# Patient Record
Sex: Male | Born: 1940
Health system: Southern US, Community
[De-identification: ages and names within clinical notes are randomized; demographics above are authoritative.]

## PROBLEM LIST (undated history)

## (undated) DIAGNOSIS — G4730 Sleep apnea, unspecified: Secondary | ICD-10-CM

## (undated) DIAGNOSIS — K649 Unspecified hemorrhoids: Secondary | ICD-10-CM

## (undated) DIAGNOSIS — E7800 Pure hypercholesterolemia, unspecified: Secondary | ICD-10-CM

## (undated) DIAGNOSIS — Z9889 Other specified postprocedural states: Secondary | ICD-10-CM

## (undated) DIAGNOSIS — K449 Diaphragmatic hernia without obstruction or gangrene: Secondary | ICD-10-CM

## (undated) DIAGNOSIS — K439 Ventral hernia without obstruction or gangrene: Secondary | ICD-10-CM

## (undated) DIAGNOSIS — R109 Unspecified abdominal pain: Secondary | ICD-10-CM

## (undated) DIAGNOSIS — J329 Chronic sinusitis, unspecified: Secondary | ICD-10-CM

## (undated) DIAGNOSIS — I10 Essential (primary) hypertension: Secondary | ICD-10-CM

## (undated) DIAGNOSIS — M1990 Unspecified osteoarthritis, unspecified site: Secondary | ICD-10-CM

## (undated) DIAGNOSIS — K635 Polyp of colon: Secondary | ICD-10-CM

## (undated) DIAGNOSIS — I442 Atrioventricular block, complete: Secondary | ICD-10-CM

## (undated) DIAGNOSIS — R413 Other amnesia: Secondary | ICD-10-CM

## (undated) DIAGNOSIS — K219 Gastro-esophageal reflux disease without esophagitis: Secondary | ICD-10-CM

## (undated) DIAGNOSIS — H3530 Unspecified macular degeneration: Secondary | ICD-10-CM

## (undated) DIAGNOSIS — I2510 Atherosclerotic heart disease of native coronary artery without angina pectoris: Secondary | ICD-10-CM

## (undated) DIAGNOSIS — R7302 Impaired glucose tolerance (oral): Secondary | ICD-10-CM

## (undated) DIAGNOSIS — R9439 Abnormal result of other cardiovascular function study: Secondary | ICD-10-CM

## (undated) DIAGNOSIS — Z950 Presence of cardiac pacemaker: Secondary | ICD-10-CM

## (undated) DIAGNOSIS — K5790 Diverticulosis of intestine, part unspecified, without perforation or abscess without bleeding: Secondary | ICD-10-CM

## (undated) DIAGNOSIS — I493 Ventricular premature depolarization: Secondary | ICD-10-CM

## (undated) HISTORY — DX: Unspecified hemorrhoids: K64.9

## (undated) HISTORY — DX: Other specified postprocedural states: Z98.890

## (undated) HISTORY — PX: APPENDECTOMY: SHX54

## (undated) HISTORY — DX: Impaired glucose tolerance (oral): R73.02

## (undated) HISTORY — DX: Essential (primary) hypertension: I10

## (undated) HISTORY — PX: COLONOSCOPY: SHX174

## (undated) HISTORY — DX: Presence of cardiac pacemaker: Z95.0

## (undated) HISTORY — DX: Unspecified macular degeneration: H35.30

## (undated) HISTORY — DX: Polyp of colon: K63.5

## (undated) HISTORY — DX: Chronic sinusitis, unspecified: J32.9

## (undated) HISTORY — PX: CPAP: SHX449

## (undated) HISTORY — DX: Ventricular premature depolarization: I49.3

## (undated) HISTORY — DX: Atrioventricular block, complete: I44.2

## (undated) HISTORY — PX: EYE SURGERY: SHX253

## (undated) HISTORY — DX: Ventral hernia without obstruction or gangrene: K43.9

## (undated) HISTORY — DX: Diverticulosis of intestine, part unspecified, without perforation or abscess without bleeding: K57.90

## (undated) HISTORY — DX: Diaphragmatic hernia without obstruction or gangrene: K44.9

## (undated) HISTORY — DX: Unspecified abdominal pain: R10.9

## (undated) HISTORY — DX: Other amnesia: R41.3

## (undated) HISTORY — DX: Abnormal result of other cardiovascular function study: R94.39

## (undated) HISTORY — DX: Atherosclerotic heart disease of native coronary artery without angina pectoris: I25.10

## (undated) HISTORY — PX: TONSILLECTOMY: SUR1361

## (undated) HISTORY — DX: Pure hypercholesterolemia, unspecified: E78.00

## (undated) HISTORY — PX: REFRACTIVE SURGERY: SHX103

## (undated) HISTORY — DX: Sleep apnea, unspecified: G47.30

## (undated) HISTORY — DX: Gastro-esophageal reflux disease without esophagitis: K21.9

## (undated) HISTORY — PX: NOSE SURGERY: SHX723

---

## 1999-05-14 ENCOUNTER — Ambulatory Visit (HOSPITAL_COMMUNITY): Admission: RE | Admit: 1999-05-14 | Discharge: 1999-05-14 | Payer: Self-pay | Admitting: Neurosurgery

## 1999-05-14 ENCOUNTER — Encounter: Payer: Self-pay | Admitting: Neurosurgery

## 2000-03-16 ENCOUNTER — Encounter: Admission: RE | Admit: 2000-03-16 | Discharge: 2000-03-16 | Payer: Self-pay | Admitting: Internal Medicine

## 2000-03-16 ENCOUNTER — Encounter: Payer: Self-pay | Admitting: Internal Medicine

## 2000-09-12 ENCOUNTER — Encounter: Payer: Self-pay | Admitting: Internal Medicine

## 2000-09-12 ENCOUNTER — Encounter: Admission: RE | Admit: 2000-09-12 | Discharge: 2000-09-12 | Payer: Self-pay | Admitting: Internal Medicine

## 2002-11-02 ENCOUNTER — Encounter: Admission: RE | Admit: 2002-11-02 | Discharge: 2002-11-02 | Payer: Self-pay | Admitting: Internal Medicine

## 2002-11-02 ENCOUNTER — Encounter: Payer: Self-pay | Admitting: Internal Medicine

## 2002-11-08 ENCOUNTER — Encounter: Payer: Self-pay | Admitting: Nurse Practitioner

## 2003-02-21 ENCOUNTER — Ambulatory Visit (HOSPITAL_COMMUNITY): Admission: RE | Admit: 2003-02-21 | Discharge: 2003-02-21 | Payer: Self-pay | Admitting: Gastroenterology

## 2003-02-21 ENCOUNTER — Encounter (INDEPENDENT_AMBULATORY_CARE_PROVIDER_SITE_OTHER): Payer: Self-pay | Admitting: Specialist

## 2005-07-29 HISTORY — PX: CARDIAC CATHETERIZATION: SHX172

## 2005-07-30 ENCOUNTER — Inpatient Hospital Stay (HOSPITAL_BASED_OUTPATIENT_CLINIC_OR_DEPARTMENT_OTHER): Admission: RE | Admit: 2005-07-30 | Discharge: 2005-07-30 | Payer: Self-pay | Admitting: Cardiology

## 2005-10-14 ENCOUNTER — Encounter: Admission: RE | Admit: 2005-10-14 | Discharge: 2005-10-14 | Payer: Self-pay | Admitting: Neurosurgery

## 2005-11-23 ENCOUNTER — Encounter: Admission: RE | Admit: 2005-11-23 | Discharge: 2005-11-23 | Payer: Self-pay | Admitting: Internal Medicine

## 2005-11-25 ENCOUNTER — Encounter: Payer: Self-pay | Admitting: Nurse Practitioner

## 2005-11-25 ENCOUNTER — Encounter (INDEPENDENT_AMBULATORY_CARE_PROVIDER_SITE_OTHER): Payer: Self-pay | Admitting: *Deleted

## 2005-11-25 ENCOUNTER — Inpatient Hospital Stay (HOSPITAL_COMMUNITY): Admission: EM | Admit: 2005-11-25 | Discharge: 2005-11-26 | Payer: Self-pay | Admitting: Emergency Medicine

## 2006-01-02 HISTORY — PX: OTHER SURGICAL HISTORY: SHX169

## 2006-01-24 ENCOUNTER — Encounter (INDEPENDENT_AMBULATORY_CARE_PROVIDER_SITE_OTHER): Payer: Self-pay | Admitting: Specialist

## 2006-01-24 ENCOUNTER — Inpatient Hospital Stay (HOSPITAL_COMMUNITY): Admission: RE | Admit: 2006-01-24 | Discharge: 2006-01-28 | Payer: Self-pay | Admitting: Urology

## 2006-02-11 ENCOUNTER — Encounter: Admission: RE | Admit: 2006-02-11 | Discharge: 2006-02-11 | Payer: Self-pay | Admitting: Internal Medicine

## 2006-03-01 ENCOUNTER — Encounter: Admission: RE | Admit: 2006-03-01 | Discharge: 2006-03-01 | Payer: Self-pay | Admitting: Internal Medicine

## 2007-01-12 ENCOUNTER — Encounter: Admission: RE | Admit: 2007-01-12 | Discharge: 2007-02-08 | Payer: Self-pay | Admitting: Cardiology

## 2007-07-28 IMAGING — CR DG CHEST 2V
2 series · 2 of 2 positions shown · non-contrast
Comparison: None.

CLINICAL DATA: Right frontal headaches and sinus congestion.  Tachycardia and chest congestion.
 CHEST ? 2 VIEW:

[view not recorded (1 of 2)]
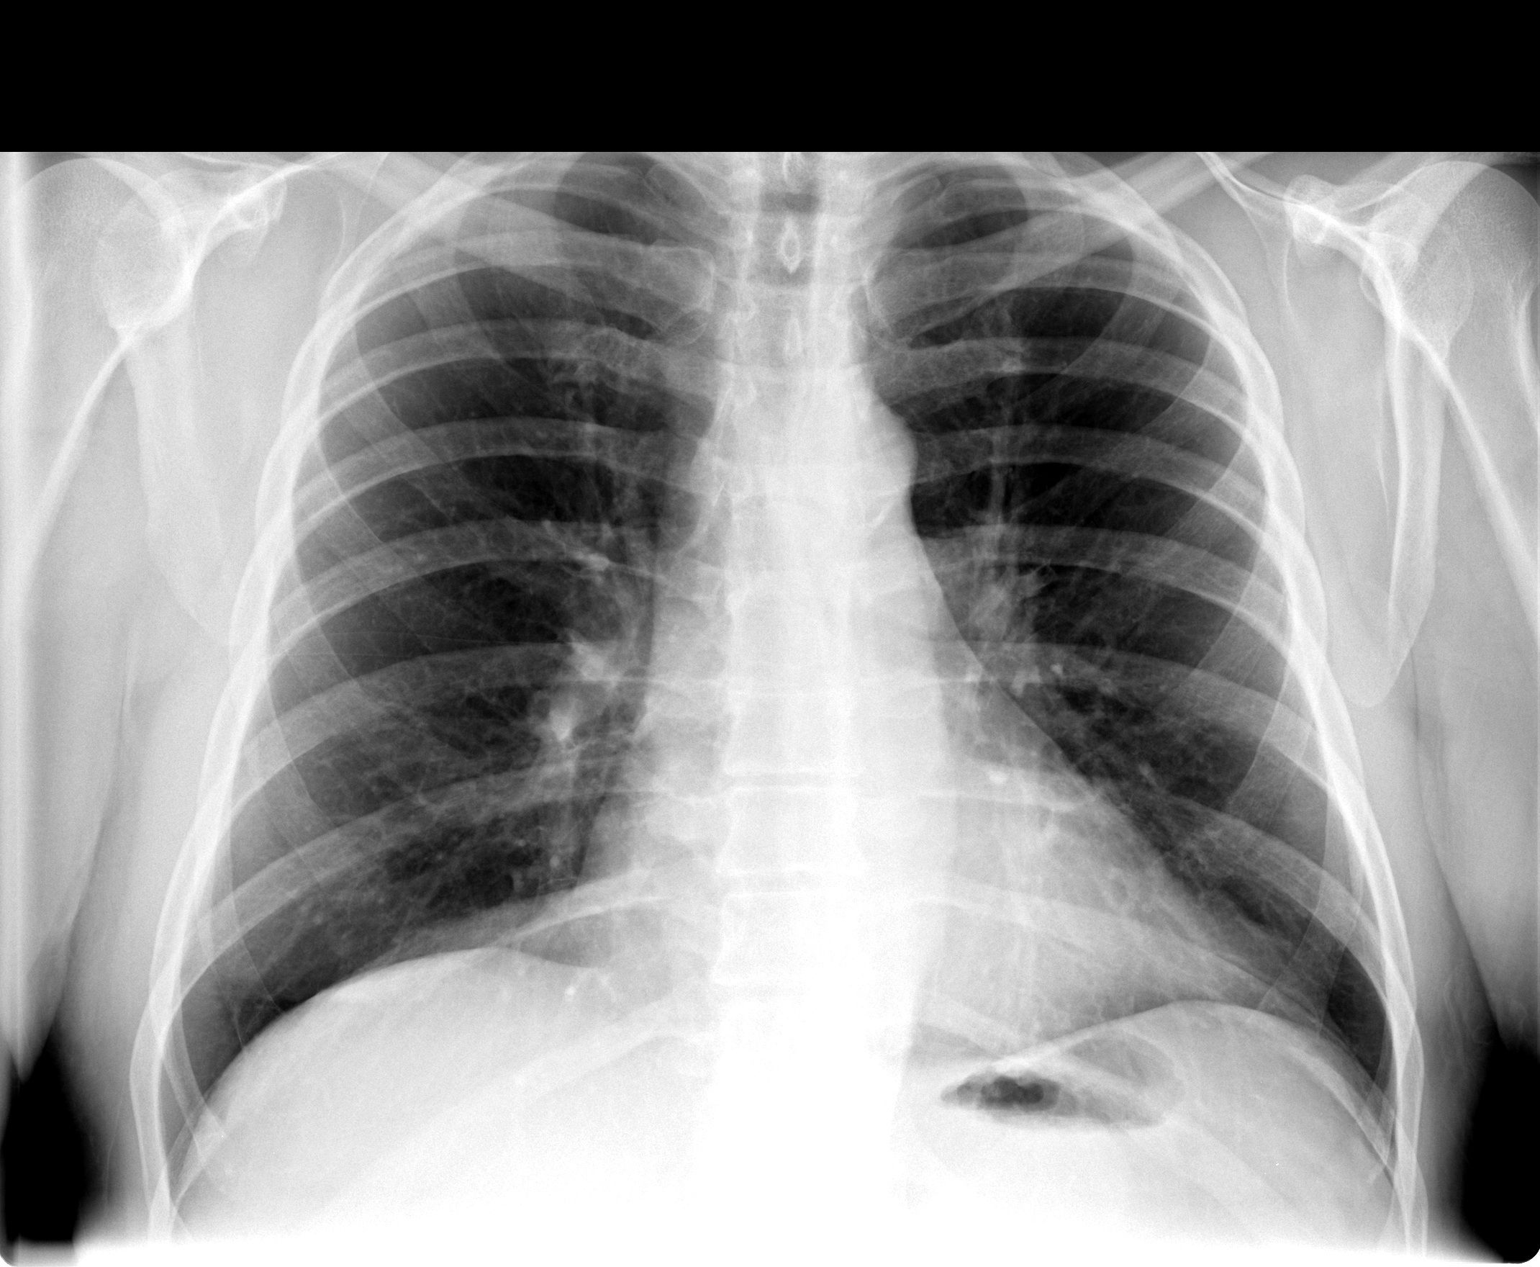

[view not recorded (2 of 2)]
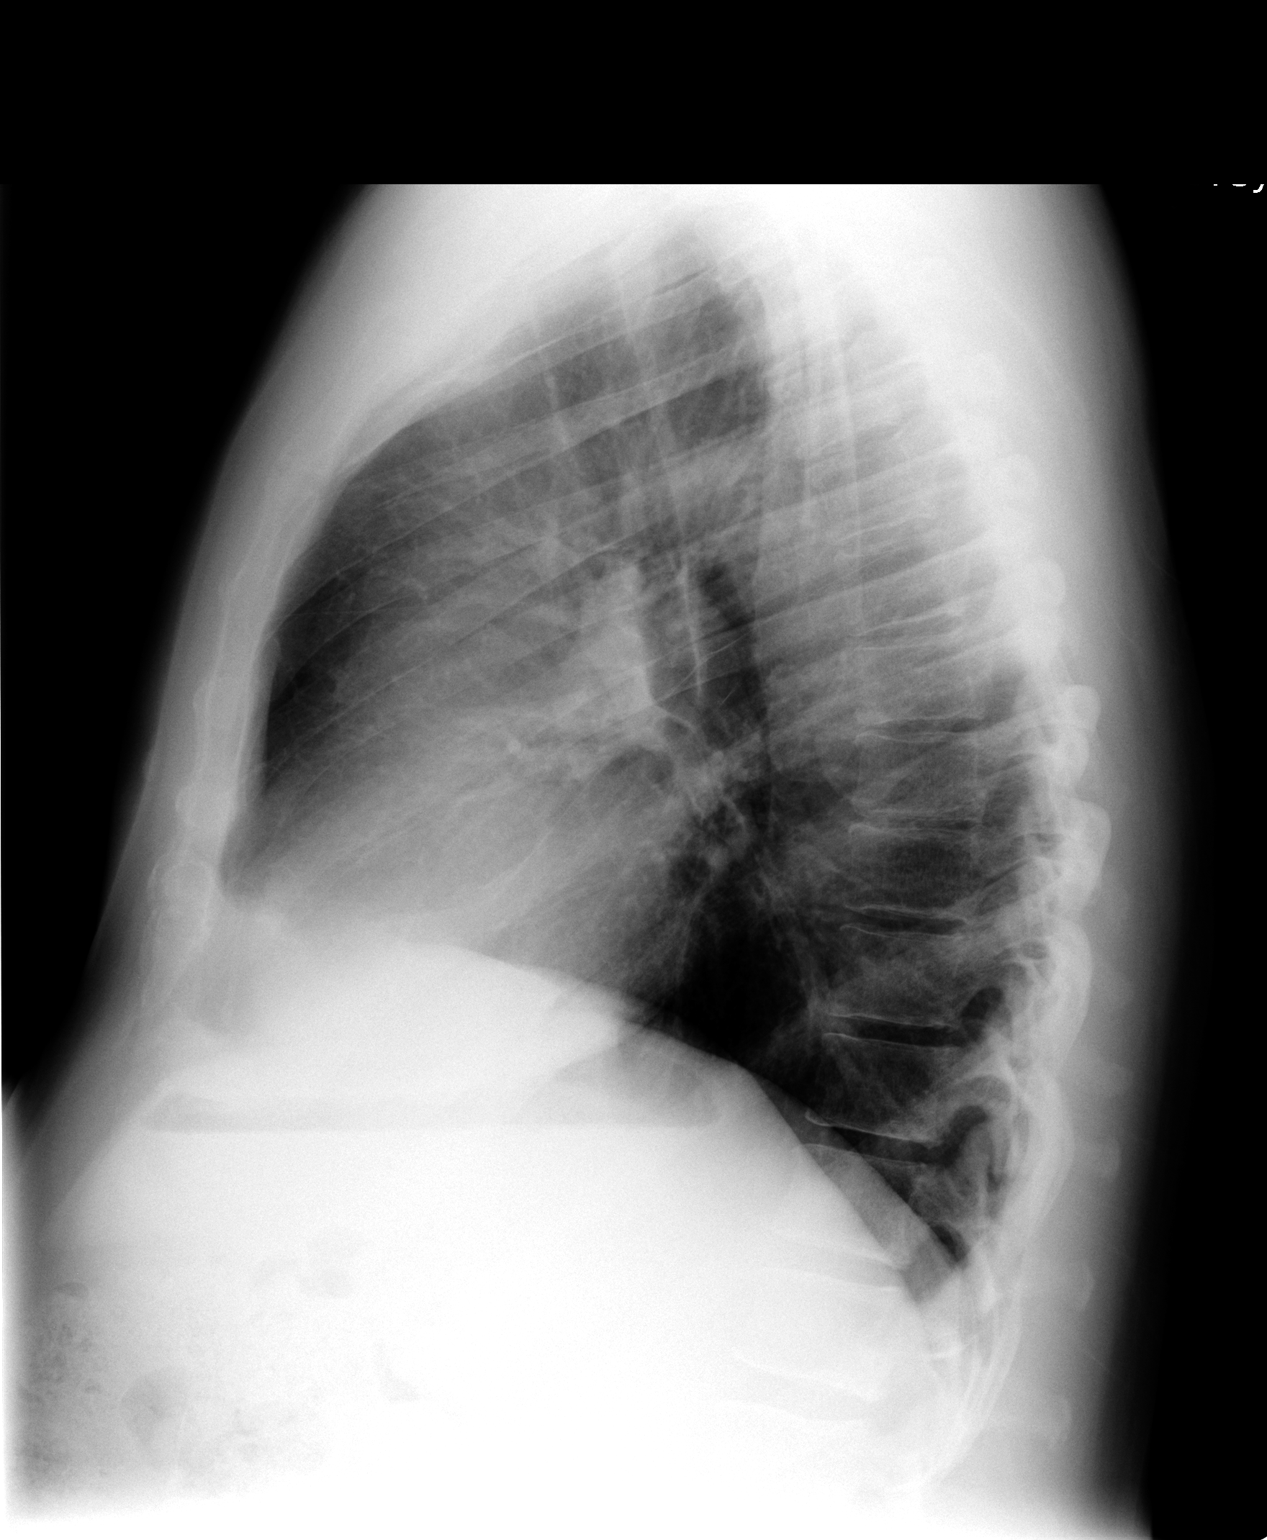

[2 of 2 positions shown; findings below may reference images not displayed]

FINDINGS: The cardiomediastinal contours are normal.  The lungs are clear.  There is no pleural effusion or pneumothorax.  No osseous abnormalities are apparent.
IMPRESSION: No active cardiopulmonary process. 
 DIAGNOSTIC PARANASAL SINUSES ? 3 VIEW:
 The paranasal sinuses are aerated.  There is no evidence of sinus opacification, air-fluid levels, or mucosal thickening.  No significant bone abnormalities are seen.
IMPRESSION: Negative.

## 2008-07-01 ENCOUNTER — Ambulatory Visit: Payer: Self-pay | Admitting: Internal Medicine

## 2009-04-30 ENCOUNTER — Encounter: Payer: Self-pay | Admitting: Nurse Practitioner

## 2009-11-02 DIAGNOSIS — I442 Atrioventricular block, complete: Secondary | ICD-10-CM

## 2009-11-02 HISTORY — DX: Atrioventricular block, complete: I44.2

## 2009-11-05 ENCOUNTER — Encounter: Admission: RE | Admit: 2009-11-05 | Discharge: 2009-11-05 | Payer: Self-pay | Admitting: Cardiology

## 2009-11-14 ENCOUNTER — Encounter: Admission: RE | Admit: 2009-11-14 | Discharge: 2009-11-14 | Payer: Self-pay | Admitting: Cardiology

## 2009-11-18 ENCOUNTER — Inpatient Hospital Stay (HOSPITAL_COMMUNITY): Admission: RE | Admit: 2009-11-18 | Discharge: 2009-11-19 | Payer: Self-pay | Admitting: Cardiology

## 2009-11-18 HISTORY — PX: PACEMAKER INSERTION: SHX728

## 2009-11-18 HISTORY — PX: INSERT / REPLACE / REMOVE PACEMAKER: SUR710

## 2010-01-15 ENCOUNTER — Ambulatory Visit: Payer: Self-pay | Admitting: Internal Medicine

## 2010-01-29 ENCOUNTER — Ambulatory Visit: Payer: Self-pay | Admitting: Internal Medicine

## 2010-02-25 ENCOUNTER — Ambulatory Visit: Payer: Self-pay | Admitting: Cardiology

## 2010-03-21 ENCOUNTER — Encounter: Payer: Self-pay | Admitting: Internal Medicine

## 2010-03-25 ENCOUNTER — Ambulatory Visit: Payer: Self-pay | Admitting: Cardiology

## 2010-04-23 ENCOUNTER — Ambulatory Visit: Payer: Self-pay | Admitting: Internal Medicine

## 2010-07-26 ENCOUNTER — Encounter: Payer: Self-pay | Admitting: Neurosurgery

## 2010-08-04 NOTE — Miscellaneous (Signed)
Summary: Device preload  Clinical Lists Changes  Observations: Added new observation of PPM INDICATN: Brady (03/21/2010 13:51) Added new observation of MAGNET RTE: BOL 85 ERI 65 (03/21/2010 13:51) Added new observation of PPMLEADSTAT2: active (03/21/2010 13:51) Added new observation of PPMLEADSER2: 601093  (03/21/2010 13:51) Added new observation of PPMLEADMOD2: 4470  (03/21/2010 13:51) Added new observation of PPMLEADDOI2: 11/18/2009  (03/21/2010 13:51) Added new observation of PPMLEADLOC2: RV  (03/21/2010 13:51) Added new observation of PPMLEADSTAT1: active  (03/21/2010 13:51) Added new observation of PPMLEADSER1: 235573  (03/21/2010 13:51) Added new observation of PPMLEADMOD1: 4469  (03/21/2010 13:51) Added new observation of PPMLEADDOI1: 11/18/2009  (03/21/2010 13:51) Added new observation of PPMLEADLOC1: RA  (03/21/2010 13:51) Added new observation of PPM IMP MD: Roger Shelter, MD  (03/21/2010 13:51) Added new observation of PPM DOI: 11/18/2009  (03/21/2010 13:51) Added new observation of PPM SERL#: [id number]  (03/21/2010 13:51) Added new observation of PPM MODL#: [id number]  (03/21/2010 62:37) Added new observation of PACEMAKERMFG: [employer]onic  (03/21/2010 13:51) Added new observation of PPM REFER MD: Roger Shelter, MD  (03/21/2010 13:51) Added new observation of PACEMAKER MD: Hillis Range, MD  (03/21/2010 13:51)      PPM Specifications Following MD:  Hillis Range, MD     Referring MD:  Roger Shelter, MD PPM Vendor:  [employer]onic     PPM Model Number:  [id number]     PPM Serial Number:  [id number] PPM DOI:  11/18/2009     PPM Implanting MD:  Roger Shelter, MD  Lead 1    Location: RA     DOI: 11/18/2009     Model #: [id number]     Serial #: [id number]     Status: active Lead 2    Location: RV     DOI: 11/18/2009     Model #: [id number]     Serial #: [id number]     Status: active  Magnet Response Rate:  BOL 85 ERI 65  Indications:  Huston Foley

## 2010-08-31 ENCOUNTER — Ambulatory Visit (INDEPENDENT_AMBULATORY_CARE_PROVIDER_SITE_OTHER): Payer: Medicare Other | Admitting: Cardiology

## 2010-08-31 DIAGNOSIS — I2510 Atherosclerotic heart disease of native coronary artery without angina pectoris: Secondary | ICD-10-CM

## 2010-08-31 DIAGNOSIS — I442 Atrioventricular block, complete: Secondary | ICD-10-CM

## 2010-09-11 ENCOUNTER — Telehealth (INDEPENDENT_AMBULATORY_CARE_PROVIDER_SITE_OTHER): Payer: Self-pay | Admitting: *Deleted

## 2010-09-14 ENCOUNTER — Ambulatory Visit (HOSPITAL_BASED_OUTPATIENT_CLINIC_OR_DEPARTMENT_OTHER): Payer: Medicare Other

## 2010-09-14 ENCOUNTER — Encounter: Payer: Self-pay | Admitting: Cardiology

## 2010-09-14 ENCOUNTER — Encounter: Payer: Self-pay | Admitting: Nurse Practitioner

## 2010-09-14 ENCOUNTER — Ambulatory Visit (HOSPITAL_COMMUNITY): Payer: Medicare Other | Attending: Cardiology

## 2010-09-14 DIAGNOSIS — I4949 Other premature depolarization: Secondary | ICD-10-CM

## 2010-09-14 DIAGNOSIS — I10 Essential (primary) hypertension: Secondary | ICD-10-CM | POA: Insufficient documentation

## 2010-09-14 DIAGNOSIS — I2510 Atherosclerotic heart disease of native coronary artery without angina pectoris: Secondary | ICD-10-CM

## 2010-09-14 DIAGNOSIS — R002 Palpitations: Secondary | ICD-10-CM | POA: Insufficient documentation

## 2010-09-14 DIAGNOSIS — I447 Left bundle-branch block, unspecified: Secondary | ICD-10-CM | POA: Insufficient documentation

## 2010-09-14 DIAGNOSIS — I491 Atrial premature depolarization: Secondary | ICD-10-CM

## 2010-09-15 DIAGNOSIS — R9439 Abnormal result of other cardiovascular function study: Secondary | ICD-10-CM

## 2010-09-15 HISTORY — DX: Abnormal result of other cardiovascular function study: R94.39

## 2010-09-15 NOTE — Progress Notes (Signed)
Summary: Nuclear Pre-Procedure  Phone Note Outgoing Call Call back at [facility] Phone [phone]   Call placed by: Stanton Kidney, EMT-P,  September 11, 2010 3:13 PM Call placed to: Patient Action Taken: Phone Call Completed Summary of Call: Reviewed information on Myoview Information Sheet (see scanned document for further details).  Spoke with the patient and his wife. Stanton Kidney, EMT-P  September 11, 2010 3:13 PM      Nuclear Med Background Indications for Stress Test: Evaluation for Ischemia   History: Echo, Heart Catheterization, Myocardial Perfusion Study, Pacemaker  History Comments: '07 Heart Cath: EF=50-55%, N/O CAD '07 MPS: EF=48%, ABN '10 Echo: EF=50-55% 5/11 Pacemaker  Symptoms: Palpitations    Nuclear Pre-Procedure Cardiac Risk Factors: History of Smoking, Hypertension, LBBB

## 2010-09-21 LAB — SURGICAL PCR SCREEN: MRSA, PCR: NEGATIVE

## 2010-09-22 NOTE — Assessment & Plan Note (Signed)
Summary: Cardiology Nuclear Testing  Nuclear Med Background Indications for Stress Test: Evaluation for Ischemia   History: Echo, Heart Catheterization, Myocardial Perfusion Study, Pacemaker  History Comments: '07 Cath:n/o CAD, EF=50-55%; '07 MPS: abnormal, EF=48%; '10 Echo:EF=50-55%; '11 PTVP; h/o PAF   Symptoms Comments: No cardiac complaints   Nuclear Pre-Procedure Cardiac Risk Factors: History of Smoking, Hypertension, LBBB, Lipids Caffeine/Decaff Intake: None NPO After: 8:30 PM Lungs: Clear.  O2 sat 98% on RA. IV 0.9% NS with Angio Cath: 20g     IV Site: R Antecubital IV Started by: Irean Hong, RN Chest Size (in) 42     Height (in): 70 Weight (lb): 186 BMI: 26.78 Tech Comments: Lopressor held this a.m.  Nuclear Med Study 1 or 2 day study:  1 day     Stress Test Type:  Adenosine Reading MD:  Olga Millers, MD     Referring MD:  Roger Shelter, MD Resting Radionuclide:  Technetium 24m Tetrofosmin     Resting Radionuclide Dose:  11 mCi  Stress Radionuclide:  Technetium 23m Tetrofosmin     Stress Radionuclide Dose:  33 mCi   Stress Protocol  Dose of Adenosine:  47.3 mg    Stress Test Technologist:  [employer]a [employer], CMA-N     Nuclear Technologist:  Domenic Polite, CNMT  Rest Procedure  Myocardial perfusion imaging was performed at rest 45 minutes following the intravenous administration of Technetium 70m Tetrofosmin.  Stress Procedure  The patient received IV adenosine at 140 mcg/kg/min for 4 minutes. There was a brief episode of 2nd degree AVB with infusion. Technetium 80m Tetrofosmin was injected at the 2 minute mark and quantitative spect images were obtained after a 45 minute delay.  QPS Raw Data Images:  Acquisition technically good; normal left ventricular size. Stress Images:  There is decreased uptake in the septum. Rest Images:  There is decreased uptake in the septumn less prominent compared to the stress images. Subtraction (SDS):  These findings are  consistent with previous infarct vs septal defect secondary to LBBB; mild septal ischemia. Transient Ischemic Dilatation:  .97  (Normal <1.22)  Lung/Heart Ratio:  .32  (Normal <0.45)  Quantitative Gated Spect Images QGS cine images:  non-gated study   Overall Impression  Exercise Capacity: Adenosine study with no exercise. BP Response: Normal blood pressure response. Clinical Symptoms: No chest pain ECG Impression: Uninterpretable due to LBBB and intermittent ventricular pacing. Overall Impression: Abnormal adenosine nuclear study with septal defect secondary to previous infarct vs LBBB; mild septal ischemia as well.

## 2010-11-03 ENCOUNTER — Other Ambulatory Visit: Payer: Self-pay | Admitting: *Deleted

## 2010-11-03 DIAGNOSIS — E7800 Pure hypercholesterolemia, unspecified: Secondary | ICD-10-CM

## 2010-11-04 ENCOUNTER — Encounter: Payer: Self-pay | Admitting: Internal Medicine

## 2010-11-06 ENCOUNTER — Encounter: Payer: Self-pay | Admitting: Internal Medicine

## 2010-11-06 ENCOUNTER — Ambulatory Visit (INDEPENDENT_AMBULATORY_CARE_PROVIDER_SITE_OTHER): Payer: Medicare Other | Admitting: Internal Medicine

## 2010-11-06 DIAGNOSIS — I441 Atrioventricular block, second degree: Secondary | ICD-10-CM

## 2010-11-06 DIAGNOSIS — I498 Other specified cardiac arrhythmias: Secondary | ICD-10-CM

## 2010-11-06 DIAGNOSIS — I4891 Unspecified atrial fibrillation: Secondary | ICD-10-CM

## 2010-11-06 DIAGNOSIS — I442 Atrioventricular block, complete: Secondary | ICD-10-CM | POA: Insufficient documentation

## 2010-11-06 DIAGNOSIS — I10 Essential (primary) hypertension: Secondary | ICD-10-CM

## 2010-11-06 NOTE — Assessment & Plan Note (Signed)
Normal pacemaker function See Pace Art report No changes today  

## 2010-11-06 NOTE — Patient Instructions (Signed)
Your physician wants you to follow-up in: 6 months with Dr. Allred. You will receive a reminder letter in the mail two months in advance. If you don't receive a letter, please call our office to schedule the follow-up appointment.  

## 2010-11-06 NOTE — Assessment & Plan Note (Signed)
Pacemaker interrogation today reveals no afib.  We will monitor and consider coumadin/ pradaxa if he is found to have increasing burden of afib.

## 2010-11-06 NOTE — Progress Notes (Signed)
Robert Ramsey is a pleasant 70 y.o. yo patient with a h/o mobitz II second degree AV block sp PPM (MDT) by Dr Deborah Chalk  who presents today to establish care in the Electrophysiology device clinic.   The patient reports doing very well since having a pacemaker implanted and remains very active despite his age.   Today, he  denies symptoms of palpitations, chest pain, shortness of breath, orthopnea, PND, lower extremity edema, dizziness, presyncope, syncope, or neurologic sequela.  The patientis tolerating medications without difficulties and is otherwise without complaint today.   Past Medical History  Diagnosis Date  . CAD (coronary artery disease)     cath in 2007 reveals mild diffuse CAD  . HTN (hypertension)   . Mobitz (type) II atrioventricular block     chronic  . Ventral hernia 11/18/09    s/p PPM (MDT) by Dr Deborah Chalk  . Paroxysmal atrial fibrillation   . History of colonoscopy   . Memory impairment     Past Surgical History  Procedure Date  . Right nephrectomy 01/2006    partial for mass  . Cataract surgery 06/2009    History   Social History  . Marital Status: Married    Spouse Name: N/A    Number of Children: N/A  . Years of Education: N/A   Occupational History  . Retired    Social History Main Topics  . Smoking status: Former Games developer  . Smokeless tobacco: Not on file   Comment: stopped smoking in 1985  . Alcohol Use: No  . Drug Use: No  . Sexually Active: Not on file   Other Topics Concern  . Not on file   Social History Narrative   Retired from Environmental health practitioner.    Family History  Problem Relation Age of Onset  . Heart failure Father   . Leukemia Mother     Allergies  Allergen Reactions  . Cefpodoxime Proxetil     REACTION: Colitis  . Nabumetone     REACTION: Heart issues  . Naproxen     REACTION: Reaction not known  . Nsaids     REACTION: Reaction not known    Current Outpatient Prescriptions  Medication Sig Dispense Refill  . BABY  ASPIRIN PO Take by mouth daily.        Marland Kitchen docusate sodium (COLACE) 100 MG capsule Take 100 mg by mouth 2 (two) times daily.        Marland Kitchen ezetimibe (ZETIA) 10 MG tablet Take 10 mg by mouth daily.        . metoprolol tartrate (LOPRESSOR) 25 MG tablet Take 25 mg by mouth 2 (two) times daily.        . Multiple Vitamin (MULTIVITAMIN) tablet Take 1 tablet by mouth daily.        Marland Kitchen omeprazole (PRILOSEC) 20 MG capsule Take 20 mg by mouth daily.        . rosuvastatin (CRESTOR) 5 MG tablet Take 2.5 mg by mouth daily.        . solifenacin (VESICARE) 10 MG tablet Take 5 mg by mouth daily.         ROS- all systems are reviewed and negative except as per HPI  Physical Exam: Filed Vitals:   11/06/10 1116  BP: 126/80  Pulse: 73  Height: 5\' 9"  (1.753 m)  Weight: 189 lb (85.73 kg)    GEN- The patient is well appearing, alert and oriented x 3 today.   Head- normocephalic, atraumatic Eyes-  Sclera clear, conjunctiva  pink Ears- hearing intact Oropharynx- clear Neck- supple, no JVP Lymph- no cervical lymphadenopathy Lungs- Clear to ausculation bilaterally, normal work of breathing Chest- pacemaker pocket is well healed Heart- Regular rate and rhythm, no murmurs, rubs or gallops, PMI not laterally displaced GI- soft, NT, ND, + BS Extremities- no clubbing, cyanosis, or edema MS- no significant deformity or atrophy Skin- no rash or lesion Psych- euthymic mood, full affect Neuro- strength and sensation are intact  Pacemaker interrogation- reviewed in detail today,  See PACEART report  Assessment and Plan:

## 2010-11-06 NOTE — Assessment & Plan Note (Signed)
Stable No change required today  

## 2010-11-10 ENCOUNTER — Encounter: Payer: Self-pay | Admitting: Internal Medicine

## 2010-11-17 ENCOUNTER — Other Ambulatory Visit: Payer: Self-pay | Admitting: Internal Medicine

## 2010-11-17 ENCOUNTER — Telehealth: Payer: Self-pay | Admitting: Internal Medicine

## 2010-11-17 NOTE — Telephone Encounter (Signed)
Received equipment thru mail to ck his device with.  Was told to call and do test once received. Please call patient to do this test.

## 2010-11-17 NOTE — Telephone Encounter (Signed)
Patient to send tranmission at will for set up.

## 2010-11-20 NOTE — H&P (Signed)
Robert Ramsey, Robert Ramsey               ACCOUNT NO.:  [id number]   MEDICAL RECORD NO.:  [id number]          PATIENT TYPE:  INP   LOCATION:  0009                         FACILITY:  [facility]   PHYSICIAN:  Mark C. Vernie Ammons, M.D.  DATE OF BIRTH:  [birth year]   DATE OF ADMISSION:  01/24/2006  DATE OF DISCHARGE:                                HISTORY & PHYSICAL   CHIEF COMPLAINT:  Right renal mass.   HISTORY OF PRESENT ILLNESS:  The patient is a 70 year old white male patient  of Dr. Lenord Fellers, whom I saw on office consultation for further evaluation of  an incidentally-noted the right renal mass found on CT scan of the chest.  He had had a slight abnormality detected on a chest x-ray.  A CT scan of the  chest revealed this to be a benign finding; however, upper cuts of the  kidney revealed a right renal mass.  This was worked up with a follow-up CT  scan performed with and without contrast, which showed a lesion in the right  midkidney that reveals enhancement pattern compatible with a neoplasm.  The  patient has been completely asymptomatic with no hematuria or flank pain or  other urologic complaint.  He has a known history of a prostate nodule that  has been followed for years with is benign with no change and a normal PSA  and also has had some urinary frequency, which has been managed with  anticholinergics.  He is admitted electively at this time for surgical  treatment of his right renal mass.   PAST MEDICAL HISTORY:  Positive for hypertension, gastroesophageal reflux.   SURGICAL HISTORY:  He has had an appendectomy and he has had two prior  cardiac catheterizations.   MEDICATIONS:  1.  Vesicare 10 mg.  2.  Lisinopril 5 mg.  3.  Zetia 10 mg.  4.  Metoprolol 12.5 mg a.m. and 12.5 mg p.m.  5.  Crestor 5 mg.  6.  Multivitamin.  7.  He stopped his aspirin.   ALLERGIES:  VANTIN, VIOXX, RELAFEN, NAPROSYN and NEOSPORIN.   SOCIAL HISTORY:  He used to smoke a pipe but quit in 1985.  Denies  alcohol  use.   FAMILY HISTORY:  Father died of cardiopulmonary disease at 23, mother of  leukemia at 104.   REVIEW OF SYSTEMS:  Per admission health history section II, which was  reviewed and signed.   PHYSICAL EXAMINATION:  VITAL SIGNS:  Temperature is 97.5, pulse 73, blood  pressure is 124/80.  GENERAL:  The patient is a well-developed, well-nourished white male in no  apparent distress.  HEENT:  Atraumatic, normocephalic.  Oropharynx clear.  NECK:  Supple with midline trachea.  CHEST:  Normal respiratory effort.  CARDIOVASCULAR:  Regular rate and rhythm.  ABDOMEN:  Soft and nontender without mass or HSM.  He has no CVAT.  He has  no inguinal hernias or adenopathy.  GENITOURINARY:  He has a normal phallus with a normal glans.  There is some  hyperpigmentation of the glans, but this is unchanged per son for many  years.  The meatus  is normal.  Scrotum, testicles, epididymis are normal as  well.  RECTAL:  He has normal rectal tone.  His prostate is slightly enlarged but  smooth and symmetric.  It is a little firmer over on the right-hand side  than the left but no definite nodularity or worrisome findings.  No seminal  vesicle abnormality.  SKIN:  Warm and dry.  EXTREMITIES:  Without clubbing, cyanosis or edema.  NEUROLOGIC:  He is alert and oriented, has no gross focal neurologic  deficit.   LABORATORY RESULTS:  White count 5.6, hemoglobin 14.1, hematocrit 42.3,  platelet 222.  His electrolytes were normal with a creatinine of 1.1.  Liver  function tests were normal as well.  PT 13.1, INR 1.0, PTT 30.  Urinalysis  clear.   His chest CT as noted above is negative.   EKG is normal sinus rhythm with some left axis deviation, left bundle branch  block, with no significant change from his previous tracing in on Nov 25, 2005.   CT scan of his abdomen revealed a 12 mm lesion in the upper posterior aspect  of the right kidney with normal function bilaterally and  normal-appearing  kidneys bilaterally.  No evidence of adenopathy or renal vein involvement.   IMPRESSION:  1.  Benign prostatic hypertrophy by exam.  2.  Mild irritative voiding symptoms.  3.  History of prostate nodule with a normal PSA.  4.  Incidentally-noted right renal neoplasm.  We have discussed the      probability that this is a malignant lesion, although it is possible      that this is a benign lesion.  It is peripherally located and amenable      to a partial nephrectomy.  It is in a location that would not be      accessible laparoscopically.  I have gone over the procedure, the risks      and complications with the patient.  He understands and has elected to      proceed with a right partial nephrectomy.   PLAN:  1.  Right partial nephrectomy.  2.  DVT prophylaxis including preoperative subcu heparin 500 units, PAS      hose, and TED stockings on the patient in the operating room.  3.  Perioperative antibiotics.      Mark C. Vernie Ammons, M.D.  Electronically Signed     MCO/MEDQ  D:  01/24/2006  T:  01/24/2006  Job:  [id number]

## 2010-11-20 NOTE — H&P (Signed)
NAMEASTOR, GENTLE NO.:  [id number]   MEDICAL RECORD NO.:  [id number]           PATIENT TYPE:   LOCATION:                                 FACILITY:   PHYSICIAN:  Colleen Can. Deborah Chalk, M.D.    DATE OF BIRTH:   DATE OF ADMISSION:  07/30/2005  DATE OF DISCHARGE:                                HISTORY & PHYSICAL   CHIEF COMPLAINT:  Chest pain.   HISTORY OF PRESENT ILLNESS:  Mr. Robert Ramsey is a 70 year old white male.  He  has chronic history of left bundle branch block.  He has had previous  catheterization that dates back to 15 years ago with minimal coronary  disease documented at that time.  He has had some recurrent episodes of  atypical chest pain that has occurred while getting out of a car.  It has  been very fleeting in nature with some radiation into the arms.  He does  have no cervical spine disease.  He subsequently underwent adenosine  Cardiolite study which showed reduced ejection fraction at 48%.  There was  inferior septal ischemia.  Cardiac catheterization is now recommended.   PAST MEDICAL HISTORY:  1.  Left bundle branch block.  2.  Hypertension.  3.  Hyperlipidemia.  4.  History of left ventricular dyssynergy secondary to left bundle branch      block.  His last 2-D echocardiogram was in May of 2004.  At that time      his ejection fraction was 65-70% with septal dyssynchrony noted.  5.  Remote history of catheterization in 1992 showing normal LV function,      anomalous origin of the left circumflex, and mild somewhat diffuse      disease in the mild proximal LAD.  There was a 50-60% narrowing at the      origin of the posterior descending in the right coronary.   PAST SURGICAL HISTORY:  Nasal septoplasty in approximately 1986.   ALLERGIES:  NAPROSYN, VANTIN.   FAMILY HISTORY:  Father died at 12 of heart disease and had heart failure.  Mother died at 38 from leukemia.   SOCIAL HISTORY:  He is retired from [employer].  He quit smoking in 1985.   He has  no alcohol use.  He lives at home with his wife.   REVIEW OF SYSTEMS:  Basically as noted above.  He has had no recent fever,  flu, or cough.  He does report problems with his memory.  His wife notes  that he has become very forgetful as well.  His chest pain is as described  above.  He has not been short of breath.  He does have some discomfort in  his arms when he is working above his head.  He has had no GI complaints.  He has had a remote history of pseudomembranous colitis.  He has had no  complaints of edema.   PHYSICAL EXAMINATION:  GENERAL:  He is a pleasant, middle-aged white male in  no acute distress.  VITAL SIGNS:  Weight 190 pounds, blood pressure 140/90 sitting, 150/90  standing, heart  rate is 100, respirations 18.  He is afebrile.  SKIN:  Warm and dry.  Color is unremarkable.  LUNGS:  Clear.  HEART:  Regular rhythm.  ABDOMEN:  Soft, positive bowel sounds, nontender.  EXTREMITIES:  Without edema.  NEUROLOGIC:  Intact.  There are no gross focal deficits.   PERTINENT LABORATORIES:  EKG shows left bundle branch block.  Chemistries  are normal.  CBC is normal.  PT and PTT are unremarkable.   OVERALL IMPRESSION:  1.  Chest pain.  2.  Abnormal adenosine Cardiolite study.  3.  Remote history of catheterization with 50-60% narrowing in the right      coronary.  4.  Hypertension.  5.  Hyperlipidemia.  6.  New complaint of short-term memory loss.   PLAN:  Will proceed on with catheterization.  Procedure has been reviewed in  full detail including the risks and benefits and he is willing to proceed on  Friday, July 30, 2005.      Sharlee Blew, N.P.      Colleen Can. Deborah Chalk, M.D.  Electronically Signed    LC/MEDQ  D:  07/28/2005  T:  07/28/2005  Job:  [id number]   cc:   Luanna Cole. Lenord Fellers, M.D.  Fax: [phone]

## 2010-11-20 NOTE — Discharge Summary (Signed)
Robert Ramsey, Robert Ramsey               ACCOUNT NO.:  [id number]   MEDICAL RECORD NO.:  [id number]          PATIENT TYPE:  INP   LOCATION:  1430                         FACILITY:  [facility]   PHYSICIAN:  Mark C. Vernie Ammons, M.D.  DATE OF BIRTH:  11/26/1940   DATE OF ADMISSION:  01/24/2006  DATE OF DISCHARGE:  01/28/2006                                 DISCHARGE SUMMARY   PRINCIPAL DIAGNOSIS:  Right renal oncocytoma.   OTHER DIAGNOSES:  1.  Acute acute blood loss anemia secondary to surgery.  2.  Hypertension.   MAJOR OPERATION:  Right partial nephrectomy.   DISPOSITION:  The patient is discharged home in stable, satisfactory, and  improved condition.  He is tolerating a regular diet.  He had had flatus but  has not had a bowel movement yet.  His abdomen is soft and nondistended.   ACTIVITY UPON DISCHARGE:  Will be limited to no heavy lifting, straining, or  vigorous activity.  No car driving.   DISCHARGE MEDICATIONS:  1.  Will be unchanged from admission.  2.  Addition of Tylox 1-2 q.4 h. p.r.n. pain.  3.  Colace 100 mg b.i.d.  4.  He will use milk of magnesia, magnesium citrate, or Fleet enema p.r.n.  5.  Was recommended to begin a multivitamin with iron.   FOLLOWUP:  Will be in my office in 3-4 days for skin staple removal.   BRIEF HISTORY:  The patient is a 70 year old white male who was seen for an  incidentally noted right renal mass found on CT scan of his chest.  Follow-  up with and without contrasted CT revealed the lesion to be enhancing,  consistent with neoplasm.  He has been asymptomatic.  No other evidence of  metastatic disease was identified.  He therefore was brought to the hospital  for elective partial nephrectomy.   The remainder of his history and physical examination is noted in the  hospital chart and will not be repeated here.   On January 24, 2006, the patient was taken to the operating room, where he  underwent a right partial nephrectomy.  There was a fairly  significant  intraoperative blood loss, and postoperatively his hemoglobin had fallen  from a preop his level of 14 down to 7.5.  He therefore was transfused 2  units of packed red cells.  Chest x-ray in the recovery room showed no  evidence of pneumothorax.  His hemoglobin then returned to 10.4 after the  transfusion, and the following day the patient was noted to be doing well  but was but was running a low blood pressure.  His urine output remained  adequate, and his drain had only minimal output.  Continued monitoring of  his hemoglobin revealed that it had dropped again to 8.6.  He therefore  received 2 more units of red blood cells due to in this slight drop  associated with tachycardia.  His drain output remained approximately 25 cc  per shift.  On January 26, 2006,  his creatinine was noted to be 1.3.  His H&H  was 9.4 and 27.5, respectively.  His pathology returned showing oncocytoma.  I discussed the results with the  patient.  He was begun on a regular diet and continued with ambulation and  pulmonary toilet.  He is PCA was stopped on January 26, 2006.  He was  complaining of some abdominal bloating and was given Dulcolax suppositories  as well as tabs.  He continues to have flatus, but did not have a bowel  movement.  His IV was Hep-Locked, and he was advanced on to a regular diet.  Eventually, his drain output decreased to about 5 cc per 8 hours and was  removed on January 28, 2006.  He continued to do well, ambulating well, with  flatus but no bowel movement.  He was afebrile, had few crackles to the  right base, and was encouraged to continue pulmonary toilette.  Rectal exam  revealed stool in the vault, but it was not large and hard but small.   His skin incision is healing well.  His drain was removed, and the site  appeared without any sign of infection, and he had no significant flank  ecchymoses.  He was therefore felt ready for discharge after enema this  morning.Robert Ramsey C. Vernie Ammons, M.D.  Electronically Signed     MCO/MEDQ  D:  01/28/2006  T:  01/28/2006  Job:  [id number]

## 2010-11-20 NOTE — Op Note (Signed)
Robert Ramsey, Robert Ramsey               ACCOUNT NO.:  [id number]   MEDICAL RECORD NO.:  [id number]          PATIENT TYPE:  INP   LOCATION:  0009                         FACILITY:  [facility]   PHYSICIAN:  Mark C. Vernie Ammons, M.D.  DATE OF BIRTH:  [birth year]   DATE OF PROCEDURE:  01/24/2006  DATE OF DISCHARGE:                                 OPERATIVE REPORT   PREOPERATIVE DIAGNOSIS:  Right renal mass.   POSTOPERATIVE DIAGNOSIS:  Right renal mass.   PROCEDURE:  Right partial nephrectomy.   SURGEON:  Mark C. Vernie Ammons, M.D.   Threasa HeadsLudger Nutting, M.D.   ANESTHESIA:  General.   BLOOD LOSS:  2000 mL.   TRANSFUSIONS:  None.   SPECIMENS:  Right renal mass to pathology (intraoperative frozen section  revealed negative margins).   DRAINS:  A 16-French Foley catheter in the bladder and a #19 Blake drain in  the right flank.   COMPLICATIONS:  None.   INDICATIONS:  The patient is a 70 year old white male with an enhancing 12  mm lesion in the posterior aspect of his right kidney.  Its location as not  amenable to laparoscopy.  He is brought to the OR today for excision of the  lesion.  His workup has otherwise revealed no evidence of any metastatic  disease.  The risks, complications and alternatives been discussed and he  understands and elected to proceed with surgery.   DESCRIPTION OF OPERATION:  After informed consent, the patient brought to  the major OR and placed on the table and administered general anesthesia in  the supine position.  He was administered intravenous antibiotics.  He was  then administered general anesthesia and then placed in the right flank  position and secured to the table.  The right 11th and 12th rib were easily  palpable.  After his flank was sterilely prepped and draped, I made an  incision over the 11th rib and carried this down through external and  internal oblique muscles and incised along the periosteum of the 11th rib.  I then used the periosteal  elevator to elevate the periosteum from the  anterior as well as superior and inferior aspects of the rib.  Leighton Roach was  used then to free the posterior rib from underlying tissue and then the rib  was then grasped at its tip and cut in its most posterior aspect.  I then  divided the transversalis muscle fibers and entered the retroperitoneal  space.  I dissected the diaphragm off the undersurface of the location of  the rib and incised the rib bed with a knife.  I was able to then dissect  the diaphragm and pleural space superiorly and no evidence of pleural space  entrance was identified.  I then further developed the space between the  kidney and the posterior wall as well as between the kidney and peritoneum.  In doing this, a small peritoneotomy was made superiorly.  I was then able  to identify a single renal vein which appeared to have an early branch.  This was dissected free of surrounding tissue and looped  with vessel loops.  I was then able to palpate the artery just inferior to this, cleared off the  tissue surrounding the artery and in doing so, there appeared to be small  arterial rent.  This caused fairly brisk bleeding but was controlled by  placing a right-angle clamp beneath the artery and administering Mannitol  12.5 g IV.  I allowed the Mannitol to circulate for approximately 5 minutes  and then placed a vessel loop around the artery proximally.  This allowed  identification of the small rent in  what appeared to be the posterior  branch of the artery.   With the artery clamped and no bleeding occurring I had directed attention  to the posterior aspect of the kidney where the lesion was palpated.  I  cleared off the surrounding perinephric fat and identified the lesion.  I  scored the capsule circumferentially around this lesion and in doing so, the  capsule appeared to slough off of the lesion.  I therefore sent the capsule  with overlying fat to pathology separately.  I  then scored the renal  parenchyma with the Bovie circumferentially approximately 1 cm surrounding  the lesion and then used the back of knife blade to bluntly dissect the end  of the parenchyma and beneath the lesion.  In doing so, I encountered  several small vessels which were clipped with small hemoclips.  I then freed  the lesion and sent this to pathology.  The frozen section pathological  report revealed that it appeared to possibly be oncocytoma, however, all  surgical margins were negative.   I then used the argon beam coagulator to fulgurate the bed of the partial  nephrectomy site.  FloSeal  was placed on this location as well as a bolster  of Gelfoam.  A 2-0 Vicryl suture was then placed through the capsule and  through the edge of the defect on each side and tied over the bolster.   Attention was then directed to the renal artery.  The defect was easily  identified.  I therefore used 5-0 Prolene suture to close this in a running  fashion.  After doing this, the vessel loop which had been occluding the  artery was released.  No bleeding was identified at that time.  Total  arterial occlusion time was 28 minutes.  After releasing the arterial  tourniquet, the kidney appeared pink and viable.  There was good filling of  the renal vein and reinspection of the site of the lesion excision revealed  no active bleeding or worrisome oozing of any kind.  I closed the  perinephric fat and Gerota's fascia over this with running 2-0 Vicryl  suture.  A small peritoneotomy was identified and closed with running 3-0  chromic.  I then reinspected the area of the hilum and no active bleeding  was identified in this location.  I therefore removed all the retractors,  allowed the kidney to fall back in its normal anatomic position and then  irrigated the wound.  I then placed the drain through a separate stab incision in the anterior aspect of the incision and secured it to the skin  with 3-0  nylon.  It was looped both in the region of the hilum and back  posterior to where the lesion was removed.  I then closed the internal  oblique fascia with a running #1 PDS suture.  The external oblique fascia  was then closed a similar fashion and the subcu tissue was  copiously  irrigated with saline and closed with skin staples.  Sterile occlusive  dressing was applied and the patient was awakened and taken to the recovery  in stable satisfactory condition.  He tolerated the procedure well.  There  were no intraoperative complications.  He will be observed in the hospital  with serial hemoglobins and creatinines.      Mark C. Vernie Ammons, M.D.  Electronically Signed     MCO/MEDQ  D:  01/24/2006  T:  01/24/2006  Job:  [id number]

## 2010-11-20 NOTE — H&P (Signed)
NAME:  Robert Ramsey, Robert Ramsey               ACCOUNT NO.:  [id number]   MEDICAL RECORD NO.:  [id number]          PATIENT TYPE:  INP   LOCATION:  1824                         FACILITY:  [facility]   PHYSICIAN:  Jonna L. Robb Matar, M.D.DATE OF BIRTH:  06/30/1941   DATE OF ADMISSION:  11/25/2005  DATE OF DISCHARGE:                                HISTORY & PHYSICAL   CHIEF COMPLAINT:  Palpitations.   HISTORY OF PRESENT ILLNESS:  This 70 year old Caucasian male has had a two  day history of rapid heart rate.  He first noticed it two days ago when he  was lying on his left side.  His wife describes it as being fast but no  irregular.  That was on Monday.  On Tuesday, he saw Dr. Lenord Fellers, received  Toprol XL 25 twice.  Monday, he took Advil Cold and Sinus, two pills in the  middle of the day and one at bedtime, but has not taken any since then.  He  also, at the beginning of the month, was put on some prednisone and Relafen.  The patient denies any chest pain, cough, wheezing, dizziness, diaphoresis.  He does have some sinus congestion, but plain sinus films were unremarkable  done on Nov 23, 2005.   PAST MEDICAL HISTORY:  1.  Hypertension.  2.  Hyperlipidemia.  3.  Coronary artery disease.  Dr. Deborah Chalk has cardiac catheterization on him      July 30, 2005 which showed two vessel disease at 30% and one vessel      at 50%.  4.  History of pericarditis when he was 36.  5.  Cervical degenerative disk disease.  He saw Dr. Jeral Fruit.  He was put on      12 days of prednisone and Relafen as noted above.  6.  GERD and hiatal hernia.   OPERATION:  Catheterization and appendectomy.   FAMILY HISTORY:  Mother died of leukemia.  Father died of heart  failure__________ MI.  He has three brothers.  One died of motor vehicle  accident, one of MI, another is healthy.  He is married with two children,  two step-children.   SOCIAL HISTORY:  Nonsmoker, nondrinker.  He used to smoke a pipe but quit in  1985.  He is  retired from [employer].   ALLERGIES/ADVERSE REACTIONS:  When he takes NAPROSYN OR VIOXX, he gets  fever, blood pressure elevation and dizziness.  When he took [place], he  developed pseudomembranous colitis.   MEDICATIONS:  1.  Prilosec 20 daily.  2.  Vesicare 10 daily.  3.  Aspirin 81 daily.  4.  Crestor 5 mg daily.  5.  Lisinopril 5 mg daily.  6.  Zetia 10 mg daily.  7.  Relafen 750 mg.   REVIEW OF SYSTEMS:  All systems were reviewed and he is still having some  low grade sinus headache.  He has had some problems with xerosis.  He has  urinary frequency which has improved on the Vesicare.  He had some mild  arthritis in his hands.  The rest of review is unremarkable.   PHYSICAL EXAMINATION:  VITAL SIGNS:  He has run temperature of 100.6 and  101.2 over the last couple of days.  Pulse 117, 100, 109; respirations 20;  blood pressure 123/85.  GENERAL APPEARANCE:  Well-developed Caucasian male.  HEENT:  Conjunctivae and lids are normal.  Pupils are reactive.  He has  normal appearing mucosa.  NECK:  Shows no mass, thyromegaly or carotid bruits.  LUNGS:  Respiratory efforts normal.  Lungs are clear to A&P without  wheezing, rales, rhonchi or dullness.  HEART:  Regular rate and rhythm; tachycardic; normal S1, S2 without murmurs,  rubs or gallops.  There are no bruits.  No cyanosis, clubbing or edema.  ABDOMEN:  Nontender, normal bowel sounds, no hepatosplenomegaly.  There is  no cervical adenopathy.  EXTREMITIES:  Muscle strength is 5/5.  Full range of motion in all four  extremities.  SKIN:  No rashes, lesions or nodules.  NEUROLOGICAL:  Cranial nerves intact.  DTR's are symmetric.  Sensation is  normal.  Alert and oriented x3.  Normal memory, judgment and affect.   LABORATORY DATA:  D. dimer 0.3, BUN 20, creatinine 1.1, albumin 3.0.  White  count 7.8.  Rest of CBC is normal.  CK MB is normal at 3.3.  First troponin  was 0.22, second was 0.26, third one is pending.  Chest x-ray shows  a small  infiltrate or atelectasis at the cardiac apex.   IMPRESSION:  1.  Elevated troponin.  I suspect the patient is having at least unstable      angina.  I do not have any other explanation for the elevated troponin      as the patient is not in heart failure, does not have renal problems.      If the third set is continuing to elevate, I will recall Cardiology.  In      the meantime, I will put the patient on nitroglycerin, beta blocker,      aspirin, Statin and ACE.  2.  Sinus tachycardia.  Some of this is certainly coming from his fever.  3.  Upper respiratory infection, possibly early pneumonia.  I will put him      on azithromycin and add some Floraquin to try to hold off on developing      C. difficile.  4.  Hypertension.  5.  Hyperlipidemia.  6.  Gastroesophageal reflux disease.      Jonna L. Robb Matar, M.D.  Electronically Signed     JLB/MEDQ  D:  11/25/2005  T:  11/25/2005  Job:  [id number]

## 2010-11-20 NOTE — Cardiovascular Report (Signed)
NAMENICKOLIS, DIEL NO.:  [id number]   MEDICAL RECORD NO.:  [id number]          PATIENT TYPE:  OIB   LOCATION:  1962                         FACILITY:  [facility]   PHYSICIAN:  Colleen Can. Deborah Chalk, M.D.DATE OF BIRTH:  01-23-1941   DATE OF PROCEDURE:  07/29/2005  DATE OF DISCHARGE:                              CARDIAC CATHETERIZATION   HISTORY:  Dated is a 70 year old male with history of left bundle branch  block with atypical chest pains. Last catheterization was 15 years ago which  showed mild coronary atherosclerosis.   PROCEDURE:  Left heart catheterization with selective coronary angiography,  left ventricular angiography.   TYPE AND SITE OF ENTRY:  Percutaneous right femoral artery.   CATHETERS:  4-French 4-curved Judkins right and left coronary catheter, 4-  French pigtail ventriculographic catheter.   CONTRAST:  Pure Omnipaque.   MEDICATIONS GIVEN PRIOR TO PROCEDURE:  Valium 10 mg p.o.   MEDICATIONS GIVEN DURING THE PROCEDURE:  Versed 2 mg IV.   COMMENT:  The patient tolerated procedure well.   HEMODYNAMIC DATA:  The aortic pressure was 116/61, LV was 123/8-23. There is  no aortic valve gradient noted on pullback.   ANGIOGRAPHIC DATA:  Left ventricular angiogram showed a normal ejection  fraction of 50-55%. Regional wall motion appear to be normal. There was no  abnormal regional wall motion and any changes may be related to the left  bundle branch block. Overall, there was no mitral regurgitation,  intracardiac calcification or intracavitary filling defect.   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   1.  Left anterior descending:  The left anterior descending is a moderate      size vessel. The proximal 3 cm appear to be somewhat small but they      would be a 2.5 to 3 mm diameter range. There are four branches of the      left anterior descending as  they cross the anterior surface of the      heart. There are scattered irregularities and the vessels are relatively      small and in the 2.5 mm diameter range.  There are scattered      irregularities of no greater than 30% and they are really had diffuse      manner without focal obstructive narrowing.  2.  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 It is small but normal.  3.  Right coronary artery:  The right coronary artery is a large dominant      vessel. It has irregularities. There may be a 30-50% narrowing at the      proximal posterior descending but this does not appear to be obstructive      in nature.   OVERALL IMPRESSION:  1.  Well preserved global left ventricular function with a low normal      ejection fraction felt to be related to left bundle branch block.  2.  There is mild diffuse  nonobstructive three-vessel coronary      atherosclerosis.   PLAN:  We will arrange for continued medical management.      Colleen Can. Deborah Chalk, M.D.  Electronically Signed     SNT/MEDQ  D:  07/30/2005  T:  07/30/2005  Job:  [id number]   cc:   Luanna Cole. Lenord Fellers, M.D.  Fax: [phone]

## 2010-11-20 NOTE — Op Note (Signed)
NAME:  Robert Ramsey, Robert Ramsey                         ACCOUNT NO.:  [id number]   MEDICAL RECORD NO.:  [id number]                   PATIENT TYPE:  AMB   LOCATION:  ENDO                                 [facility]ACILITY:  [facility]   PHYSICIAN:  Petra Kuba, M.D.                 DATE OF BIRTH:  02/20/1941   DATE OF PROCEDURE:  02/21/2003  DATE OF DISCHARGE:                                 OPERATIVE REPORT   PROCEDURE:  Colonoscopy with hot biopsy.   INDICATIONS FOR PROCEDURE:  Screening.   Consent was signed after risks, benefits, methods, and options were  thoroughly discussed in the office in the past.   MEDICINES USED:  Demerol 100, Versed 8.   DESCRIPTION OF PROCEDURE:  Rectal inspection was pertinent for external  hemorrhoids, small. Digital exam was negative. The video colonoscope was  inserted and fairly easily advanced around the colon to the cecum. This did  require some abdominal pressure but no position changes. On insertion, some  left greater than rare right diverticula were seen. The cecum was identified  by the appendiceal orifice and the ileocecal valve. The scope was slowly  withdrawn. The prep was adequate. There was some liquid stool that required  washing and suctioning. Other than a right diverticula, no abnormalities  were seen. Diverticula increased in the left side. At the proximal level of  the splenic flexure, a tiny questionable polyp was seen and was hot biopsied  x1. On slow withdrawal through the left side of the colon other than  diverticula, no other abnormalities were seen. Anal rectal pull-through and  retroflexion confirmed the hemorrhoids.  The scope was reinserted a short  ways up the left side of the colon, air was suctioned, scope removed. The  patient tolerated the procedure well. There was no obvious or immediate  complications.   ENDOSCOPIC DIAGNOSIS:  1. Internal and external hemorrhoids.  2. Left sided diverticula with a rare right one.  3.  Questionable tiny splenic flexure polyp hot biopsied.  4. Otherwise within normal limits to the cecum.    PLAN:  Await pathology to determine future colonic screening. Probably  recheck in five years if doing well medically. Happy to see back p.r.n.,  otherwise, return care to Dr. Deborah Chalk and [place] for the customary health  care maintenance to include yearly rectals and guaiacs.                                               Petra Kuba, M.D.    MEM/MEDQ  D:  02/21/2003  T:  02/21/2003  Job:  [id number]   cc:   Colleen Can. Deborah Chalk, M.D.  Fax: [phone]   Luanna Cole. Lenord Fellers, M.D.  [address] [place]  [place]  [place] [address]  Fax:  [phone]  

## 2010-12-01 ENCOUNTER — Other Ambulatory Visit (INDEPENDENT_AMBULATORY_CARE_PROVIDER_SITE_OTHER): Payer: Medicare Other | Admitting: *Deleted

## 2010-12-01 DIAGNOSIS — E7800 Pure hypercholesterolemia, unspecified: Secondary | ICD-10-CM

## 2010-12-01 LAB — HEPATIC FUNCTION PANEL
AST: 20 U/L (ref 0–37)
Albumin: 3.7 g/dL (ref 3.5–5.2)
Total Protein: 6.3 g/dL (ref 6.0–8.3)

## 2010-12-01 LAB — BASIC METABOLIC PANEL
BUN: 19 mg/dL (ref 6–23)
CO2: 25 mEq/L (ref 19–32)
Chloride: 111 mEq/L (ref 96–112)
GFR: 65.21 mL/min (ref >60.00)
Potassium: 4.4 mEq/L (ref 3.5–5.1)

## 2010-12-01 LAB — LIPID PANEL
Cholesterol: 122 mg/dL (ref 0–200)
Triglycerides: 119 mg/dL (ref 0.0–149.0)
VLDL: 23.8 mg/dL (ref 0.0–40.0)

## 2010-12-07 ENCOUNTER — Telehealth: Payer: Self-pay | Admitting: *Deleted

## 2010-12-07 ENCOUNTER — Telehealth: Payer: Self-pay | Admitting: Cardiology

## 2010-12-07 DIAGNOSIS — T148XXA Other injury of unspecified body region, initial encounter: Secondary | ICD-10-CM

## 2010-12-07 MED ORDER — CYCLOBENZAPRINE HCL 10 MG PO TABS: 10.0000 mg | ORAL_TABLET | Freq: Three times a day (TID) | ORAL | Status: DC | PRN

## 2010-12-07 MED ORDER — HYDROCODONE-ACETAMINOPHEN 5-500 MG PO TABS: 1.0000 | ORAL_TABLET | ORAL | Status: DC | PRN

## 2010-12-07 NOTE — Telephone Encounter (Signed)
PT CHECKING ON HIS LAST LABWORK.

## 2010-12-07 NOTE — Telephone Encounter (Signed)
Message copied by Adolphus Birchwood on Mon Dec 07, 2010  6:01 PM ------      Message from: Roger Shelter      Created: Thu Dec 03, 2010  1:37 PM       Continue exercise, recheck in six months.

## 2010-12-07 NOTE — Telephone Encounter (Signed)
Dr Lenord Fellers spoke with patient regarding a pulled muscle over the weekend.  Medications ordered per MD.

## 2010-12-07 NOTE — Telephone Encounter (Addendum)
Pt notified of lab results.  Pt is c/o having a lot of neck pain.  Dr. Lenord Fellers placed pt on Hydrocodone and Flexeril until pt could be seen by Dr. Jeral Fruit.  Pt did not start Flexeril after reading the adverse side effects on the label.  Pt started on 12/05/10 to have an elevated HR in the 90's and very irregular.  BP ok.  Cough and low grade fever.  No other symptoms.  HR is still elevated today and irregular.  Dr. Deborah Chalk notified and instructed RN to have pt come in 12/08/10 for office visit at 10 am.  Pt notified and will come in tomorrow.

## 2010-12-08 ENCOUNTER — Encounter: Payer: Self-pay | Admitting: Cardiology

## 2010-12-08 ENCOUNTER — Ambulatory Visit (INDEPENDENT_AMBULATORY_CARE_PROVIDER_SITE_OTHER): Payer: Medicare Other | Admitting: Cardiology

## 2010-12-08 ENCOUNTER — Telehealth: Payer: Self-pay | Admitting: Cardiology

## 2010-12-08 VITALS — BP 144/80 | Wt 186.0 lb

## 2010-12-08 DIAGNOSIS — I4891 Unspecified atrial fibrillation: Secondary | ICD-10-CM

## 2010-12-08 LAB — PACEMAKER DEVICE OBSERVATION

## 2010-12-08 NOTE — Telephone Encounter (Signed)
Called because she had a question about her husband's medication she forgot to ask when her husband had came in earlier. Please call back.

## 2010-12-08 NOTE — Telephone Encounter (Signed)
Pt's wife Scarlette Calico would like to know if pt needs to start beta blocker.  Dr. Deborah Chalk notified and instructed RN pt is already on Metoprolol Tartrate which is a beta blocker and for pt to start the Valium prescription that was given today at office visit.  Scarlette Calico notified of Dr. Ronnald Nian instructions and verbalized to RN understanding of instructions.

## 2010-12-09 ENCOUNTER — Encounter: Payer: Self-pay | Admitting: Cardiology

## 2010-12-09 NOTE — Assessment & Plan Note (Signed)
Is not having atrial fibrillation on interrogation of his monitor. Because of his tendency toward arrhythmia as well as his underlying pacemaker, I'll start him on Valium 5 mg 3 times a day for him to take in addition to hydrocodone. I cautioned him and his wife about oversedation. They're using local heat.  He does have a history of cervical spine disease and wants to be referred to a different neurosurgeon. I will defer that to Dr. Lenord Fellers.  We will have followup with Dr. Johney Frame for general cardiology issues

## 2010-12-09 NOTE — Progress Notes (Signed)
Subjective:   Robert Ramsey is seen today for followup of palpitations over the last few days. Approximately 3-4 days ago, he developed severe neck discomfort and spasm was prescribed hydrocodone and Flexeril. He apparently had some palpitations at that time and the pharmacy information regarding Flexeril suggested it could worsen palpitations. He was reluctant to start on a medication with this morning. His wife has been checking his blood pressure readings and during the use blood pressure checks, she will note an occasional irregular pulse. Interrogation of his pacer today confirms that they're all PVCs. He's not had atrial fibrillation. Blood pressure readings have generally been satisfactory. His wife may also noted a low-grade temperature.  Current Outpatient Prescriptions  Medication Sig Dispense Refill  . BABY ASPIRIN PO Take by mouth daily.        Marland Kitchen docusate sodium (COLACE) 100 MG capsule Take 100 mg by mouth 2 (two) times daily.        Marland Kitchen ezetimibe (ZETIA) 10 MG tablet Take 10 mg by mouth daily.        Marland Kitchen HYDROcodone-acetaminophen (VICODIN) 5-500 MG per tablet Take 1 tablet by mouth every 4 (four) hours as needed for pain.  40 tablet  0  . metoprolol tartrate (LOPRESSOR) 25 MG tablet Take 50 mg by mouth 2 (two) times daily.       . Multiple Vitamin (MULTIVITAMIN) tablet Take 1 tablet by mouth daily.        Marland Kitchen omeprazole (PRILOSEC) 20 MG capsule Take 20 mg by mouth daily.        . rosuvastatin (CRESTOR) 5 MG tablet Take 5 mg by mouth. 1/2 tablet po on Monday, Wednesday, and Fridays      . solifenacin (VESICARE) 10 MG tablet Take 5 mg by mouth daily.       . cyclobenzaprine (FLEXERIL) 10 MG tablet Take 1 tablet (10 mg total) by mouth every 8 (eight) hours as needed for muscle spasms.  30 tablet  0    Allergies  Allergen Reactions  . Cefpodoxime Proxetil     REACTION: Colitis  . Nabumetone     REACTION: Heart issues  . Naproxen     REACTION: Reaction not known  . Nsaids     REACTION:  Reaction not known    Patient Active Problem List  Diagnoses  . Second degree Mobitz II AV block  . Atrial fibrillation  . Hypertension    History  Smoking status  . Former Smoker  . Quit date: 07/06/1983  Smokeless tobacco  . Not on file  Comment: stopped smoking in 1985    History  Alcohol Use No    Family History  Problem Relation Age of Onset  . Heart failure Father   . Leukemia Mother     Review of Systems:   The patient denies any heat or cold intolerance.  No weight gain or weight loss.  The patient denies headaches or blurry vision.  There is no cough or sputum production.  The patient denies dizziness.  There is no hematuria or hematochezia.  The patient denies any muscle aches or arthritis.  The patient denies any rash.  The patient denies frequent falling or instability.  There is no history of depression or anxiety.  All other systems were reviewed and are negative.   Physical Exam:   Weights 186 to blood pressure is 144/80 sitting, 130/80 standing, the neck does have limited mobility with cervical muscular spasm. Lungs are clear. Heart shows a regular rate and rhythm.  Abdomen is soft nontender extremities are without edema. Assessment / Plan:

## 2011-01-21 ENCOUNTER — Telehealth: Payer: Self-pay | Admitting: Internal Medicine

## 2011-01-21 MED ORDER — OMEPRAZOLE 20 MG PO CPDR: 20.0000 mg | DELAYED_RELEASE_CAPSULE | Freq: Every day | ORAL | Status: DC

## 2011-01-21 NOTE — Telephone Encounter (Signed)
Rx refill

## 2011-02-04 ENCOUNTER — Other Ambulatory Visit: Payer: Self-pay

## 2011-02-04 ENCOUNTER — Ambulatory Visit (INDEPENDENT_AMBULATORY_CARE_PROVIDER_SITE_OTHER): Payer: Medicare Other | Admitting: *Deleted

## 2011-02-04 DIAGNOSIS — I4891 Unspecified atrial fibrillation: Secondary | ICD-10-CM

## 2011-02-04 DIAGNOSIS — I441 Atrioventricular block, second degree: Secondary | ICD-10-CM

## 2011-02-04 LAB — REMOTE PACEMAKER DEVICE
AL AMPLITUDE: 2.8 mv
AL IMPEDENCE PM: 590 Ohm
AL THRESHOLD: 0.375 v
ATRIAL PACING PM: 20
BAMS-0001: 175 {beats}/min
BATTERY VOLTAGE: 2.79 v
RV LEAD AMPLITUDE: 16 mv
RV LEAD IMPEDENCE PM: 655 Ohm
RV LEAD THRESHOLD: 0.5 v
VENTRICULAR PACING PM: 21

## 2011-02-10 NOTE — Progress Notes (Signed)
Pacer remote check  

## 2011-02-16 ENCOUNTER — Encounter: Payer: Self-pay | Admitting: *Deleted

## 2011-02-17 ENCOUNTER — Encounter: Payer: Self-pay | Admitting: Nurse Practitioner

## 2011-02-17 ENCOUNTER — Ambulatory Visit (INDEPENDENT_AMBULATORY_CARE_PROVIDER_SITE_OTHER): Payer: Medicare Other | Admitting: Nurse Practitioner

## 2011-02-17 VITALS — BP 122/88 | HR 72 | Ht 70.0 in | Wt 188.6 lb

## 2011-02-17 DIAGNOSIS — I2510 Atherosclerotic heart disease of native coronary artery without angina pectoris: Secondary | ICD-10-CM

## 2011-02-17 DIAGNOSIS — E785 Hyperlipidemia, unspecified: Secondary | ICD-10-CM | POA: Insufficient documentation

## 2011-02-17 DIAGNOSIS — Z950 Presence of cardiac pacemaker: Secondary | ICD-10-CM

## 2011-02-17 DIAGNOSIS — I10 Essential (primary) hypertension: Secondary | ICD-10-CM

## 2011-02-17 NOTE — Assessment & Plan Note (Signed)
Has had recent phone check.

## 2011-02-17 NOTE — Patient Instructions (Addendum)
Stay on your current medicines You will need follow up labs in December I will have you see Dr. Johney Frame in 6 months for your general cardiology follow up.  Regular exercise is encouraged Call for any problems

## 2011-02-17 NOTE — Progress Notes (Signed)
Robert Ramsey Date of Birth: October 12, 1940   History of Present Illness: Robert Ramsey is seen today for his follow up visit. He is seen for Robert Ramsey. He is a former patient of Robert Ramsey. He was just here in June. He was having some palpitations then. He is now doing well. No chest pain or shortness of breath. No more palpitations. He is not exercising because his wife is having trouble with her knee and she can't walk. His memory seems to be ok with lower dose of statin therapy. He has had recent pacemaker check and is a little upset that he has not heard any news in that regards. He is tolerating his medicines. He is doing well overall.  Current Outpatient Prescriptions on File Prior to Visit  Medication Sig Dispense Refill  . BABY ASPIRIN PO Take 81 mg by mouth daily.       Marland Kitchen docusate sodium (COLACE) 100 MG capsule Take 100 mg by mouth 2 (two) times daily.        Marland Kitchen ezetimibe (ZETIA) 10 MG tablet Take 10 mg by mouth daily.        . metoprolol tartrate (LOPRESSOR) 25 MG tablet Take 50 mg by mouth 2 (two) times daily.       . Multiple Vitamin (MULTIVITAMIN) tablet Take 1 tablet by mouth daily.        Marland Kitchen omeprazole (PRILOSEC) 20 MG capsule Take 1 capsule (20 mg total) by mouth daily.  90 capsule  3  . rosuvastatin (CRESTOR) 5 MG tablet Take 5 mg by mouth. 1/2 tablet po on Monday, Wednesday, and Fridays      . solifenacin (VESICARE) 10 MG tablet Take 10 mg by mouth daily.         Allergies  Allergen Reactions  . Cefpodoxime Proxetil     REACTION: Colitis  . Flexeril (Cyclobenzaprine Hcl)   . Imodium (Loperamide Hcl)   . Levaquin   . Nabumetone     REACTION: Heart issues  . Naproxen     REACTION: Reaction not known  . Nsaids     REACTION: Reaction not known  . Vantin   . Vioxx (Rofecoxib)     Past Medical History  Diagnosis Date  . CAD (coronary artery disease)     cath in 2007 reveals mild diffuse CAD  . HTN (hypertension)   . Mobitz (type) II atrioventricular block May 2011      has PTVP in place  . Ventral hernia   . History of colonoscopy   . Memory impairment     better with lower dose statin  . PVC's (premature ventricular contractions)     Past Surgical History  Procedure Date  . Right nephrectomy 01/2006    partial for mass  . Cataract surgery 06/2009  . Appendectomy   . Insert / replace / remove pacemaker 11/18/09    implant  . Cardiac catheterization 07/29/2005    EF 50-55%; Has mild diffuse CAD    History  Smoking status  . Former Smoker  . Quit date: 07/06/1983  Smokeless tobacco  . Not on file  Comment: stopped smoking in 1985    History  Alcohol Use No    Family History  Problem Relation Age of Onset  . Heart failure Father   . Leukemia Mother     Review of Systems: The review of systems is as above.  All other systems were reviewed and are negative.  Physical Exam: BP 122/88  Pulse 72  Ht 5\' 10"  (1.778 m)  Wt 188 lb 9.6 oz (85.548 kg)  BMI 27.06 kg/m2 Patient is pleasant and in no acute distress. Skin is warm and dry. Color is normal.  HEENT is unremarkable. Normocephalic/atraumatic. PERRL. Sclera are nonicteric. Neck is supple. No masses. No JVD. Lungs are clear. Cardiac exam shows a regular rate and rhythm. Abdomen is soft. Extremities are without edema. Gait and ROM are intact. No gross neurologic deficits noted.  LABORATORY DATA:   Assessment / Plan:

## 2011-02-17 NOTE — Assessment & Plan Note (Signed)
He has mild diffuse CAD. Last stress test was in March of this year. There was a septal defect. Dr. Deborah Chalk felt this was more related to his LBBB and that overall the study was ok. He remains asymptomatic. Exercise is encouraged. He has appointment to see Dr. Johney Frame for general cardiology care in October.

## 2011-02-17 NOTE — Assessment & Plan Note (Signed)
Blood pressure is good at home. No change with his medicines. They do adjust the metoprolol if it is low.

## 2011-02-17 NOTE — Assessment & Plan Note (Signed)
He had satisfactory lipids in May. He will need repeat labs in December.

## 2011-04-09 ENCOUNTER — Ambulatory Visit (INDEPENDENT_AMBULATORY_CARE_PROVIDER_SITE_OTHER): Payer: Medicare Other | Admitting: Internal Medicine

## 2011-04-09 DIAGNOSIS — Z23 Encounter for immunization: Secondary | ICD-10-CM

## 2011-05-05 ENCOUNTER — Encounter: Payer: Self-pay | Admitting: Internal Medicine

## 2011-05-05 ENCOUNTER — Ambulatory Visit (INDEPENDENT_AMBULATORY_CARE_PROVIDER_SITE_OTHER): Payer: Medicare Other | Admitting: Internal Medicine

## 2011-05-05 DIAGNOSIS — Z950 Presence of cardiac pacemaker: Secondary | ICD-10-CM

## 2011-05-05 DIAGNOSIS — I2510 Atherosclerotic heart disease of native coronary artery without angina pectoris: Secondary | ICD-10-CM

## 2011-05-05 DIAGNOSIS — I10 Essential (primary) hypertension: Secondary | ICD-10-CM

## 2011-05-05 DIAGNOSIS — I441 Atrioventricular block, second degree: Secondary | ICD-10-CM

## 2011-05-05 DIAGNOSIS — E785 Hyperlipidemia, unspecified: Secondary | ICD-10-CM

## 2011-05-05 LAB — PACEMAKER DEVICE OBSERVATION
AL AMPLITUDE: 5.6 mv
BAMS-0001: 175 {beats}/min
BATTERY VOLTAGE: 2.79 V
RV LEAD AMPLITUDE: 15.68 mv
RV LEAD IMPEDENCE PM: 643 Ohm

## 2011-05-05 NOTE — Patient Instructions (Signed)
Your physician wants you to follow-up in: 6 months with Sunday Spillers and 12 months with Dr Jacquiline Doe will receive a reminder letter in the mail two months in advance. If you don't receive a letter, please call our office to schedule the follow-up appointment.   Remote monitoring is used to monitor your Pacemaker of ICD from home. This monitoring reduces the number of office visits required to check your device to one time per year. It allows [place] to keep an eye on the functioning of your device to ensure it is working properly. You are scheduled for a device check from home on 08/05/2011. You may send your transmission at any time that day. If you have a wireless device, the transmission will be sent automatically. After your physician reviews your transmission, you will receive a postcard with your next transmission date.

## 2011-05-06 ENCOUNTER — Encounter: Payer: Self-pay | Admitting: Internal Medicine

## 2011-05-06 NOTE — Assessment & Plan Note (Signed)
Stable No change required today  

## 2011-05-06 NOTE — Assessment & Plan Note (Signed)
Mild CAD Stable with medical therapy  Follow-up with Lawson Fiscal in 6 months I will see again in a year.

## 2011-05-06 NOTE — Assessment & Plan Note (Signed)
Follow-up labs as per Norma Fredrickson in December

## 2011-05-06 NOTE — Assessment & Plan Note (Signed)
Stable and doing well  Normal pacemaker function See Pace Art report No changes today

## 2011-05-06 NOTE — Progress Notes (Signed)
Robert Ramsey is a pleasant 70 y.o. yo patient with a h/o mobitz II second degree AV block sp PPM (MDT) by Dr Deborah Chalk  who presents today for EP follow-up.  The patient reports doing very well since his last visit to our office.  He remains very active despite his age.  His palpitations have improved.  Today, he  denies symptoms of chest pain, shortness of breath, orthopnea, PND, lower extremity edema, dizziness, presyncope, syncope, or neurologic sequela.  The patientis tolerating medications without difficulties and is otherwise without complaint today.   Past Medical History  Diagnosis Date  . CAD (coronary artery disease)     cath in 2007 reveals mild diffuse CAD  . HTN (hypertension)   . Mobitz (type) II atrioventricular block May 2011    has PTVP in place  . Ventral hernia   . History of colonoscopy   . Memory impairment     better with lower dose statin  . PVC's (premature ventricular contractions)     Past Surgical History  Procedure Date  . Right nephrectomy 01/2006    partial for mass  . Cataract surgery 06/2009  . Appendectomy   . Pacemaker insertion 11/18/09    MDT implanted by Dr Deborah Chalk  . Cardiac catheterization 07/29/2005    EF 50-55%; Has mild diffuse CAD    History   Social History  . Marital Status: Married    Spouse Name: N/A    Number of Children: N/A  . Years of Education: N/A   Occupational History  . Retired    Social History Main Topics  . Smoking status: Former Smoker    Quit date: 07/06/1983  . Smokeless tobacco: Not on file   Comment: stopped smoking in 1985  . Alcohol Use: No  . Drug Use: No  . Sexually Active: Not on file   Other Topics Concern  . Not on file   Social History Narrative   Retired from Environmental health practitioner.    Family History  Problem Relation Age of Onset  . Heart failure Father   . Leukemia Mother     Allergies  Allergen Reactions  . Cefpodoxime Proxetil     REACTION: Colitis  . Flexeril (Cyclobenzaprine  Hcl)   . Imodium (Loperamide Hcl)   . Levaquin   . Nabumetone     REACTION: Heart issues  . Naproxen     REACTION: Reaction not known  . Nsaids     REACTION: Reaction not known  . Vantin   . Vioxx (Rofecoxib)     Current Outpatient Prescriptions  Medication Sig Dispense Refill  . BABY ASPIRIN PO Take 81 mg by mouth daily.       Marland Kitchen docusate sodium (COLACE) 100 MG capsule Take 100 mg by mouth 2 (two) times daily.        Marland Kitchen ezetimibe (ZETIA) 10 MG tablet Take 10 mg by mouth daily.        . metoprolol (TOPROL-XL) 50 MG 24 hr tablet Take 50 mg by mouth 2 (two) times daily.        . Multiple Vitamin (MULTIVITAMIN) tablet Take 1 tablet by mouth daily.        Marland Kitchen omeprazole (PRILOSEC) 20 MG capsule Take 1 capsule (20 mg total) by mouth daily.  90 capsule  3  . rosuvastatin (CRESTOR) 5 MG tablet Take 5 mg by mouth. 1/2 tablet po on Monday, Wednesday, and Fridays      . solifenacin (VESICARE) 10 MG tablet Take 10  mg by mouth daily.         Physical Exam: Filed Vitals:   05/05/11 1147  BP: 150/88  Pulse: 80  Height: 5\' 9"  (1.753 m)  Weight: 189 lb (85.73 kg)    GEN- The patient is well appearing, alert and oriented x 3 today.   Head- normocephalic, atraumatic Eyes-  Sclera clear, conjunctiva pink Ears- hearing intact Oropharynx- clear Neck- supple, no JVP Lymph- no cervical lymphadenopathy Lungs- Clear to ausculation bilaterally, normal work of breathing Chest- pacemaker pocket is well healed Heart- Regular rate and rhythm, no murmurs, rubs or gallops, PMI not laterally displaced GI- soft, NT, ND, + BS Extremities- no clubbing, cyanosis, or edema MS- no significant deformity or atrophy Skin- no rash or lesion Psych- euthymic mood, full affect Neuro- strength and sensation are intact  ekg today reveals sinus rhythm 70 bpm, PR 278, V paced  Pacemaker interrogation- reviewed in detail today,  See PACEART report  Assessment and Plan:

## 2011-06-18 ENCOUNTER — Other Ambulatory Visit (INDEPENDENT_AMBULATORY_CARE_PROVIDER_SITE_OTHER): Payer: Medicare Other | Admitting: *Deleted

## 2011-06-18 ENCOUNTER — Encounter: Payer: Self-pay | Admitting: *Deleted

## 2011-06-18 DIAGNOSIS — E785 Hyperlipidemia, unspecified: Secondary | ICD-10-CM

## 2011-06-18 LAB — BASIC METABOLIC PANEL
BUN: 22 mg/dL (ref 6–23)
CO2: 24 mEq/L (ref 19–32)
Calcium: 9.3 mg/dL (ref 8.4–10.5)
Chloride: 112 mEq/L (ref 96–112)
Creatinine, Ser: 1.1 mg/dL (ref 0.4–1.5)
GFR: 69.19 mL/min (ref >60.00)
Glucose, Bld: 110 mg/dL — ABNORMAL HIGH (ref 70–99)
Potassium: 4.2 mEq/L (ref 3.5–5.1)
Sodium: 141 mEq/L (ref 135–145)

## 2011-06-18 LAB — LIPID PANEL
Cholesterol: 132 mg/dL (ref 0–200)
HDL: 44.1 mg/dL (ref >39.00)
LDL Cholesterol: 70 mg/dL (ref 0–99)
Total CHOL/HDL Ratio: 3
Triglycerides: 88 mg/dL (ref 0.0–149.0)
VLDL: 17.6 mg/dL (ref 0.0–40.0)

## 2011-06-18 LAB — HEPATIC FUNCTION PANEL
ALT: 17 U/L (ref 0–53)
AST: 20 U/L (ref 0–37)
Albumin: 3.8 g/dL (ref 3.5–5.2)
Alkaline Phosphatase: 53 U/L (ref 39–117)
Bilirubin, Direct: 0.1 mg/dL (ref 0.0–0.3)
Total Bilirubin: 0.5 mg/dL (ref 0.3–1.2)
Total Protein: 6.6 g/dL (ref 6.0–8.3)

## 2011-06-25 ENCOUNTER — Encounter: Payer: Self-pay | Admitting: Internal Medicine

## 2011-06-25 ENCOUNTER — Ambulatory Visit (INDEPENDENT_AMBULATORY_CARE_PROVIDER_SITE_OTHER): Payer: Medicare Other | Admitting: Internal Medicine

## 2011-06-25 VITALS — BP 142/90 | HR 84 | Temp 99.4°F | Ht 66.5 in | Wt 191.5 lb

## 2011-06-25 DIAGNOSIS — H6120 Impacted cerumen, unspecified ear: Secondary | ICD-10-CM

## 2011-06-25 DIAGNOSIS — H6690 Otitis media, unspecified, unspecified ear: Secondary | ICD-10-CM

## 2011-06-25 DIAGNOSIS — H6691 Otitis media, unspecified, right ear: Secondary | ICD-10-CM

## 2011-06-25 NOTE — Patient Instructions (Signed)
Take Zithromax Z-PAK 2 tablets day one followed by 1 tablet days 2 through 5. Call if not better in one week.

## 2011-06-25 NOTE — Progress Notes (Signed)
  Subjective:    Patient ID: Robert Ramsey, male    DOB: March 29, 1941, 70 y.o.   MRN: [id number]  HPI 70 year old white male in today complaining of right ear discomfort. No complaint-appearing loss. No sore throat. Says he keeps nasal congestion year-round due to allergies. Has not had any sore throat, no cough, no fever.    Review of Systems     Objective:   Physical Exam both TMs are obscurred by cerumen. Cerumen removed from right external ear canal. Right TM is dull but not red. Pharynx is slightly injected. Neck is supple. Chest clear. Left TM is okay.        Assessment & Plan:  Cerumen both ear canals  Right otitis media  Plan: Zithromax Z-Pak take 2 tabs by mouth day one followed by 1 tab by mouth days 2 through 5.

## 2011-07-19 IMAGING — CR DG CHEST 2V
2 series · 2 of 2 positions shown · non-contrast
Comparison: 03/01/2006 study.

CLINICAL DATA: History of hypertension and prior tobacco smoking.
Preop operative cardiopulmonary evaluation for pacemaker insertion.

CHEST - 2 VIEW

[view not recorded (1 of 2)]
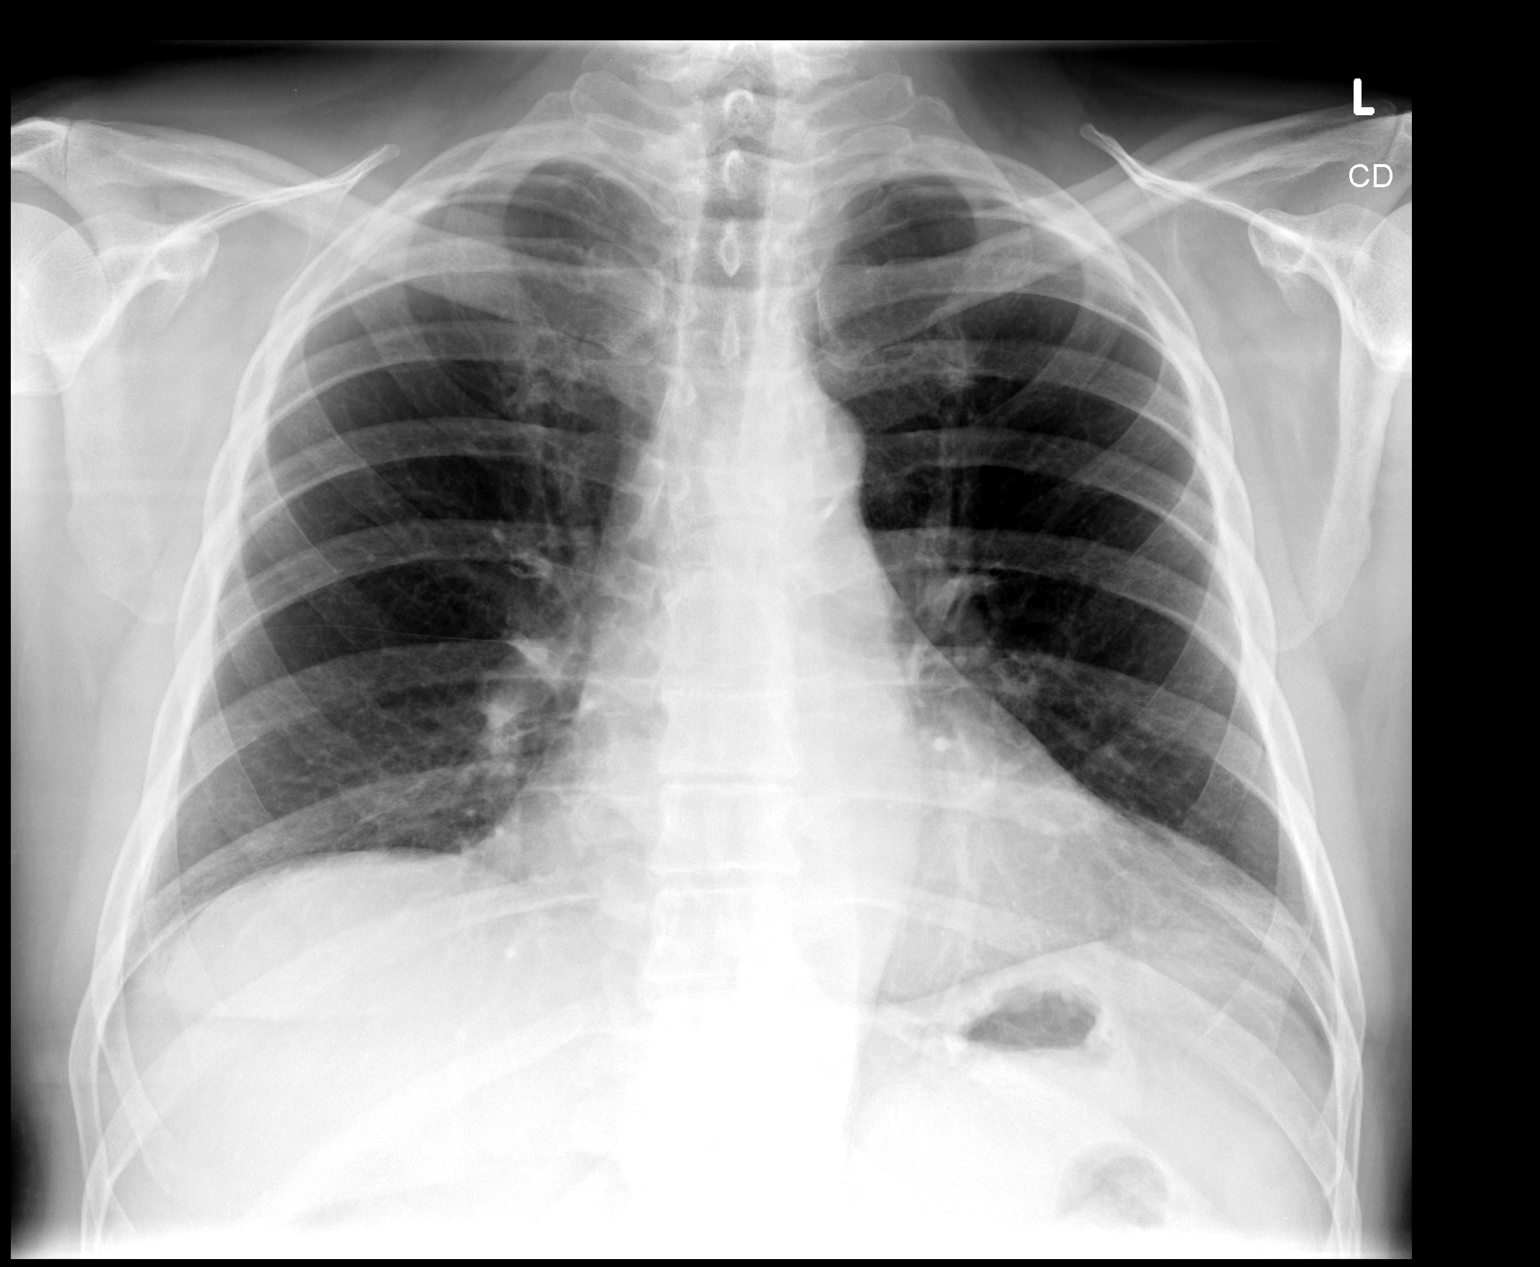

[view not recorded (2 of 2)]
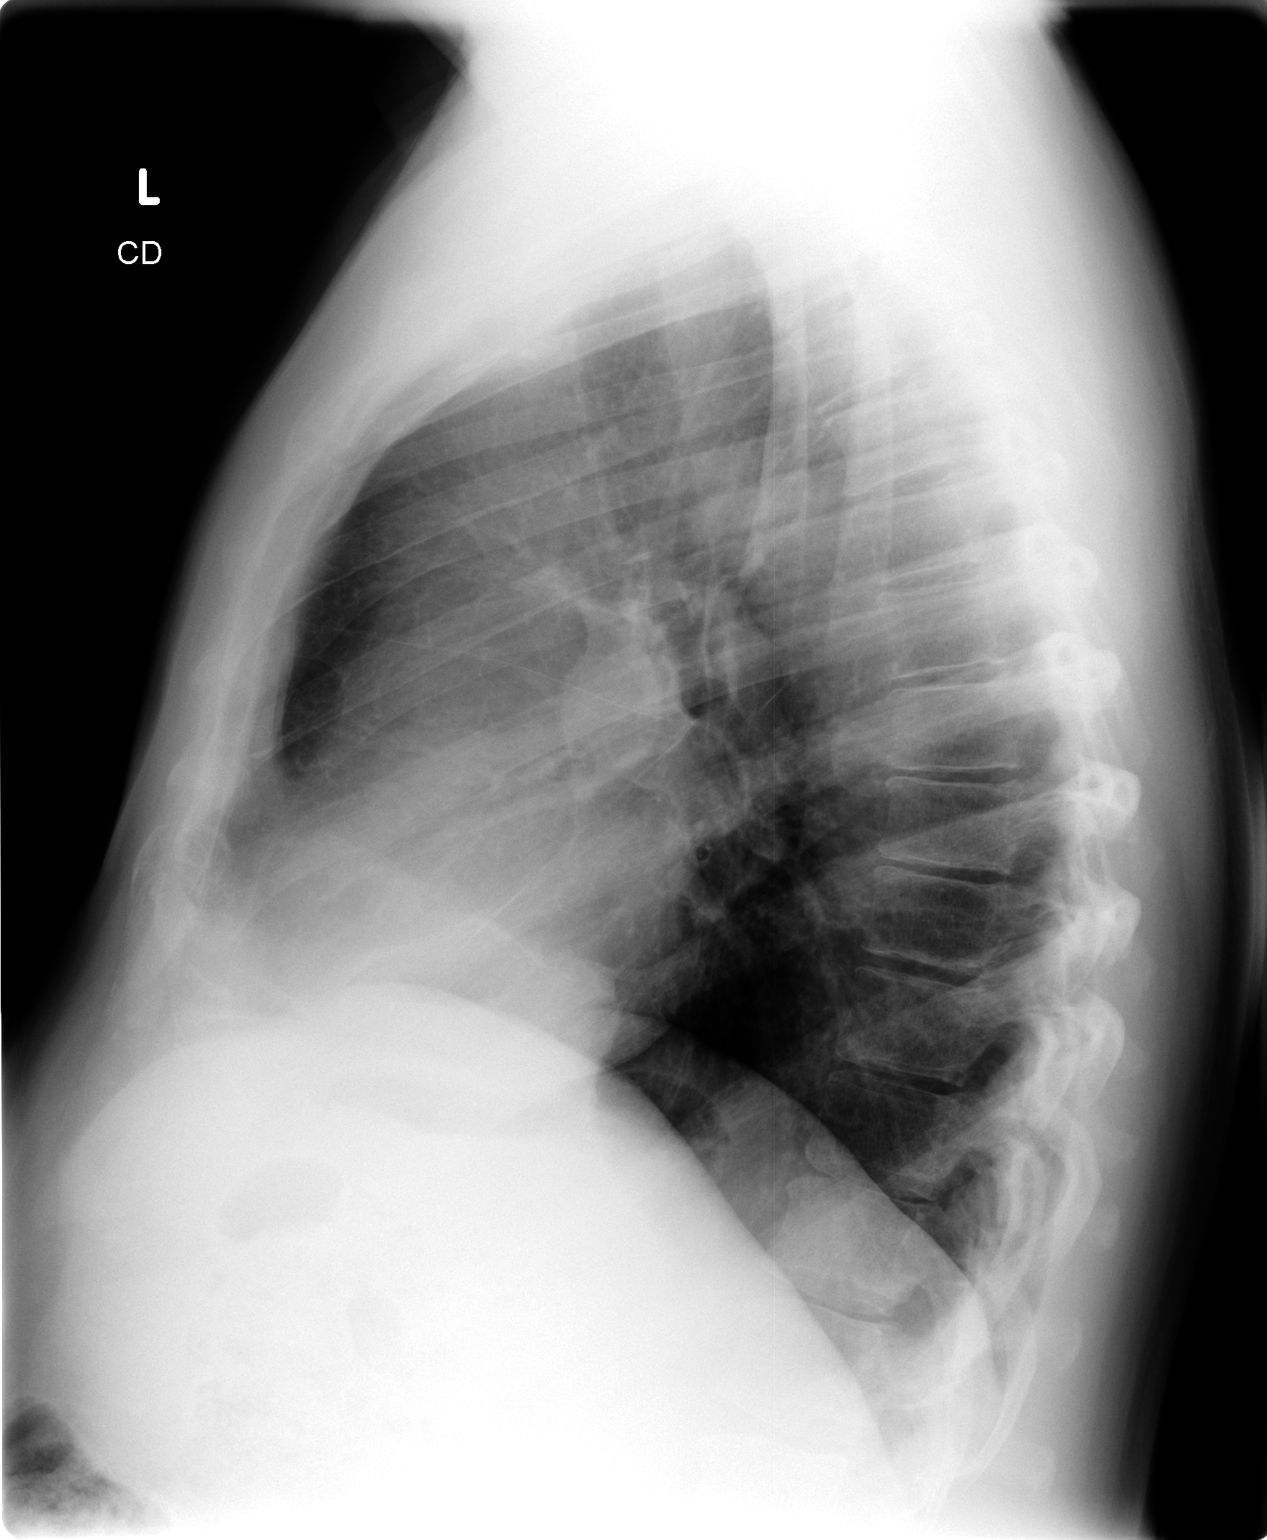

[2 of 2 positions shown; findings below may reference images not displayed]

FINDINGS: The cardiac silhouette is normal size and shape Ectasia
and nonaneurysmal calcification of the thoracic aorta is seen. The
lungs are well aerated and free of infiltrates. No pleural
abnormality is evident. Bones appear average for age.
IMPRESSION: No acute or active process is identified.

## 2011-08-05 ENCOUNTER — Encounter: Payer: Self-pay | Admitting: Internal Medicine

## 2011-08-05 ENCOUNTER — Ambulatory Visit (INDEPENDENT_AMBULATORY_CARE_PROVIDER_SITE_OTHER): Payer: Medicare Other | Admitting: *Deleted

## 2011-08-05 DIAGNOSIS — Z950 Presence of cardiac pacemaker: Secondary | ICD-10-CM

## 2011-08-05 DIAGNOSIS — I441 Atrioventricular block, second degree: Secondary | ICD-10-CM | POA: Diagnosis not present

## 2011-08-07 LAB — REMOTE PACEMAKER DEVICE
AL THRESHOLD: 0.375 V
ATRIAL PACING PM: 20
BAMS-0001: 175 {beats}/min
RV LEAD IMPEDENCE PM: 640 Ohm
RV LEAD THRESHOLD: 0.5 V
VENTRICULAR PACING PM: 37

## 2011-08-11 ENCOUNTER — Encounter: Payer: Self-pay | Admitting: *Deleted

## 2011-08-11 NOTE — Progress Notes (Signed)
Remote pacer check  

## 2011-09-13 DIAGNOSIS — H04129 Dry eye syndrome of unspecified lacrimal gland: Secondary | ICD-10-CM | POA: Diagnosis not present

## 2011-09-13 DIAGNOSIS — H259 Unspecified age-related cataract: Secondary | ICD-10-CM | POA: Diagnosis not present

## 2011-09-13 DIAGNOSIS — H2640 Unspecified secondary cataract: Secondary | ICD-10-CM | POA: Diagnosis not present

## 2011-09-13 DIAGNOSIS — Z961 Presence of intraocular lens: Secondary | ICD-10-CM | POA: Diagnosis not present

## 2011-09-15 DIAGNOSIS — L219 Seborrheic dermatitis, unspecified: Secondary | ICD-10-CM | POA: Diagnosis not present

## 2011-09-15 DIAGNOSIS — L738 Other specified follicular disorders: Secondary | ICD-10-CM | POA: Diagnosis not present

## 2011-09-20 ENCOUNTER — Encounter: Payer: Self-pay | Admitting: *Deleted

## 2011-09-20 DIAGNOSIS — I2510 Atherosclerotic heart disease of native coronary artery without angina pectoris: Secondary | ICD-10-CM

## 2011-09-20 DIAGNOSIS — Z950 Presence of cardiac pacemaker: Secondary | ICD-10-CM

## 2011-09-20 DIAGNOSIS — Z905 Acquired absence of kidney: Secondary | ICD-10-CM | POA: Insufficient documentation

## 2011-09-21 ENCOUNTER — Encounter: Payer: Self-pay | Admitting: Internal Medicine

## 2011-11-04 ENCOUNTER — Ambulatory Visit (INDEPENDENT_AMBULATORY_CARE_PROVIDER_SITE_OTHER): Payer: Medicare Other | Admitting: *Deleted

## 2011-11-04 ENCOUNTER — Encounter: Payer: Self-pay | Admitting: Internal Medicine

## 2011-11-04 DIAGNOSIS — I441 Atrioventricular block, second degree: Secondary | ICD-10-CM

## 2011-11-09 LAB — REMOTE PACEMAKER DEVICE
AL IMPEDENCE PM: 556 Ohm
AL THRESHOLD: 0.375 V
ATRIAL PACING PM: 21
BATTERY VOLTAGE: 2.79 V
RV LEAD AMPLITUDE: 16 mv
RV LEAD IMPEDENCE PM: 627 Ohm

## 2011-11-12 NOTE — Progress Notes (Signed)
PPM remote 

## 2011-11-19 ENCOUNTER — Encounter: Payer: Self-pay | Admitting: *Deleted

## 2011-12-07 ENCOUNTER — Encounter: Payer: Self-pay | Admitting: Cardiology

## 2011-12-13 ENCOUNTER — Ambulatory Visit (INDEPENDENT_AMBULATORY_CARE_PROVIDER_SITE_OTHER): Payer: Medicare Other | Admitting: Nurse Practitioner

## 2011-12-13 ENCOUNTER — Encounter: Payer: Self-pay | Admitting: Nurse Practitioner

## 2011-12-13 VITALS — BP 131/84 | HR 69 | Ht 69.0 in | Wt 186.6 lb

## 2011-12-13 DIAGNOSIS — I10 Essential (primary) hypertension: Secondary | ICD-10-CM | POA: Diagnosis not present

## 2011-12-13 DIAGNOSIS — E785 Hyperlipidemia, unspecified: Secondary | ICD-10-CM | POA: Diagnosis not present

## 2011-12-13 DIAGNOSIS — I2510 Atherosclerotic heart disease of native coronary artery without angina pectoris: Secondary | ICD-10-CM | POA: Diagnosis not present

## 2011-12-13 DIAGNOSIS — I441 Atrioventricular block, second degree: Secondary | ICD-10-CM

## 2011-12-13 MED ORDER — EZETIMIBE 10 MG PO TABS: 10.0000 mg | ORAL_TABLET | Freq: Every day | ORAL | Status: DC

## 2011-12-13 NOTE — Patient Instructions (Signed)
Let's get your fasting labs sometime this week.  Stay active  I have refilled your Zetia  Dr. Johney Frame will see you in 6 months  Call the [facility] office at [phone] if you have any questions, problems or concerns.

## 2011-12-13 NOTE — Assessment & Plan Note (Signed)
Blood pressure looks good. Readings from home are satisfactory. No change in his current regimen.

## 2011-12-13 NOTE — Assessment & Plan Note (Signed)
Doing well with no chest pain. Will plan on seeing him back in 6 months. Exercise is encouraged. Will check fasting labs later this week. Patient is agreeable to this plan and will call if any problems develop in the interim.

## 2011-12-13 NOTE — Assessment & Plan Note (Signed)
Pacemaker is in place. Has had recent remote check that was satisfactory.

## 2011-12-13 NOTE — Assessment & Plan Note (Signed)
Zetia is refilled today. He is able to tolerate very low dose statin therapy. Will check fasting labs later this week.

## 2011-12-13 NOTE — Progress Notes (Signed)
Robert Ramsey Date of Birth: 1940/07/11 Medical Record #[id number]  History of Present Illness: Robert Ramsey is seen back today for a 6 month check. He is seen for Dr. Johney Frame. He has mild CAD, HTN, HLD and pacemaker in place. He tolerates low dose statin therapy due to some issues with his memory in the past.  He comes in today. He is here with his wife. She has just had knee replacement. Robert Ramsey is doing well. No chest pain. Not short of breath. Not dizzy or lightheaded. Feels good on his current medicines. He has no complaint.   Current Outpatient Prescriptions on File Prior to Visit  Medication Sig Dispense Refill  . BABY ASPIRIN PO Take 81 mg by mouth daily.       Marland Kitchen docusate sodium (COLACE) 100 MG capsule Take 100 mg by mouth 2 (two) times daily.        . metoprolol (TOPROL-XL) 50 MG 24 hr tablet Take 50 mg by mouth 2 (two) times daily.        . Multiple Vitamin (MULTIVITAMIN) tablet Take 1 tablet by mouth daily.        Marland Kitchen omeprazole (PRILOSEC) 20 MG capsule Take 1 capsule (20 mg total) by mouth daily.  90 capsule  3  . rosuvastatin (CRESTOR) 5 MG tablet 1/2 tablet po on Monday, Wednesday, and Fridays      . solifenacin (VESICARE) 10 MG tablet Take 10 mg by mouth daily.       Marland Kitchen DISCONTD: ezetimibe (ZETIA) 10 MG tablet Take 10 mg by mouth daily.          Allergies  Allergen Reactions  . Cefpodoxime Proxetil     REACTION: Colitis  . Flexeril (Cyclobenzaprine Hcl)   . Imodium (Loperamide Hcl)   . Levofloxacin   . Nabumetone     REACTION: Heart issues  . Naproxen     REACTION: Reaction not known  . Nsaids     REACTION: Reaction not known  . Vantin   . Vioxx (Rofecoxib)     Past Medical History  Diagnosis Date  . HTN (hypertension)   . Mobitz (type) II atrioventricular block May 2011    has PTVP in place  . Ventral hernia   . History of colonoscopy   . Memory impairment     better with lower dose statin  . PVC's (premature ventricular contractions)   . Abnormal stress test  09/15/10    SEPTAL DEFECT SECONDARY TO PREVIOUS INFARCT VS LBBB; MILD SEPTAL ISCHEMIA  . CAD (coronary artery disease)     cath in 2007 reveals mild diffuse CAD  . Glucose intolerance (impaired glucose tolerance)     Past Surgical History  Procedure Date  . Right nephrectomy 01/2006    partial for mass  . Cataract surgery 06/2009  . Appendectomy   . Pacemaker insertion 11/18/09    MDT implanted by Dr Deborah Chalk  . Cardiac catheterization 07/29/2005    EF 50-55%; Has mild diffuse CAD  . Lbbb     History  Smoking status  . Former Smoker  . Quit date: 07/06/1983  Smokeless tobacco  . Not on file  Comment: stopped smoking in 1985    History  Alcohol Use No    Family History  Problem Relation Age of Onset  . Heart failure Father   . Leukemia Mother     Review of Systems: The review of systems is per the HPI. No real exercise regimen. Blood sugar was slightly elevated in December.  He is due for fasting labs.  Blood pressure diary from home looks good. All other systems were reviewed and are negative.  Physical Exam: BP 131/84  Pulse 69  Ht 5\' 9"  (1.753 m)  Wt 186 lb 9.6 oz (84.641 kg)  BMI 27.56 kg/m2 Patient is very pleasant and in no acute distress. Skin is warm and dry. Color is normal.  HEENT is unremarkable. Normocephalic/atraumatic. PERRL. Sclera are nonicteric. Neck is supple. No masses. No JVD. Lungs are clear. Cardiac exam shows a regular rate and rhythm. Abdomen is soft. Extremities are without edema. Gait and ROM are intact. No gross neurologic deficits noted.  LABORATORY DATA:  Lab Results  Component Value Date   GLUCOSE 110* 06/18/2011   CHOL 132 06/18/2011   TRIG 88.0 06/18/2011   HDL 44.10 06/18/2011   LDLCALC 70 06/18/2011   ALT 17 06/18/2011   AST 20 06/18/2011   NA 141 06/18/2011   K 4.2 06/18/2011   CL 112 06/18/2011   CREATININE 1.1 06/18/2011   BUN 22 06/18/2011   CO2 24 06/18/2011     Assessment / Plan:

## 2011-12-14 ENCOUNTER — Other Ambulatory Visit (INDEPENDENT_AMBULATORY_CARE_PROVIDER_SITE_OTHER): Payer: Medicare Other

## 2011-12-14 ENCOUNTER — Other Ambulatory Visit: Payer: Self-pay | Admitting: Nurse Practitioner

## 2011-12-14 DIAGNOSIS — E785 Hyperlipidemia, unspecified: Secondary | ICD-10-CM | POA: Diagnosis not present

## 2011-12-14 DIAGNOSIS — I2510 Atherosclerotic heart disease of native coronary artery without angina pectoris: Secondary | ICD-10-CM

## 2011-12-14 LAB — LIPID PANEL
Cholesterol: 112 mg/dL (ref 0–200)
HDL: 39.6 mg/dL (ref >39.00)
LDL Cholesterol: 52 mg/dL (ref 0–99)
Total CHOL/HDL Ratio: 3
Triglycerides: 102 mg/dL (ref 0.0–149.0)
VLDL: 20.4 mg/dL (ref 0.0–40.0)

## 2011-12-14 LAB — HEPATIC FUNCTION PANEL
ALT: 16 U/L (ref 0–53)
AST: 19 U/L (ref 0–37)
Albumin: 3.8 g/dL (ref 3.5–5.2)
Alkaline Phosphatase: 47 U/L (ref 39–117)
Bilirubin, Direct: 0 mg/dL (ref 0.0–0.3)
Total Bilirubin: 0.2 mg/dL — ABNORMAL LOW (ref 0.3–1.2)
Total Protein: 6.5 g/dL (ref 6.0–8.3)

## 2011-12-14 LAB — BASIC METABOLIC PANEL
BUN: 22 mg/dL (ref 6–23)
CO2: 27 mEq/L (ref 19–32)
Calcium: 9.5 mg/dL (ref 8.4–10.5)
Chloride: 111 mEq/L (ref 96–112)
Creatinine, Ser: 1 mg/dL (ref 0.4–1.5)
GFR: 75.32 mL/min (ref >60.00)
Glucose, Bld: 92 mg/dL (ref 70–99)
Potassium: 4.3 mEq/L (ref 3.5–5.1)
Sodium: 143 mEq/L (ref 135–145)

## 2012-01-17 DIAGNOSIS — N401 Enlarged prostate with lower urinary tract symptoms: Secondary | ICD-10-CM | POA: Diagnosis not present

## 2012-01-17 DIAGNOSIS — N402 Nodular prostate without lower urinary tract symptoms: Secondary | ICD-10-CM | POA: Diagnosis not present

## 2012-01-17 DIAGNOSIS — N138 Other obstructive and reflux uropathy: Secondary | ICD-10-CM | POA: Diagnosis not present

## 2012-01-24 DIAGNOSIS — N401 Enlarged prostate with lower urinary tract symptoms: Secondary | ICD-10-CM | POA: Diagnosis not present

## 2012-01-24 DIAGNOSIS — N402 Nodular prostate without lower urinary tract symptoms: Secondary | ICD-10-CM | POA: Diagnosis not present

## 2012-01-24 DIAGNOSIS — R350 Frequency of micturition: Secondary | ICD-10-CM | POA: Diagnosis not present

## 2012-02-10 ENCOUNTER — Other Ambulatory Visit: Payer: Self-pay | Admitting: Internal Medicine

## 2012-02-10 ENCOUNTER — Encounter: Payer: Self-pay | Admitting: Internal Medicine

## 2012-02-10 ENCOUNTER — Ambulatory Visit (INDEPENDENT_AMBULATORY_CARE_PROVIDER_SITE_OTHER): Payer: Medicare Other | Admitting: *Deleted

## 2012-02-10 ENCOUNTER — Telehealth: Payer: Self-pay | Admitting: Internal Medicine

## 2012-02-10 DIAGNOSIS — I441 Atrioventricular block, second degree: Secondary | ICD-10-CM

## 2012-02-10 DIAGNOSIS — Z950 Presence of cardiac pacemaker: Secondary | ICD-10-CM | POA: Diagnosis not present

## 2012-02-10 NOTE — Telephone Encounter (Signed)
Patient informed. Will come in for a tdap next week

## 2012-02-10 NOTE — Telephone Encounter (Signed)
Please check his chart for Tdap. He is not seen here very often as he alternates visits with cardiologist with visits here.

## 2012-02-11 ENCOUNTER — Encounter: Payer: Self-pay | Admitting: *Deleted

## 2012-02-11 LAB — REMOTE PACEMAKER DEVICE
AL AMPLITUDE: 2.8 mv
AL THRESHOLD: 0.5 V
BAMS-0001: 175 {beats}/min
BATTERY VOLTAGE: 2.79 V
RV LEAD AMPLITUDE: 11.2 mv

## 2012-02-18 ENCOUNTER — Ambulatory Visit (INDEPENDENT_AMBULATORY_CARE_PROVIDER_SITE_OTHER): Payer: Medicare Other | Admitting: Internal Medicine

## 2012-02-18 DIAGNOSIS — Z23 Encounter for immunization: Secondary | ICD-10-CM

## 2012-02-18 MED ORDER — TETANUS-DIPHTH-ACELL PERTUSSIS 5-2.5-18.5 LF-MCG/0.5 IM SUSP: 0.5000 mL | Freq: Once | INTRAMUSCULAR | Status: DC

## 2012-02-23 ENCOUNTER — Encounter: Payer: Self-pay | Admitting: *Deleted

## 2012-03-14 ENCOUNTER — Other Ambulatory Visit: Payer: Self-pay | Admitting: Internal Medicine

## 2012-03-14 MED ORDER — METOPROLOL SUCCINATE ER 50 MG PO TB24: 50.0000 mg | ORAL_TABLET | Freq: Two times a day (BID) | ORAL | Status: DC

## 2012-03-15 ENCOUNTER — Telehealth: Payer: Self-pay | Admitting: *Deleted

## 2012-03-15 ENCOUNTER — Other Ambulatory Visit: Payer: Self-pay

## 2012-03-15 MED ORDER — METOPROLOL SUCCINATE ER 50 MG PO TB24: 50.0000 mg | ORAL_TABLET | Freq: Two times a day (BID) | ORAL | Status: DC

## 2012-03-15 MED ORDER — METOPROLOL TARTRATE 50 MG PO TABS: 50.0000 mg | ORAL_TABLET | Freq: Two times a day (BID) | ORAL | Status: DC

## 2012-03-15 NOTE — Telephone Encounter (Signed)
Pt called and was worried about his metoprolol, his last bottle said tartrate, and new Rx called for succinate.  Caralee Ates, CMA

## 2012-03-15 NOTE — Telephone Encounter (Signed)
Spoke with Norma Fredrickson, NP she also could not find how the pt's Rx was changed from MetoprololTartrate to Metoprolol Succinate, she asked that I call the pharmacy and confirm last refill to confirm what was the correct Rx.  Called and spoke with Trinna Post the pharmacist at [facility] [phone], he confirmed pt takes metoprolol tartrate 50 MG tablets 1-bid.  Gave Alex verbal refill of the correct medication metoprolol tartrate 50 MG tablets 1 - bid with 90 supply with 3 refills. Caralee Ates, CMA

## 2012-03-15 NOTE — Telephone Encounter (Signed)
Called the pt back,  I explained that I had spoke with both Norma Fredrickson, NP and his pharmacist to confirm what Rx he was supposed to be taking, I confirmed that he is Rx was supposed to metoprolol tartrate 50 MG bid.  I also informed him that I correct this medication with his pharmacist.  Pt also aware I asked pharmacy to disregard last request and corrected it to read metoprolol tartrate 50 MG 1 tablet bid with 90 day supply with 3 refills, he understands it will be correct when he picks up his next Rx.  Caralee Ates, CMA

## 2012-03-16 MED ORDER — METOPROLOL TARTRATE 50 MG PO TABS: 50.0000 mg | ORAL_TABLET | Freq: Two times a day (BID) | ORAL | Status: DC

## 2012-03-31 ENCOUNTER — Other Ambulatory Visit: Payer: Self-pay | Admitting: Cardiology

## 2012-03-31 DIAGNOSIS — I498 Other specified cardiac arrhythmias: Secondary | ICD-10-CM

## 2012-03-31 DIAGNOSIS — I4891 Unspecified atrial fibrillation: Secondary | ICD-10-CM

## 2012-03-31 DIAGNOSIS — I441 Atrioventricular block, second degree: Secondary | ICD-10-CM

## 2012-03-31 DIAGNOSIS — I10 Essential (primary) hypertension: Secondary | ICD-10-CM

## 2012-03-31 MED ORDER — ROSUVASTATIN CALCIUM 5 MG PO TABS: ORAL_TABLET | ORAL | Status: DC

## 2012-04-04 ENCOUNTER — Ambulatory Visit (INDEPENDENT_AMBULATORY_CARE_PROVIDER_SITE_OTHER): Payer: Medicare Other | Admitting: Internal Medicine

## 2012-04-04 DIAGNOSIS — Z23 Encounter for immunization: Secondary | ICD-10-CM | POA: Diagnosis not present

## 2012-06-13 ENCOUNTER — Other Ambulatory Visit (INDEPENDENT_AMBULATORY_CARE_PROVIDER_SITE_OTHER): Payer: Medicare Other

## 2012-06-13 DIAGNOSIS — I2510 Atherosclerotic heart disease of native coronary artery without angina pectoris: Secondary | ICD-10-CM | POA: Diagnosis not present

## 2012-06-13 DIAGNOSIS — E785 Hyperlipidemia, unspecified: Secondary | ICD-10-CM

## 2012-06-13 LAB — BASIC METABOLIC PANEL
BUN: 27 mg/dL — ABNORMAL HIGH (ref 6–23)
CO2: 26 mEq/L (ref 19–32)
Calcium: 9.4 mg/dL (ref 8.4–10.5)
Chloride: 107 mEq/L (ref 96–112)
Creatinine, Ser: 1.1 mg/dL (ref 0.4–1.5)
GFR: 72.77 mL/min (ref >60.00)
Glucose, Bld: 106 mg/dL — ABNORMAL HIGH (ref 70–99)
Potassium: 4.3 mEq/L (ref 3.5–5.1)
Sodium: 137 mEq/L (ref 135–145)

## 2012-06-13 LAB — HEPATIC FUNCTION PANEL
ALT: 23 U/L (ref 0–53)
AST: 25 U/L (ref 0–37)
Albumin: 3.7 g/dL (ref 3.5–5.2)
Alkaline Phosphatase: 48 U/L (ref 39–117)
Bilirubin, Direct: 0 mg/dL (ref 0.0–0.3)
Total Bilirubin: 0.4 mg/dL (ref 0.3–1.2)
Total Protein: 6.5 g/dL (ref 6.0–8.3)

## 2012-06-13 LAB — LIPID PANEL
Cholesterol: 133 mg/dL (ref 0–200)
HDL: 34.7 mg/dL — ABNORMAL LOW (ref >39.00)
LDL Cholesterol: 64 mg/dL (ref 0–99)
Total CHOL/HDL Ratio: 4
Triglycerides: 173 mg/dL — ABNORMAL HIGH (ref 0.0–149.0)
VLDL: 34.6 mg/dL (ref 0.0–40.0)

## 2012-06-15 ENCOUNTER — Ambulatory Visit (INDEPENDENT_AMBULATORY_CARE_PROVIDER_SITE_OTHER): Payer: Medicare Other | Admitting: Internal Medicine

## 2012-06-15 ENCOUNTER — Encounter: Payer: Self-pay | Admitting: Internal Medicine

## 2012-06-15 ENCOUNTER — Other Ambulatory Visit: Payer: Medicare Other

## 2012-06-15 VITALS — BP 142/82 | HR 67 | Ht 70.0 in | Wt 189.8 lb

## 2012-06-15 DIAGNOSIS — I441 Atrioventricular block, second degree: Secondary | ICD-10-CM

## 2012-06-15 DIAGNOSIS — I10 Essential (primary) hypertension: Secondary | ICD-10-CM

## 2012-06-15 DIAGNOSIS — I2510 Atherosclerotic heart disease of native coronary artery without angina pectoris: Secondary | ICD-10-CM

## 2012-06-15 DIAGNOSIS — Z950 Presence of cardiac pacemaker: Secondary | ICD-10-CM | POA: Diagnosis not present

## 2012-06-15 DIAGNOSIS — E785 Hyperlipidemia, unspecified: Secondary | ICD-10-CM | POA: Diagnosis not present

## 2012-06-15 NOTE — Progress Notes (Signed)
ELECTROPHYSIOLOGY OFFICE NOTE  Patient ID: Robert Ramsey MRN: [id number], DOB/AGE: [birth year]   Date of Visit: 06/15/2012  Primary Physician: Margaree Mackintosh, MD Primary Cardiologist: Johney Frame, MD Reason for Visit: EP/device follow-up  History of Present Illness  Mr. [name] is a pleasant 71 year old man with Mobitz II AV block s/p PPM who presents today for routine electrophysiology followup. Since last being seen in our clinic, he reports doing well. Today, he denies symptoms of palpitations, chest pain, shortness of breath, orthopnea, PND, lower extremity edema, dizziness, presyncope or syncope. He feels he is tolerating medications without difficulties and is without complaint today.   Past Medical History  Diagnosis Date  . HTN (hypertension)   . Mobitz (type) II atrioventricular block May 2011    has PTVP in place  . Ventral hernia   . History of colonoscopy   . Memory impairment     better with lower dose statin  . PVC's (premature ventricular contractions)   . Abnormal stress test 09/15/10    SEPTAL DEFECT SECONDARY TO PREVIOUS INFARCT VS LBBB; MILD SEPTAL ISCHEMIA  . CAD (coronary artery disease)     cath in 2007 reveals mild diffuse CAD  . Glucose intolerance (impaired glucose tolerance)     Past Surgical History  Procedure Date  . Right nephrectomy 01/2006    partial for mass  . Cataract surgery 06/2009  . Appendectomy   . Pacemaker insertion 11/18/09    MDT implanted by Dr Deborah Chalk  . Cardiac catheterization 07/29/2005    EF 50-55%; Has mild diffuse CAD  . Lbbb      Allergies/Intolerances Allergies  Allergen Reactions  . Cefpodoxime Proxetil     REACTION: Colitis  . Flexeril (Cyclobenzaprine Hcl)   . Imodium (Loperamide Hcl)   . Levofloxacin   . Nabumetone     REACTION: Heart issues  . Naproxen     REACTION: Reaction not known  . Nsaids     REACTION: Reaction not known  . Vantin   . Vioxx (Rofecoxib)     Current Home Medications Current Outpatient  Prescriptions  Medication Sig Dispense Refill  . BABY ASPIRIN PO Take 81 mg by mouth daily.       Marland Kitchen ezetimibe (ZETIA) 10 MG tablet Take 1 tablet (10 mg total) by mouth daily.  90 tablet  3  . fesoterodine (TOVIAZ) 8 MG TB24 Take 8 mg by mouth daily.      . metoprolol (LOPRESSOR) 50 MG tablet Take 1 tablet (50 mg total) by mouth 2 (two) times daily.  180 tablet  3  . Multiple Vitamin (MULTIVITAMIN) tablet Take 1 tablet by mouth daily.        Marland Kitchen omeprazole (PRILOSEC) 20 MG capsule TAKE 1 CAPSULE (20 MG TOTAL) BY MOUTH DAILY.  90 capsule  3  . rosuvastatin (CRESTOR) 5 MG tablet 1/2 tablet po on Monday, Wednesday, and Fridays  30 tablet  5    Social History Social History  . Marital Status: Married   Occupational History  . Retired    Social History Main Topics  . Smoking status: Former Smoker    Quit date: 07/06/1983  . Smokeless tobacco: Not on file     Comment: stopped smoking in 1985  . Alcohol Use: No  . Drug Use: No   Social History Narrative   Retired from Environmental health practitioner.    Review of Systems General: No chills, fever, night sweats or weight changes Cardiovascular: No chest pain, dyspnea on exertion, edema, orthopnea,  palpitations, paroxysmal nocturnal dyspnea Dermatological: No rash, lesions or masses Respiratory: No cough, dyspnea Urologic: No hematuria, dysuria Abdominal: No nausea, vomiting, diarrhea, bright red blood per rectum, melena, or hematemesis Neurologic: No visual changes, weakness, changes in mental status All other systems reviewed and are otherwise negative except as noted above.  Physical Exam Blood pressure 142/82, pulse 67, height 5\' 10"  (1.778 m), weight 189 lb 12.8 oz (86.093 kg).  General: Well developed, well appearing 71 year old male in no acute distress. HEENT: Normocephalic, atraumatic. EOMs intact. Sclera nonicteric. Oropharynx clear.  Neck: Supple. No JVD. Lungs:  Respirations regular and unlabored, CTA bilaterally. No wheezes, rales  or rhonchi. Heart: RRR. S1, S2 present. No murmurs, rub, S3 or S4. Abdomen: Soft, non-distended. Extremities: No clubbing, cyanosis or edema. DP/PT/Radials 2+ and equal bilaterally. Psych: Normal affect. Neuro: Alert and oriented X 3. Moves all extremities spontaneously.   Diagnostics Device interrogation shows normal PPM function with good battery status and stable lead measurements/parameters; no mode switches; 1 VHR episode, EGMs consistent with atrial tachycardia with 1:1 conduction; histograms appropriate; no programming changes made; see PaceArt report  Assessment and Plan 1. Mobitz II AV block s/p PPM  His PPM is functioning normally. There were no programming changes made today.   2. HTN Stable No change required today  3. HL Stable, lipids reviewed with the patient today No change required today  4. CAD No ischemic symptoms He will follow-up with Norma Fredrickson, NP in 6 months and with me in one year.

## 2012-06-15 NOTE — Patient Instructions (Signed)
Your physician wants you to follow-up in: 6 months with Sunday Spillers and 12 months with Dr Jacquiline Doe will receive a reminder letter in the mail two months in advance. If you don't receive a letter, please call our office to schedule the follow-up appointment.  Remote monitoring is used to monitor your Pacemaker of ICD from home. This monitoring reduces the number of office visits required to check your device to one time per year. It allows [place] to keep an eye on the functioning of your device to ensure it is working properly. You are scheduled for a device check from home on 09/18/12. You may send your transmission at any time that day. If you have a wireless device, the transmission will be sent automatically. After your physician reviews your transmission, you will receive a postcard with your next transmission date.

## 2012-06-16 LAB — PACEMAKER DEVICE OBSERVATION
AL AMPLITUDE: 5.6 mv
BAMS-0001: 175 {beats}/min
RV LEAD IMPEDENCE PM: 658 Ohm
RV LEAD THRESHOLD: 0.75 V

## 2012-08-29 ENCOUNTER — Telehealth: Payer: Self-pay | Admitting: Internal Medicine

## 2012-08-29 NOTE — Telephone Encounter (Addendum)
New Problem:  Patient called in because he walked through a metal detector and was wondering if his device might have been damaged.  Please call back.  Pt called again because he had not heard from anyone.  Contacted Dr. Johney Frame who stated there was no significant source of concern.  Contacted patient and advised him of this. Patient stated he would feel better if he transmitted tomorrow to make sure his device is functioning normally.  Agreed this would be a good idea.  Theodore Demark, PA 08/29/2012 6:18 PM

## 2012-08-30 NOTE — Telephone Encounter (Signed)
Spoke w/pt this am and pt to send transmission today to be reviewed. Pt to be called once reviewed.

## 2012-09-07 ENCOUNTER — Encounter: Payer: Self-pay | Admitting: Cardiology

## 2012-09-18 ENCOUNTER — Ambulatory Visit (INDEPENDENT_AMBULATORY_CARE_PROVIDER_SITE_OTHER): Payer: Medicare Other | Admitting: *Deleted

## 2012-09-18 ENCOUNTER — Other Ambulatory Visit: Payer: Self-pay | Admitting: Internal Medicine

## 2012-09-18 DIAGNOSIS — Z950 Presence of cardiac pacemaker: Secondary | ICD-10-CM | POA: Diagnosis not present

## 2012-09-18 DIAGNOSIS — I441 Atrioventricular block, second degree: Secondary | ICD-10-CM

## 2012-09-21 DIAGNOSIS — H259 Unspecified age-related cataract: Secondary | ICD-10-CM | POA: Diagnosis not present

## 2012-09-21 DIAGNOSIS — H5700 Unspecified anomaly of pupillary function: Secondary | ICD-10-CM | POA: Diagnosis not present

## 2012-09-21 DIAGNOSIS — Z961 Presence of intraocular lens: Secondary | ICD-10-CM | POA: Diagnosis not present

## 2012-09-21 DIAGNOSIS — H2640 Unspecified secondary cataract: Secondary | ICD-10-CM | POA: Diagnosis not present

## 2012-09-24 LAB — REMOTE PACEMAKER DEVICE
AL AMPLITUDE: 2.8 mv
ATRIAL PACING PM: 21
BAMS-0001: 175 {beats}/min
VENTRICULAR PACING PM: 100

## 2012-10-17 ENCOUNTER — Encounter: Payer: Self-pay | Admitting: *Deleted

## 2012-10-23 ENCOUNTER — Encounter: Payer: Self-pay | Admitting: Internal Medicine

## 2012-11-07 ENCOUNTER — Encounter: Payer: Self-pay | Admitting: Cardiology

## 2012-12-01 ENCOUNTER — Ambulatory Visit (INDEPENDENT_AMBULATORY_CARE_PROVIDER_SITE_OTHER): Payer: Medicare Other | Admitting: Internal Medicine

## 2012-12-01 ENCOUNTER — Encounter: Payer: Self-pay | Admitting: Internal Medicine

## 2012-12-01 VITALS — BP 134/76 | HR 80 | Temp 98.3°F | Wt 185.0 lb

## 2012-12-01 DIAGNOSIS — J029 Acute pharyngitis, unspecified: Secondary | ICD-10-CM

## 2012-12-01 LAB — POCT RAPID STREP A (OFFICE): Rapid Strep A Screen: NEGATIVE

## 2012-12-01 NOTE — Progress Notes (Signed)
  Subjective:    Patient ID: Robert Ramsey, male    DOB: 06/13/1941, 72 y.o.   MRN: [id number]  HPI Two-day history of URI symptoms. Complaining of ear congestion and sore throat. No fever or shaking chills. No significant cough. Has malaise and fatigue.    Review of Systems     Objective:   Physical Exam pharynx is red without exudate. Rapid strep screen is negative. TMs are full bilaterally but not red. Neck is supple without significant adenopathy. Chest clear to auscultation        Assessment & Plan:  Acute URI  Bilateral serous otitis media  Pharyngitis-nonstrep  Plan: Zithromax Z-Pak take 2 tablets day one followed by 1 tablet days 2 through 5

## 2012-12-01 NOTE — Patient Instructions (Addendum)
Take Zithromax Z-PAK as directed. 

## 2012-12-25 ENCOUNTER — Encounter: Payer: Self-pay | Admitting: Internal Medicine

## 2012-12-25 ENCOUNTER — Ambulatory Visit (INDEPENDENT_AMBULATORY_CARE_PROVIDER_SITE_OTHER): Payer: Medicare Other | Admitting: *Deleted

## 2012-12-25 DIAGNOSIS — I441 Atrioventricular block, second degree: Secondary | ICD-10-CM | POA: Diagnosis not present

## 2012-12-25 DIAGNOSIS — Z950 Presence of cardiac pacemaker: Secondary | ICD-10-CM | POA: Diagnosis not present

## 2012-12-26 ENCOUNTER — Telehealth: Payer: Self-pay | Admitting: Internal Medicine

## 2012-12-26 DIAGNOSIS — I10 Essential (primary) hypertension: Secondary | ICD-10-CM

## 2012-12-26 DIAGNOSIS — E7800 Pure hypercholesterolemia, unspecified: Secondary | ICD-10-CM

## 2012-12-26 NOTE — Telephone Encounter (Signed)
Lawson Fiscal, Do you want him to have Lipid/Liver/BMP done prior?

## 2012-12-26 NOTE — Telephone Encounter (Signed)
New problem    Pt is seeing Lawson Fiscal in September. He wants to know if he needs to have lab work done before this visit.

## 2012-12-26 NOTE — Telephone Encounter (Signed)
That would be fine!  Thanks! 

## 2013-01-02 LAB — REMOTE PACEMAKER DEVICE
AL AMPLITUDE: 2.8 mv
AL IMPEDENCE PM: 609 Ohm
ATRIAL PACING PM: 19
RV LEAD IMPEDENCE PM: 674 Ohm
RV LEAD THRESHOLD: 0.5 V

## 2013-01-10 ENCOUNTER — Encounter: Payer: Self-pay | Admitting: *Deleted

## 2013-01-15 NOTE — Telephone Encounter (Signed)
Follow Up     Pt calling to follow up on possible lab work for upcoming appt.

## 2013-01-15 NOTE — Addendum Note (Signed)
Addended by: Dennis Bast F on: 01/15/2013 05:15 PM   Modules accepted: Orders

## 2013-01-15 NOTE — Telephone Encounter (Signed)
Patient called, okay to have labs prior to appointment.  He will come in on Mon 02/05/13 at 8:30

## 2013-01-16 ENCOUNTER — Other Ambulatory Visit: Payer: Self-pay | Admitting: Nurse Practitioner

## 2013-01-16 DIAGNOSIS — N401 Enlarged prostate with lower urinary tract symptoms: Secondary | ICD-10-CM | POA: Diagnosis not present

## 2013-01-23 DIAGNOSIS — N402 Nodular prostate without lower urinary tract symptoms: Secondary | ICD-10-CM | POA: Diagnosis not present

## 2013-01-23 DIAGNOSIS — Z125 Encounter for screening for malignant neoplasm of prostate: Secondary | ICD-10-CM | POA: Diagnosis not present

## 2013-01-23 DIAGNOSIS — N401 Enlarged prostate with lower urinary tract symptoms: Secondary | ICD-10-CM | POA: Diagnosis not present

## 2013-02-05 ENCOUNTER — Other Ambulatory Visit (INDEPENDENT_AMBULATORY_CARE_PROVIDER_SITE_OTHER): Payer: Medicare Other

## 2013-02-05 DIAGNOSIS — I10 Essential (primary) hypertension: Secondary | ICD-10-CM | POA: Diagnosis not present

## 2013-02-05 DIAGNOSIS — E7800 Pure hypercholesterolemia, unspecified: Secondary | ICD-10-CM

## 2013-02-05 LAB — HEPATIC FUNCTION PANEL
ALT: 17 U/L (ref 0–53)
AST: 20 U/L (ref 0–37)
Albumin: 3.7 g/dL (ref 3.5–5.2)
Alkaline Phosphatase: 44 U/L (ref 39–117)
Total Protein: 6.5 g/dL (ref 6.0–8.3)

## 2013-02-05 LAB — BASIC METABOLIC PANEL
Calcium: 9.6 mg/dL (ref 8.4–10.5)
Creatinine, Ser: 1 mg/dL (ref 0.4–1.5)
GFR: 75.93 mL/min (ref >60.00)
Sodium: 141 mEq/L (ref 135–145)

## 2013-02-05 LAB — LIPID PANEL
Cholesterol: 122 mg/dL (ref 0–200)
HDL: 40.2 mg/dL (ref >39.00)
Triglycerides: 90 mg/dL (ref 0.0–149.0)

## 2013-02-17 ENCOUNTER — Other Ambulatory Visit: Payer: Self-pay | Admitting: Internal Medicine

## 2013-03-10 ENCOUNTER — Other Ambulatory Visit: Payer: Self-pay | Admitting: Internal Medicine

## 2013-03-19 ENCOUNTER — Encounter: Payer: Self-pay | Admitting: Nurse Practitioner

## 2013-03-19 ENCOUNTER — Ambulatory Visit (INDEPENDENT_AMBULATORY_CARE_PROVIDER_SITE_OTHER): Payer: Medicare Other | Admitting: Nurse Practitioner

## 2013-03-19 VITALS — BP 110/70 | HR 68 | Ht 69.0 in | Wt 189.0 lb

## 2013-03-19 DIAGNOSIS — I10 Essential (primary) hypertension: Secondary | ICD-10-CM | POA: Diagnosis not present

## 2013-03-19 DIAGNOSIS — E785 Hyperlipidemia, unspecified: Secondary | ICD-10-CM | POA: Diagnosis not present

## 2013-03-19 DIAGNOSIS — I2510 Atherosclerotic heart disease of native coronary artery without angina pectoris: Secondary | ICD-10-CM

## 2013-03-19 NOTE — Progress Notes (Signed)
Robert Ramsey Date of Birth: [birth year] Medical Record #[id number]  History of Present Illness: Robert Ramsey is seen back today for a follow up visit. Seen for Dr. Johney Frame. He is a former patient of Dr. Ronnald Nian. He has mild CAD, HTN, HLD and pacemaker in place for Mobitz II AV block. Tolerates only low dose statin due to memory issues. He has had remote cath from 2007 showing mild diffuse CAD. He has had prior right partial nephrectomy for a renal mass by Dr. Vernie Ammons back in 2012.   Last seen 9 months ago by Dr. Johney Frame - was doing ok.   Comes back today. Here with his wife. Doing ok. Still has issues with fatigue - but this is not new. No chest pain. Not short of breath. Walking about a mile at a time but just a few times a day. Has had recent labs and they were ok. Has had follow up with [facility] which has been stable. BP at home is ok. He is tolerating his current regimen. He is more concerned about insurance changes - he is already on a "HOLD" list for his repeat colonoscopy. He is not sure who he will be allowed to see after the first of the year.   Current Outpatient Prescriptions  Medication Sig Dispense Refill  . BABY ASPIRIN PO Take 81 mg by mouth daily.       . fesoterodine (TOVIAZ) 8 MG TB24 Take 8 mg by mouth daily.      . metoprolol (LOPRESSOR) 50 MG tablet TAKE 1 TABLET TWICE A DAY  180 tablet  3  . Multiple Vitamin (MULTIVITAMIN) tablet Take 1 tablet by mouth daily.        Marland Kitchen omeprazole (PRILOSEC) 20 MG capsule TAKE 1 CAPSULE (20 MG TOTAL) BY MOUTH DAILY.  90 capsule  3  . rosuvastatin (CRESTOR) 5 MG tablet 1/2 tablet po on Monday, Wednesday, and Fridays  30 tablet  5  . ZETIA 10 MG tablet TAKE 1 TABLET (10 MG TOTAL) BY MOUTH DAILY.  90 tablet  0   No current facility-administered medications for this visit.    Allergies  Allergen Reactions  . Cefpodoxime Proxetil     REACTION: Colitis  . Flexeril [Cyclobenzaprine Hcl]   . Imodium [Loperamide Hcl]   . Levofloxacin   . Nabumetone       REACTION: Heart issues  . Naproxen     REACTION: Reaction not known  . Nsaids     REACTION: Reaction not known  . Vantin   . Vioxx [Rofecoxib]     Past Medical History  Diagnosis Date  . HTN (hypertension)   . Mobitz (type) II atrioventricular block May 2011    has PTVP in place  . Ventral hernia   . History of colonoscopy   . Memory impairment     better with lower dose statin  . PVC's (premature ventricular contractions)   . Abnormal stress test 09/15/10    SEPTAL DEFECT SECONDARY TO PREVIOUS INFARCT VS LBBB; MILD SEPTAL ISCHEMIA  . CAD (coronary artery disease)     cath in 2007 reveals mild diffuse CAD  . Glucose intolerance (impaired glucose tolerance)     Past Surgical History  Procedure Laterality Date  . Right nephrectomy  01/2006    partial for mass  . Cataract surgery  06/2009  . Appendectomy    . Pacemaker insertion  11/18/09    MDT implanted by Dr Deborah Chalk  . Cardiac catheterization  07/29/2005    EF  50-55%; Has mild diffuse CAD  . Lbbb      History  Smoking status  . Former Smoker  . Quit date: 07/06/1983  Smokeless tobacco  . Not on file    Comment: stopped smoking in 1985    History  Alcohol Use No    Family History  Problem Relation Age of Onset  . Heart failure Father   . Leukemia Mother     Review of Systems: The review of systems is per the HPI.  All other systems were reviewed and are negative.  Physical Exam: BP 110/70  Pulse 68  Ht 5\' 9"  (1.753 m)  Wt 189 lb (85.73 kg)  BMI 27.9 kg/m2 Patient is very pleasant and in no acute distress. Weight is unchanged. Skin is warm and dry. Color is normal.  HEENT is unremarkable. Normocephalic/atraumatic. PERRL. Sclera are nonicteric. Neck is supple. No masses. No JVD. Lungs are clear. Cardiac exam shows a regular rate and rhythm. Abdomen is soft. Extremities are without edema. Gait and ROM are intact. No gross neurologic deficits noted.  LABORATORY DATA:  Lab Results  Component Value  Date   GLUCOSE 98 02/05/2013   CHOL 122 02/05/2013   TRIG 90.0 02/05/2013   HDL 40.20 02/05/2013   LDLCALC 64 02/05/2013   ALT 17 02/05/2013   AST 20 02/05/2013   NA 141 02/05/2013   K 4.2 02/05/2013   CL 110 02/05/2013   CREATININE 1.0 02/05/2013   BUN 21 02/05/2013   CO2 26 02/05/2013     Assessment / Plan:  1. CAD - mild/diffuse disease per remote cath - doing well clinically.   2. HTN - BP is good - no change on current regimen.   3. HLD - tolerates only low dose statin therapy - just had recent labs. Will recheck in 6 months.   4. PPM - followed by Dr. Johney Frame - will change his follow up to March of 2015. He has a phone check later this month.   5. Past renal mass - followed by [facility]  Patient is agreeable to this plan and will call if any problems develop in the interim.   [name], RN, ANP-C [facility] [address] [place], [place]  [address]

## 2013-03-19 NOTE — Patient Instructions (Addendum)
I think you are doing well  Stay active  Stay on your current medicines  See Dr. Johney Frame in March of 2015  Get fasting labs in February of 2015 (BMET, HPF, Lipids)  Do your phone pacemaker check later this month as planned  Call the [facility] office at [phone] if you have any questions, problems or concerns.

## 2013-04-02 ENCOUNTER — Ambulatory Visit (INDEPENDENT_AMBULATORY_CARE_PROVIDER_SITE_OTHER): Payer: Medicare Other | Admitting: *Deleted

## 2013-04-02 DIAGNOSIS — I441 Atrioventricular block, second degree: Secondary | ICD-10-CM | POA: Diagnosis not present

## 2013-04-02 LAB — REMOTE PACEMAKER DEVICE
AL AMPLITUDE: 2.8 mv
AL IMPEDENCE PM: 619 Ohm
AL THRESHOLD: 0.5 V
ATRIAL PACING PM: 20
RV LEAD IMPEDENCE PM: 656 Ohm
RV LEAD THRESHOLD: 0.375 V

## 2013-04-04 ENCOUNTER — Encounter: Payer: Self-pay | Admitting: *Deleted

## 2013-04-12 ENCOUNTER — Encounter: Payer: Self-pay | Admitting: Internal Medicine

## 2013-04-12 ENCOUNTER — Other Ambulatory Visit: Payer: Self-pay | Admitting: Nurse Practitioner

## 2013-04-25 ENCOUNTER — Ambulatory Visit (INDEPENDENT_AMBULATORY_CARE_PROVIDER_SITE_OTHER): Payer: Medicare Other | Admitting: Internal Medicine

## 2013-04-25 DIAGNOSIS — Z23 Encounter for immunization: Secondary | ICD-10-CM | POA: Diagnosis not present

## 2013-05-14 ENCOUNTER — Other Ambulatory Visit: Payer: Self-pay | Admitting: Gastroenterology

## 2013-05-14 DIAGNOSIS — Z86010 Personal history of colonic polyps: Secondary | ICD-10-CM | POA: Diagnosis not present

## 2013-05-14 DIAGNOSIS — D126 Benign neoplasm of colon, unspecified: Secondary | ICD-10-CM | POA: Diagnosis not present

## 2013-05-14 DIAGNOSIS — Z09 Encounter for follow-up examination after completed treatment for conditions other than malignant neoplasm: Secondary | ICD-10-CM | POA: Diagnosis not present

## 2013-06-02 ENCOUNTER — Other Ambulatory Visit: Payer: Self-pay | Admitting: Internal Medicine

## 2013-06-18 ENCOUNTER — Ambulatory Visit (INDEPENDENT_AMBULATORY_CARE_PROVIDER_SITE_OTHER): Payer: Medicare Other | Admitting: Internal Medicine

## 2013-06-18 ENCOUNTER — Encounter: Payer: Self-pay | Admitting: Internal Medicine

## 2013-06-18 VITALS — BP 136/80 | HR 72 | Ht 69.0 in | Wt 187.2 lb

## 2013-06-18 DIAGNOSIS — I441 Atrioventricular block, second degree: Secondary | ICD-10-CM

## 2013-06-18 DIAGNOSIS — Z950 Presence of cardiac pacemaker: Secondary | ICD-10-CM

## 2013-06-18 DIAGNOSIS — E785 Hyperlipidemia, unspecified: Secondary | ICD-10-CM

## 2013-06-18 DIAGNOSIS — I10 Essential (primary) hypertension: Secondary | ICD-10-CM

## 2013-06-18 LAB — MDC_IDC_ENUM_SESS_TYPE_INCLINIC
Battery Impedance: 131 Ohm
Battery Remaining Longevity: 131 mo
Battery Voltage: 2.79 V
Brady Statistic AP VS Percent: 0 %
Brady Statistic AS VP Percent: 80 %
Date Time Interrogation Session: 20141215131146
Lead Channel Pacing Threshold Amplitude: 0.5 V
Lead Channel Pacing Threshold Amplitude: 0.5 V
Lead Channel Pacing Threshold Pulse Width: 0.4 ms
Lead Channel Sensing Intrinsic Amplitude: 4 mV
Lead Channel Setting Pacing Amplitude: 2 V
Lead Channel Setting Pacing Amplitude: 2.5 V
Lead Channel Setting Pacing Pulse Width: 0.4 ms

## 2013-06-18 MED ORDER — EZETIMIBE 10 MG PO TABS: 10.0000 mg | ORAL_TABLET | Freq: Every day | ORAL | Status: DC

## 2013-06-18 NOTE — Patient Instructions (Addendum)
Remote monitoring is used to monitor your pacemaker from home. This monitoring reduces the number of office visits required to check your device to one time per year. It allows [place] to keep an eye on the functioning of your device to ensure it is working properly. You are scheduled for a device check from home on 09-19-2013. You may send your transmission at any time that day. If you have a wireless device, the transmission will be sent automatically. After your physician reviews your transmission, you will receive a postcard with your next transmission date.  Your physician wants you to follow-up in: 6 months with Norma Fredrickson, NP.  You will receive a reminder letter in the mail two months in advance. If you don't receive a letter, please call our office to schedule the follow-up appointment.  Your physician wants you to follow-up in: 1 year with Dr. Johney Frame.  You will receive a reminder letter in the mail two months in advance. If you don't receive a letter, please call our office to schedule the follow-up appointment.

## 2013-06-18 NOTE — Progress Notes (Signed)
PCP: Margaree Mackintosh, MD  Robert Ramsey is a 72 y.o. male who presents today for routine electrophysiology followup.  Since last being seen in our clinic, the patient reports doing very well.  Today, he denies symptoms of palpitations, chest pain, shortness of breath,  lower extremity edema, dizziness, presyncope, or syncope.  The patient is otherwise without complaint today.   Past Medical History  Diagnosis Date  . HTN (hypertension)   . Complete heart block May 2011    has PTVP in place  . Ventral hernia   . History of colonoscopy   . Memory impairment     better with lower dose statin  . PVC's (premature ventricular contractions)   . Abnormal stress test 09/15/10    SEPTAL DEFECT SECONDARY TO PREVIOUS INFARCT VS LBBB; MILD SEPTAL ISCHEMIA  . CAD (coronary artery disease)     cath in 2007 reveals mild diffuse CAD  . Glucose intolerance (impaired glucose tolerance)    Past Surgical History  Procedure Laterality Date  . Right nephrectomy  01/2006    partial for mass  . Cataract surgery  06/2009  . Appendectomy    . Pacemaker insertion  11/18/09    MDT implanted by Dr Deborah Chalk  . Cardiac catheterization  07/29/2005    EF 50-55%; Has mild diffuse CAD  . Lbbb      Current Outpatient Prescriptions  Medication Sig Dispense Refill  . aspirin 81 MG tablet Take 81 mg by mouth daily.      . CRESTOR 5 MG tablet TAKE 1/2 TABLET BY MOUTH ON MONDAY, WEDNESDAY, AND FRIDAYS  30 tablet  1  . fesoterodine (TOVIAZ) 8 MG TB24 Take 8 mg by mouth daily.      . metoprolol (LOPRESSOR) 50 MG tablet TAKE 1 TABLET TWICE A DAY  180 tablet  3  . Multiple Vitamin (MULTIVITAMIN) tablet Take 1 tablet by mouth daily.        Marland Kitchen omeprazole (PRILOSEC) 20 MG capsule TAKE 1 CAPSULE (20 MG TOTAL) BY MOUTH DAILY.  90 capsule  3  . ZETIA 10 MG tablet TAKE 1 TABLET (10 MG TOTAL) BY MOUTH DAILY.  90 tablet  0   No current facility-administered medications for this visit.    Physical Exam: Filed Vitals:   06/18/13  1123  BP: 136/80  Pulse: 72  Height: 5\' 9"  (1.753 m)  Weight: 187 lb 3.2 oz (84.913 kg)    GEN- The patient is well appearing, alert and oriented x 3 today.   Head- normocephalic, atraumatic Eyes-  Sclera clear, conjunctiva pink Ears- hearing intact Oropharynx- clear Lungs- Clear to ausculation bilaterally, normal work of breathing Chest- pacemaker pocket is well healed Heart- Regular rate and rhythm, no murmurs, rubs or gallops, PMI not laterally displaced GI- soft, NT, ND, + BS Extremities- no clubbing, cyanosis, or edema, R leg support hose in place  Pacemaker interrogation- reviewed in detail today,  See PACEART report  Assessment and Plan:  1. Complete heart block Normal pacemaker function See Pace Art report No changes today  2. HTN Stable No change required today  3. HL Lipids from 8/14 are reviewed with the patient today  [facility] Return to see Lawson Fiscal in 6 months I will see in a year

## 2013-06-27 ENCOUNTER — Encounter: Payer: Self-pay | Admitting: Internal Medicine

## 2013-07-03 ENCOUNTER — Ambulatory Visit
Admission: RE | Admit: 2013-07-03 | Discharge: 2013-07-03 | Disposition: A | Discharge status: Home / Self Care | Payer: Medicare Other | Source: Ambulatory Visit | Attending: Internal Medicine | Admitting: Internal Medicine

## 2013-07-03 ENCOUNTER — Ambulatory Visit (INDEPENDENT_AMBULATORY_CARE_PROVIDER_SITE_OTHER): Payer: Medicare Other | Admitting: Internal Medicine

## 2013-07-03 ENCOUNTER — Telehealth: Payer: Self-pay | Admitting: Internal Medicine

## 2013-07-03 ENCOUNTER — Encounter: Payer: Self-pay | Admitting: Internal Medicine

## 2013-07-03 VITALS — BP 130/76 | HR 92 | Temp 99.2°F | Wt 188.0 lb

## 2013-07-03 DIAGNOSIS — R059 Cough, unspecified: Secondary | ICD-10-CM

## 2013-07-03 DIAGNOSIS — I2510 Atherosclerotic heart disease of native coronary artery without angina pectoris: Secondary | ICD-10-CM

## 2013-07-03 DIAGNOSIS — R509 Fever, unspecified: Secondary | ICD-10-CM | POA: Diagnosis not present

## 2013-07-03 DIAGNOSIS — R0602 Shortness of breath: Secondary | ICD-10-CM | POA: Diagnosis not present

## 2013-07-03 DIAGNOSIS — J029 Acute pharyngitis, unspecified: Secondary | ICD-10-CM

## 2013-07-03 DIAGNOSIS — R05 Cough: Secondary | ICD-10-CM | POA: Diagnosis not present

## 2013-07-03 LAB — INFLUENZA A AND B: Inflenza A Ag: NEGATIVE

## 2013-07-03 LAB — POCT RAPID STREP A (OFFICE): Rapid Strep A Screen: NEGATIVE

## 2013-07-03 MED ORDER — BENZONATATE 100 MG PO CAPS: 200.0000 mg | ORAL_CAPSULE | Freq: Three times a day (TID) | ORAL | Status: DC

## 2013-07-03 MED ORDER — AZITHROMYCIN 250 MG PO TABS: ORAL_TABLET | ORAL | Status: DC

## 2013-07-03 NOTE — Telephone Encounter (Signed)
Patient and wife advised of our holiday hours and to call if he's not better.  Verbalized understanding of these instructions.

## 2013-07-09 ENCOUNTER — Other Ambulatory Visit: Payer: Self-pay | Admitting: Dermatology

## 2013-07-09 DIAGNOSIS — D485 Neoplasm of uncertain behavior of skin: Secondary | ICD-10-CM | POA: Diagnosis not present

## 2013-07-09 DIAGNOSIS — L820 Inflamed seborrheic keratosis: Secondary | ICD-10-CM | POA: Diagnosis not present

## 2013-08-11 ENCOUNTER — Telehealth: Payer: Self-pay | Admitting: Cardiology

## 2013-08-11 NOTE — Telephone Encounter (Signed)
Pt's wife called. Robert Ramsey became dizzy and pale after getting up to use the bathroom this am. No chest pain or tachycardia. His B/P at was 162/88. He is not a diabetic. His pacemaker was functioning normally in Dec 2014. I suggested she bring him to the ER if he still felt poorly.  Kerin Ransom PA-C 08/11/2013 9:33 AM

## 2013-08-17 ENCOUNTER — Ambulatory Visit
Admission: RE | Admit: 2013-08-17 | Discharge: 2013-08-17 | Disposition: A | Discharge status: Home / Self Care | Payer: Medicare Other | Source: Ambulatory Visit | Attending: Internal Medicine | Admitting: Internal Medicine

## 2013-08-17 ENCOUNTER — Encounter: Payer: Self-pay | Admitting: Internal Medicine

## 2013-08-17 ENCOUNTER — Ambulatory Visit (INDEPENDENT_AMBULATORY_CARE_PROVIDER_SITE_OTHER): Payer: Medicare Other | Admitting: Internal Medicine

## 2013-08-17 VITALS — BP 130/82 | HR 68 | Temp 98.0°F | Wt 187.0 lb

## 2013-08-17 DIAGNOSIS — M79609 Pain in unspecified limb: Secondary | ICD-10-CM

## 2013-08-17 DIAGNOSIS — M25579 Pain in unspecified ankle and joints of unspecified foot: Secondary | ICD-10-CM

## 2013-08-17 DIAGNOSIS — B353 Tinea pedis: Secondary | ICD-10-CM

## 2013-08-17 DIAGNOSIS — M19079 Primary osteoarthritis, unspecified ankle and foot: Secondary | ICD-10-CM | POA: Diagnosis not present

## 2013-08-17 DIAGNOSIS — M79671 Pain in right foot: Secondary | ICD-10-CM

## 2013-08-17 DIAGNOSIS — I2510 Atherosclerotic heart disease of native coronary artery without angina pectoris: Secondary | ICD-10-CM

## 2013-08-17 LAB — CBC WITH DIFFERENTIAL/PLATELET
HCT: 42.5 % (ref 39.0–52.0)
HEMOGLOBIN: 13.9 g/dL (ref 13.0–17.0)
LYMPHS ABS: 1.7 10*3/uL (ref 0.7–4.0)
LYMPHS PCT: 26 % (ref 12–46)
MCH: 31.1 pg (ref 26.0–34.0)
MCHC: 32.7 g/dL (ref 30.0–36.0)
MCV: 95.1 fL (ref 78.0–100.0)
Monocytes Absolute: 0.3 10*3/uL (ref 0.1–1.0)
Monocytes Relative: 5 % (ref 3–12)
NEUTROS PCT: 69 % (ref 43–77)
Neutro Abs: 4.6 10*3/uL (ref 1.7–7.7)
Platelets: 207 10*3/uL (ref 150–400)
RBC: 4.47 MIL/uL (ref 4.22–5.81)
RDW: 13.6 % (ref 11.5–15.5)
WBC: 6.7 10*3/uL (ref 4.0–10.5)

## 2013-08-17 LAB — SEDIMENTATION RATE: SED RATE: 42 mm/h — AB (ref 0–16)

## 2013-08-17 MED ORDER — ECONAZOLE NITRATE 1 % EX CREA: TOPICAL_CREAM | Freq: Every day | CUTANEOUS | Status: DC

## 2013-08-17 NOTE — Patient Instructions (Addendum)
Have foot  X-rayed and await lab results. Use cream for tinea pedis on feet.

## 2013-08-17 NOTE — Progress Notes (Signed)
Patient informed. 

## 2013-08-23 LAB — URIC ACID: Uric Acid, Serum: 6.6 mg/dL (ref 4.0–7.8)

## 2013-09-11 ENCOUNTER — Telehealth: Payer: Self-pay | Admitting: Internal Medicine

## 2013-09-11 NOTE — Telephone Encounter (Addendum)
Called and spoke with the patient and his wife.  Will call in a 90 day supply after we speak with the representative.  They are going to drop off letter that explains problem and who we need to call.  It has to do with his Crestor.  He is on 2.5mg  3 times per week So he only truly needs 18 pills for a 3 month supply and we have been giving him 20.  They are not happy with this dose.  We will call once we receive the letter

## 2013-09-11 NOTE — Telephone Encounter (Signed)
New message   Patient is asking for the nurse to call him regarding this. Stated this is not a prior authorization.    Has some questions regarding a letter he receive - crestor formally . As to why patient is on odd dosage.

## 2013-09-12 ENCOUNTER — Telehealth: Payer: Self-pay | Admitting: Internal Medicine

## 2013-09-12 MED ORDER — ROSUVASTATIN CALCIUM 5 MG PO TABS: 2.5000 mg | ORAL_TABLET | ORAL | Status: DC

## 2013-09-12 NOTE — Telephone Encounter (Signed)
I have put the RX in for 5mg  take 2.5mg  as directed  #30 with 2 refills.  I spoke with Maudie Mercury about this and she feels it was due to the quantity and how the RX was written.  Left message for patient that will try this first and see if this works

## 2013-09-12 NOTE — Telephone Encounter (Signed)
New messages  Raquel Sarna @ [facility] called to confirm RX :  Crestor  1/2 tab by mouth as directed.   She needs the directions to bill the medication to his insurance.

## 2013-09-12 NOTE — Telephone Encounter (Signed)
Spoke with pharmacy and they will only give him a #30 day supply which in his case would be 6 tablets a month

## 2013-09-12 NOTE — Telephone Encounter (Signed)
Walk in pt Form " sealed Envelope" Dropped Off gave to [employer]

## 2013-09-12 NOTE — Telephone Encounter (Signed)
Letter received and will forward to [employer]

## 2013-09-13 ENCOUNTER — Telehealth: Payer: Self-pay | Admitting: Internal Medicine

## 2013-09-13 NOTE — Telephone Encounter (Signed)
New message    Wife calling need to talk with nurse further - pharmacy.

## 2013-09-13 NOTE — Telephone Encounter (Addendum)
Spoke with wife again and let her know his insurance will only cover a 30 day supply  We will try and help in any way we can

## 2013-09-17 ENCOUNTER — Telehealth: Payer: Self-pay | Admitting: *Deleted

## 2013-09-17 NOTE — Telephone Encounter (Signed)
[facility] states PA not required at this time for crestor.

## 2013-09-17 NOTE — Telephone Encounter (Signed)
PA to [facility] for crestor, dose for crestor is 2.5 mg daily

## 2013-09-19 ENCOUNTER — Ambulatory Visit (INDEPENDENT_AMBULATORY_CARE_PROVIDER_SITE_OTHER): Payer: Medicare Other | Admitting: *Deleted

## 2013-09-19 DIAGNOSIS — I442 Atrioventricular block, complete: Secondary | ICD-10-CM | POA: Diagnosis not present

## 2013-09-19 LAB — MDC_IDC_ENUM_SESS_TYPE_REMOTE
Battery Impedance: 100 Ohm
Battery Remaining Longevity: 150 mo
Battery Voltage: 2.79 V
Brady Statistic AP VP Percent: 4 %
Brady Statistic AP VS Percent: 14 %
Brady Statistic AS VP Percent: 31 %
Brady Statistic AS VS Percent: 50 %
Date Time Interrogation Session: 20120515200206
Lead Channel Impedance Value: 563 Ohm
Lead Channel Impedance Value: 640 Ohm
Lead Channel Pacing Threshold Amplitude: 0.375 V
Lead Channel Pacing Threshold Amplitude: 0.375 V
Lead Channel Pacing Threshold Pulse Width: 0.4 ms
Lead Channel Pacing Threshold Pulse Width: 0.4 ms
Lead Channel Sensing Intrinsic Amplitude: 2.8 mV
Lead Channel Setting Pacing Amplitude: 2 V
Lead Channel Setting Pacing Amplitude: 2.5 V
Lead Channel Setting Pacing Pulse Width: 0.46 ms
Lead Channel Setting Sensing Sensitivity: 4 mV

## 2013-09-24 DIAGNOSIS — H16109 Unspecified superficial keratitis, unspecified eye: Secondary | ICD-10-CM | POA: Diagnosis not present

## 2013-09-24 DIAGNOSIS — H259 Unspecified age-related cataract: Secondary | ICD-10-CM | POA: Diagnosis not present

## 2013-09-24 DIAGNOSIS — Z961 Presence of intraocular lens: Secondary | ICD-10-CM | POA: Diagnosis not present

## 2013-09-24 DIAGNOSIS — H04129 Dry eye syndrome of unspecified lacrimal gland: Secondary | ICD-10-CM | POA: Diagnosis not present

## 2013-10-01 NOTE — Progress Notes (Signed)
PPM remote 

## 2013-10-03 ENCOUNTER — Encounter: Payer: Self-pay | Admitting: *Deleted

## 2013-10-12 ENCOUNTER — Encounter: Payer: Self-pay | Admitting: Internal Medicine

## 2013-11-07 ENCOUNTER — Telehealth: Payer: Self-pay | Admitting: *Deleted

## 2013-11-07 NOTE — Telephone Encounter (Signed)
Lm to call back to reschedule appointment for [facility]

## 2013-12-03 NOTE — Patient Instructions (Addendum)
Take Zithromax Z-PAK as directed. Flu swabs, chest x-ray and rapid strep screen are negative. Call if not better in 3-5 days or sooner if worse.

## 2013-12-03 NOTE — Progress Notes (Signed)
   Subjective:    Patient ID: Robert Ramsey, male    DOB: September 20, 1940, 73 y.o.   MRN: [id number]  HPI  Sore throat, fever, and earache with malaise. Symptoms present for a couple of days. No significant cough.    Review of Systems     Objective:   Physical Exam TMs are clear. Neck is supple without adenopathy. Chest clear to auscultation. Pharynx is slightly injected. Rapid strep screen negative. Swabs for influenza are negative. Chest x-ray is negative.        Assessment & Plan:  Viral syndrome  Plan: Zithromax Z-Pak- take 2 tablets day one followed by 1 tablet days 2 through 5. Tylenol as needed for fever. Call if not better in 3-5 days or sooner if worse.

## 2013-12-17 ENCOUNTER — Ambulatory Visit: Payer: Medicare Other | Admitting: Nurse Practitioner

## 2013-12-24 ENCOUNTER — Ambulatory Visit (INDEPENDENT_AMBULATORY_CARE_PROVIDER_SITE_OTHER): Payer: Medicare Other | Admitting: *Deleted

## 2013-12-24 DIAGNOSIS — I442 Atrioventricular block, complete: Secondary | ICD-10-CM | POA: Diagnosis not present

## 2013-12-24 DIAGNOSIS — Z950 Presence of cardiac pacemaker: Secondary | ICD-10-CM

## 2013-12-24 NOTE — Progress Notes (Signed)
Remote pacemaker transmission.   

## 2013-12-25 ENCOUNTER — Ambulatory Visit (INDEPENDENT_AMBULATORY_CARE_PROVIDER_SITE_OTHER): Payer: Medicare Other | Admitting: Nurse Practitioner

## 2013-12-25 ENCOUNTER — Encounter: Payer: Self-pay | Admitting: Nurse Practitioner

## 2013-12-25 VITALS — BP 120/60 | HR 65 | Ht 69.0 in | Wt 191.2 lb

## 2013-12-25 DIAGNOSIS — I10 Essential (primary) hypertension: Secondary | ICD-10-CM

## 2013-12-25 DIAGNOSIS — I441 Atrioventricular block, second degree: Secondary | ICD-10-CM

## 2013-12-25 DIAGNOSIS — I2510 Atherosclerotic heart disease of native coronary artery without angina pectoris: Secondary | ICD-10-CM | POA: Diagnosis not present

## 2013-12-25 MED ORDER — EZETIMIBE 10 MG PO TABS
10.0000 mg | ORAL_TABLET | Freq: Every day | ORAL | Status: DC
Start: 2013-12-25 — End: 2014-07-12

## 2013-12-25 MED ORDER — METOPROLOL TARTRATE 50 MG PO TABS
50.0000 mg | ORAL_TABLET | Freq: Two times a day (BID) | ORAL | Status: DC
Start: 2013-12-25 — End: 2014-07-12

## 2013-12-25 MED ORDER — ROSUVASTATIN CALCIUM 5 MG PO TABS: 2.5000 mg | ORAL_TABLET | ORAL | Status: DC

## 2013-12-25 NOTE — Progress Notes (Signed)
Robert Ramsey Date of Birth: Sep 12, 1940 Medical Record #[id number]  History of Present Illness: Robert Ramsey is seen back today for a follow up visit. This is a 6 month check. Seen for Dr. Rayann Ramsey. He is a former patient of Dr. Susa Ramsey. He has mild CAD, HTN, HLD and pacemaker in place for Mobitz II AV block. Tolerates only low dose statin due to memory issues. He has had remote cath from 2007 showing mild diffuse CAD. He has had prior right partial nephrectomy for a renal mass by Dr. Karsten Ramsey back in 2012.   Last seen in December by Dr. Rayann Ramsey - was doing ok.   Comes back today. Here with his wife. Doing ok. Not very active. Lots of funerals. Remains quite vigilant about his health but not actively involved in his health. Medicine cost is an issue. Wondering about stress test, screening tests, etc. Not fasting. BP ok for the most part at home. Some vague symptoms noted on his BP diary - nothing consistent.   Current Outpatient Prescriptions  Medication Sig Dispense Refill  . aspirin 81 MG tablet Take 81 mg by mouth daily.      Marland Kitchen econazole nitrate 1 % cream Apply 1 application topically as needed.      . ezetimibe (ZETIA) 10 MG tablet Take 1 tablet (10 mg total) by mouth daily.  90 tablet  3  . fesoterodine (TOVIAZ) 8 MG TB24 Take 8 mg by mouth daily.      . metoprolol (LOPRESSOR) 50 MG tablet Take 1 tablet (50 mg total) by mouth 2 (two) times daily.  180 tablet  3  . Multiple Vitamin (MULTIVITAMIN) tablet Take 1 tablet by mouth daily.        Marland Kitchen omeprazole (PRILOSEC) 20 MG capsule TAKE 1 CAPSULE (20 MG TOTAL) BY MOUTH DAILY.  90 capsule  3  . rosuvastatin (CRESTOR) 5 MG tablet Take 0.5 tablets (2.5 mg total) by mouth as directed.  30 tablet  9   No current facility-administered medications for this visit.    Allergies  Allergen Reactions  . Cefpodoxime Proxetil     REACTION: Colitis  . Flexeril [Cyclobenzaprine Hcl]     Possible heart side effects  . Imodium [Loperamide Hcl]   .  Levofloxacin   . Nabumetone     REACTION: Heart issues  . Naproxen     Effects blood pressure and heart  . Nsaids     Effects blood pressure and heart  . Vantin     Pseudo colitis  . Vioxx [Rofecoxib]     Effects blood pressure and heart    Past Medical History  Diagnosis Date  . HTN (hypertension)   . Complete heart block May 2011    has PTVP in place  . Ventral hernia   . History of colonoscopy   . Memory impairment     better with lower dose statin  . PVC's (premature ventricular contractions)   . Abnormal stress test 09/15/10    SEPTAL DEFECT SECONDARY TO PREVIOUS INFARCT VS LBBB; MILD SEPTAL ISCHEMIA  . CAD (coronary artery disease)     cath in 2007 reveals mild diffuse CAD  . Glucose intolerance (impaired glucose tolerance)     Past Surgical History  Procedure Laterality Date  . Right nephrectomy  01/2006    partial for mass  . Cataract surgery  06/2009  . Appendectomy    . Pacemaker insertion  11/18/09    MDT implanted by Dr Robert Ramsey  . Cardiac catheterization  07/29/2005    EF 50-55%; Has mild diffuse CAD  . Lbbb      History  Smoking status  . Former Smoker  . Quit date: 07/06/1983  Smokeless tobacco  . Not on file    Comment: stopped smoking in 1985    History  Alcohol Use No    Family History  Problem Relation Age of Onset  . Heart failure Father   . Leukemia Mother     Review of Systems: The review of systems is per the HPI.  All other systems were reviewed and are negative.  Physical Exam: BP 120/60  Pulse 65  Ht 5\' 9"  (1.753 m)  Wt 191 lb 3.2 oz (86.728 kg)  BMI 28.22 kg/m2  SpO2 98% Patient is very pleasant and in no acute distress. Skin is warm and dry. Color is normal.  HEENT is unremarkable. No carotid bruits noted. Normocephalic/atraumatic. PERRL. Sclera are nonicteric. Neck is supple. No masses. No JVD. Lungs are clear. Cardiac exam shows a regular rate and rhythm. Abdomen is soft. Extremities are without edema. Gait and ROM are  intact. No gross neurologic deficits noted.  Wt Readings from Last 3 Encounters:  12/25/13 191 lb 3.2 oz (86.728 kg)  08/17/13 187 lb (84.823 kg)  07/03/13 188 lb (85.276 kg)      LABORATORY DATA: Lab Results  Component Value Date   WBC 6.7 08/17/2013   HGB 13.9 08/17/2013   HCT 42.5 08/17/2013   PLT 207 08/17/2013   GLUCOSE 98 02/05/2013   CHOL 122 02/05/2013   TRIG 90.0 02/05/2013   HDL 40.20 02/05/2013   LDLCALC 64 02/05/2013   ALT 17 02/05/2013   AST 20 02/05/2013   NA 141 02/05/2013   K 4.2 02/05/2013   CL 110 02/05/2013   CREATININE 1.0 02/05/2013   BUN 21 02/05/2013   CO2 26 02/05/2013    BNP (last 3 results) No results found for this basename: PROBNP,  in the last 8760 hours   Assessment / Plan: 1. CAD - mild/diffuse disease per remote cath - doing ok clinically. Needs to really work on CV risk factor modification - I do not think noninvasive testing is warranted at this time. I will see him back in a year.  2. HTN - BP is good - no change on current regimen.   3. HLD - tolerates only low dose statin therapy - needs labs Friday. Try to give samples today  4. PPM - followed by Dr. Rayann Ramsey - sees him in December 2015  5. Past renal mass - followed by [facility]   Wanting generalized screening. Will arrange for the vascular screening.   Patient is agreeable to this plan and will call if any problems develop in the interim.   Robert Junes, RN, [place]  [address] [place]  [place], [place] [address]

## 2013-12-25 NOTE — Patient Instructions (Addendum)
Stay on your current medicines  Samples of Zetia and Crestor if available  Will set up vascular screening  Think about what we talked about today  Lab later this week.  See Dr. Rayann Heman in December  I will see you next June 2015  Call the [place] office at [phone] if you have any questions, problems or concerns.

## 2013-12-27 ENCOUNTER — Other Ambulatory Visit: Payer: Self-pay | Admitting: Dermatology

## 2013-12-27 DIAGNOSIS — L820 Inflamed seborrheic keratosis: Secondary | ICD-10-CM | POA: Diagnosis not present

## 2013-12-27 DIAGNOSIS — L821 Other seborrheic keratosis: Secondary | ICD-10-CM | POA: Diagnosis not present

## 2013-12-28 ENCOUNTER — Other Ambulatory Visit (INDEPENDENT_AMBULATORY_CARE_PROVIDER_SITE_OTHER): Payer: Medicare Other

## 2013-12-28 DIAGNOSIS — I2510 Atherosclerotic heart disease of native coronary artery without angina pectoris: Secondary | ICD-10-CM

## 2013-12-28 DIAGNOSIS — I10 Essential (primary) hypertension: Secondary | ICD-10-CM

## 2013-12-28 DIAGNOSIS — E785 Hyperlipidemia, unspecified: Secondary | ICD-10-CM

## 2013-12-28 LAB — BASIC METABOLIC PANEL
BUN: 21 mg/dL (ref 6–23)
CO2: 24 mEq/L (ref 19–32)
Calcium: 9.4 mg/dL (ref 8.4–10.5)
Chloride: 109 mEq/L (ref 96–112)
Creatinine, Ser: 1 mg/dL (ref 0.4–1.5)
GFR: 79.33 mL/min (ref >60.00)
Glucose, Bld: 102 mg/dL — ABNORMAL HIGH (ref 70–99)
Potassium: 4.3 mEq/L (ref 3.5–5.1)
Sodium: 140 mEq/L (ref 135–145)

## 2013-12-28 LAB — LIPID PANEL
Cholesterol: 125 mg/dL (ref 0–200)
HDL: 38.5 mg/dL — ABNORMAL LOW (ref >39.00)
LDL Cholesterol: 61 mg/dL (ref 0–99)
NonHDL: 86.5
Total CHOL/HDL Ratio: 3
Triglycerides: 128 mg/dL (ref 0.0–149.0)
VLDL: 25.6 mg/dL (ref 0.0–40.0)

## 2013-12-28 LAB — HEPATIC FUNCTION PANEL
ALT: 19 U/L (ref 0–53)
AST: 22 U/L (ref 0–37)
Albumin: 3.9 g/dL (ref 3.5–5.2)
Alkaline Phosphatase: 47 U/L (ref 39–117)
Bilirubin, Direct: 0 mg/dL (ref 0.0–0.3)
Total Bilirubin: 0.5 mg/dL (ref 0.2–1.2)
Total Protein: 7 g/dL (ref 6.0–8.3)

## 2013-12-31 LAB — MDC_IDC_ENUM_SESS_TYPE_REMOTE
Battery Impedance: 131 Ohm
Battery Remaining Longevity: 132 mo
Brady Statistic AP VP Percent: 21 %
Brady Statistic AP VS Percent: 0 %
Brady Statistic AS VS Percent: 1 %
Date Time Interrogation Session: 20150622131245
Lead Channel Impedance Value: 619 Ohm
Lead Channel Pacing Threshold Amplitude: 0.375 V
Lead Channel Pacing Threshold Amplitude: 0.5 V
Lead Channel Pacing Threshold Pulse Width: 0.4 ms
Lead Channel Pacing Threshold Pulse Width: 0.4 ms
Lead Channel Setting Pacing Amplitude: 2.5 V
Lead Channel Setting Pacing Pulse Width: 0.4 ms
Lead Channel Setting Sensing Sensitivity: 2 mV
MDC IDC MSMT BATTERY VOLTAGE: 2.78 V
MDC IDC MSMT LEADCHNL RA SENSING INTR AMPL: 2.8 mV
MDC IDC MSMT LEADCHNL RV IMPEDANCE VALUE: 688 Ohm
MDC IDC SET LEADCHNL RA PACING AMPLITUDE: 2 V
MDC IDC STAT BRADY AS VP PERCENT: 79 %

## 2014-01-02 ENCOUNTER — Encounter: Payer: Self-pay | Admitting: Cardiology

## 2014-01-09 ENCOUNTER — Other Ambulatory Visit (HOSPITAL_COMMUNITY): Payer: Self-pay | Admitting: Cardiology

## 2014-01-09 ENCOUNTER — Encounter: Payer: Self-pay | Admitting: Internal Medicine

## 2014-01-09 DIAGNOSIS — Z139 Encounter for screening, unspecified: Secondary | ICD-10-CM

## 2014-01-11 DIAGNOSIS — K2100 Gastro-esophageal reflux disease with esophagitis, without bleeding: Secondary | ICD-10-CM | POA: Diagnosis not present

## 2014-01-11 DIAGNOSIS — K529 Noninfective gastroenteritis and colitis, unspecified: Secondary | ICD-10-CM | POA: Diagnosis not present

## 2014-01-17 ENCOUNTER — Ambulatory Visit (HOSPITAL_COMMUNITY): Payer: Medicare Other | Attending: Internal Medicine | Admitting: Cardiology

## 2014-01-17 DIAGNOSIS — Z139 Encounter for screening, unspecified: Secondary | ICD-10-CM

## 2014-01-17 NOTE — Progress Notes (Signed)
Carotid, Aorta and ABI screening performed.

## 2014-01-28 DIAGNOSIS — N138 Other obstructive and reflux uropathy: Secondary | ICD-10-CM | POA: Diagnosis not present

## 2014-01-28 DIAGNOSIS — N402 Nodular prostate without lower urinary tract symptoms: Secondary | ICD-10-CM | POA: Diagnosis not present

## 2014-01-28 DIAGNOSIS — N139 Obstructive and reflux uropathy, unspecified: Secondary | ICD-10-CM | POA: Diagnosis not present

## 2014-01-28 DIAGNOSIS — N401 Enlarged prostate with lower urinary tract symptoms: Secondary | ICD-10-CM | POA: Diagnosis not present

## 2014-01-28 DIAGNOSIS — Z125 Encounter for screening for malignant neoplasm of prostate: Secondary | ICD-10-CM | POA: Diagnosis not present

## 2014-02-10 NOTE — Progress Notes (Signed)
   Subjective:    Patient ID: Robert Ramsey, male    DOB: 10-03-1940, 73 y.o.   MRN: [id number]  HPI  Complaining of pain right first MTP joint. No prior history of gout. No known injury. No fever or chills. Painful to walk and onset was rather sudden.    Review of Systems     Objective:   Physical Exam  Palpable tenderness right first MTP joint. Slight erythema. Also has tinea pedis.      Assessment & Plan:  ? Gout versus cellulitis right first MTP joint  Tinea pedis-use econazole nitrate cream on feet daily until tinea pedis resolved.  Plan: X-ray right foot. CBC drawn. Recommend ice and elevation while awaiting lab results.

## 2014-03-27 ENCOUNTER — Ambulatory Visit (INDEPENDENT_AMBULATORY_CARE_PROVIDER_SITE_OTHER): Payer: Medicare Other | Admitting: *Deleted

## 2014-03-27 DIAGNOSIS — I441 Atrioventricular block, second degree: Secondary | ICD-10-CM

## 2014-03-27 LAB — MDC_IDC_ENUM_SESS_TYPE_REMOTE
Battery Impedance: 154 Ohm
Battery Voltage: 2.79 V
Brady Statistic AP VP Percent: 21 %
Brady Statistic AP VS Percent: 0 %
Brady Statistic AS VP Percent: 78 %
Brady Statistic AS VS Percent: 1 %
Date Time Interrogation Session: 20150923110328
Lead Channel Impedance Value: 611 Ohm
Lead Channel Impedance Value: 686 Ohm
Lead Channel Pacing Threshold Amplitude: 0.375 V
Lead Channel Pacing Threshold Pulse Width: 0.4 ms
Lead Channel Sensing Intrinsic Amplitude: 2.8 mV
Lead Channel Setting Pacing Amplitude: 2.5 V
MDC IDC MSMT BATTERY REMAINING LONGEVITY: 126 mo
MDC IDC MSMT LEADCHNL RV PACING THRESHOLD AMPLITUDE: 0.5 V
MDC IDC MSMT LEADCHNL RV PACING THRESHOLD PULSEWIDTH: 0.4 ms
MDC IDC SET LEADCHNL RA PACING AMPLITUDE: 2 V
MDC IDC SET LEADCHNL RV PACING PULSEWIDTH: 0.4 ms
MDC IDC SET LEADCHNL RV SENSING SENSITIVITY: 2 mV

## 2014-03-27 NOTE — Progress Notes (Signed)
Remote pacemaker transmission.   

## 2014-03-29 ENCOUNTER — Encounter: Payer: Self-pay | Admitting: Cardiology

## 2014-04-05 ENCOUNTER — Encounter: Payer: Self-pay | Admitting: Internal Medicine

## 2014-05-23 DIAGNOSIS — L438 Other lichen planus: Secondary | ICD-10-CM | POA: Diagnosis not present

## 2014-05-29 DIAGNOSIS — L438 Other lichen planus: Secondary | ICD-10-CM | POA: Diagnosis not present

## 2014-06-10 ENCOUNTER — Encounter: Payer: Medicare Other | Admitting: Internal Medicine

## 2014-06-10 ENCOUNTER — Telehealth: Payer: Self-pay

## 2014-06-10 NOTE — Telephone Encounter (Signed)
Patient did receive his flu vaccine at church in April 18, 2014.

## 2014-06-10 NOTE — Telephone Encounter (Signed)
Left message for patient to call and let us know if they have received flu vaccine.

## 2014-07-08 DIAGNOSIS — D485 Neoplasm of uncertain behavior of skin: Secondary | ICD-10-CM | POA: Diagnosis not present

## 2014-07-12 ENCOUNTER — Other Ambulatory Visit: Payer: Self-pay | Admitting: *Deleted

## 2014-07-12 MED ORDER — METOPROLOL TARTRATE 50 MG PO TABS: 50.0000 mg | ORAL_TABLET | Freq: Two times a day (BID) | ORAL | Status: DC

## 2014-07-12 MED ORDER — EZETIMIBE 10 MG PO TABS: 10.0000 mg | ORAL_TABLET | Freq: Every day | ORAL | Status: DC

## 2014-07-12 MED ORDER — ROSUVASTATIN CALCIUM 5 MG PO TABS: 2.5000 mg | ORAL_TABLET | ORAL | Status: DC

## 2014-07-26 ENCOUNTER — Encounter: Payer: Medicare Other | Admitting: Internal Medicine

## 2014-09-02 ENCOUNTER — Other Ambulatory Visit: Payer: Self-pay

## 2014-09-02 ENCOUNTER — Encounter: Payer: Self-pay | Admitting: Internal Medicine

## 2014-09-02 ENCOUNTER — Ambulatory Visit (INDEPENDENT_AMBULATORY_CARE_PROVIDER_SITE_OTHER): Payer: Medicare Other | Admitting: Internal Medicine

## 2014-09-02 VITALS — BP 142/78 | HR 81 | Ht 69.0 in | Wt 187.0 lb

## 2014-09-02 DIAGNOSIS — I442 Atrioventricular block, complete: Secondary | ICD-10-CM | POA: Diagnosis not present

## 2014-09-02 DIAGNOSIS — E785 Hyperlipidemia, unspecified: Secondary | ICD-10-CM

## 2014-09-02 DIAGNOSIS — Z950 Presence of cardiac pacemaker: Secondary | ICD-10-CM | POA: Diagnosis not present

## 2014-09-02 DIAGNOSIS — I10 Essential (primary) hypertension: Secondary | ICD-10-CM | POA: Diagnosis not present

## 2014-09-02 LAB — MDC_IDC_ENUM_SESS_TYPE_INCLINIC
Battery Impedance: 154 Ohm
Battery Remaining Longevity: 127 mo
Battery Voltage: 2.79 V
Brady Statistic AP VP Percent: 20 %
Brady Statistic AP VS Percent: 0 %
Brady Statistic AS VP Percent: 80 %
Lead Channel Impedance Value: 630 Ohm
Lead Channel Pacing Threshold Amplitude: 0.5 V
Lead Channel Pacing Threshold Amplitude: 0.5 V
Lead Channel Sensing Intrinsic Amplitude: 4 mV
Lead Channel Setting Pacing Amplitude: 2 V
Lead Channel Setting Pacing Amplitude: 2.5 V
Lead Channel Setting Pacing Pulse Width: 0.4 ms
MDC IDC MSMT LEADCHNL RA PACING THRESHOLD PULSEWIDTH: 0.4 ms
MDC IDC MSMT LEADCHNL RV IMPEDANCE VALUE: 720 Ohm
MDC IDC MSMT LEADCHNL RV PACING THRESHOLD PULSEWIDTH: 0.4 ms
MDC IDC SESS DTM: 20160229145720
MDC IDC SET LEADCHNL RV SENSING SENSITIVITY: 2 mV
MDC IDC STAT BRADY AS VS PERCENT: 1 %

## 2014-09-02 NOTE — Progress Notes (Signed)
PCP: Elby Showers, MD  Robert Ramsey is a 74 y.o. male who presents today for routine electrophysiology followup.  Since last being seen in our clinic, the patient reports doing very well. He has had a nonproductive cough without fevers/chills/sore throat/ etc. since Friday.  He is otheriwise without complaint. Today, he denies symptoms of palpitations, chest pain, shortness of breath,  lower extremity edema, dizziness, presyncope, or syncope.  The patient is otherwise without complaint today.   Past Medical History  Diagnosis Date  . HTN (hypertension)   . Complete heart block May 2011    has PTVP in place  . Ventral hernia   . History of colonoscopy   . Memory impairment     better with lower dose statin  . PVC's (premature ventricular contractions)   . Abnormal stress test 09/15/10    SEPTAL DEFECT SECONDARY TO PREVIOUS INFARCT VS LBBB; MILD SEPTAL ISCHEMIA  . CAD (coronary artery disease)     cath in 2007 reveals mild diffuse CAD  . Glucose intolerance (impaired glucose tolerance)    Past Surgical History  Procedure Laterality Date  . Right nephrectomy  01/2006    partial for mass  . Cataract surgery  06/2009  . Appendectomy    . Pacemaker insertion  11/18/09    MDT implanted by Dr Doreatha Lew  . Cardiac catheterization  07/29/2005    EF 50-55%; Has mild diffuse CAD  . Lbbb      Current Outpatient Prescriptions  Medication Sig Dispense Refill  . aspirin 81 MG tablet Take 81 mg by mouth daily.    Marland Kitchen econazole nitrate 1 % cream Apply 1 application topically daily as needed (infection).     . ezetimibe (ZETIA) 10 MG tablet Take 1 tablet (10 mg total) by mouth daily. 90 tablet 1  . fesoterodine (TOVIAZ) 8 MG TB24 Take 8 mg by mouth daily.    . metoprolol (LOPRESSOR) 50 MG tablet Take 1 tablet (50 mg total) by mouth 2 (two) times daily. 180 tablet 1  . Multiple Vitamin (MULTIVITAMIN) tablet Take 1 tablet by mouth daily.      Marland Kitchen omeprazole (PRILOSEC) 20 MG capsule TAKE 1 CAPSULE (20  MG TOTAL) BY MOUTH DAILY. 90 capsule 3  . rosuvastatin (CRESTOR) 5 MG tablet Take 0.5 tablets (2.5 mg total) by mouth as directed. 45 tablet 1   No current facility-administered medications for this visit.    Physical Exam: Filed Vitals:   09/02/14 1134  BP: 142/78  Pulse: 81  Height: 5\' 9"  (1.753 m)  Weight: 187 lb (84.823 kg)    GEN- The patient is well appearing, alert and oriented x 3 today.   Head- normocephalic, atraumatic Eyes-  Sclera clear, conjunctiva pink Ears- hearing intact Oropharynx- clear without erythema Neck- supple no LAD Lungs- Clear to ausculation bilaterally, normal work of breathing Chest- pacemaker pocket is well healed Heart- Regular rate and rhythm, no murmurs, rubs or gallops, PMI not laterally displaced GI- soft, NT, ND, + BS Extremities- no clubbing, cyanosis, or edema, R leg support hose in place  Pacemaker interrogation- reviewed in detail today,  See PACEART report  Assessment and Plan:  1. Complete heart block Normal pacemaker function See Pace Art report No changes today  2. HTN Stable No change required today 2 gram sodium restriction advised  3. HL Lipids from 6/15 are reviewed with the patient today Repeat labs in June  [facility] Return to see Cecille Rubin in 6 months I will see in a year

## 2014-09-02 NOTE — Patient Instructions (Signed)
Your physician wants you to follow-up in: 12 months with Dr. Vallery Ridge will receive a reminder letter in the mail two months in advance. If you don't receive a letter, please call our office to schedule the follow-up appointment.  Remote monitoring is used to monitor your Pacemaker or ICD from home. This monitoring reduces the number of office visits required to check your device to one time per year. It allows [place] to keep an eye on the functioning of your device to ensure it is working properly. You are scheduled for a device check from home on 12/02/2014. You may send your transmission at any time that day. If you have a wireless device, the transmission will be sent automatically. After your physician reviews your transmission, you will receive a postcard with your next transmission date.  Your physician recommends that you return for lab work in: June prior to your appointment with Kathrene Alu (liver, lipid, bmet)

## 2014-09-05 ENCOUNTER — Ambulatory Visit (INDEPENDENT_AMBULATORY_CARE_PROVIDER_SITE_OTHER): Payer: Medicare Other | Admitting: Internal Medicine

## 2014-09-05 ENCOUNTER — Encounter: Payer: Self-pay | Admitting: Internal Medicine

## 2014-09-05 VITALS — BP 128/84 | HR 76 | Temp 98.3°F | Ht 69.0 in | Wt 191.0 lb

## 2014-09-05 DIAGNOSIS — J069 Acute upper respiratory infection, unspecified: Secondary | ICD-10-CM | POA: Diagnosis not present

## 2014-09-05 MED ORDER — AZITHROMYCIN 250 MG PO TABS: ORAL_TABLET | ORAL | Status: DC

## 2014-09-05 NOTE — Patient Instructions (Signed)
Take Zithromax Z-PAK as directed. Rest and drink plenty of fluids. Call if not better in one week

## 2014-09-05 NOTE — Progress Notes (Signed)
   Subjective:    Patient ID: Robert Ramsey, male    DOB: [birth year], 73 y.o.   MRN: [id number]  HPI  Wife recently had sinus infection and was on medication. Patient came down with upper respiratory infection symptoms last week. Thought he was getting better. Was hoarse on Monday, February 29 when he saw cardiologist for pacemaker check. His chest was clear at the time. He had some coughing on the evening of March 1. Wife gave him a leftover hydrocodone tablet from another physician. That did not seem to do much good. Has now began to bring up some discolored sputum which has him worried. He has no fever or shaking chills. No sore throat or ear pain.   Review of Systems     Objective:   Physical Exam Pharynx is very slightly injected. TMs clear. Neck is supple without adenopathy. Chest completely clear to auscultation without rales or wheezing       Assessment & Plan:  Acute URI  Plan: Zithromax Z-PAK take 2 tablets day one followed by 1 tablet days 2 through 5. He does have leftover hydrocodone tablets may take 5 mg at bedtime for cough. Prefers to try this over narcotic liquid cough syrup. Needs to give this an additional week. Rest and drink plenty of fluids.

## 2014-09-12 ENCOUNTER — Telehealth: Payer: Self-pay | Admitting: Internal Medicine

## 2014-09-12 MED ORDER — AZITHROMYCIN 250 MG PO TABS: ORAL_TABLET | ORAL | Status: DC

## 2014-09-12 NOTE — Telephone Encounter (Signed)
Sent refill on zpak patient aware

## 2014-09-12 NOTE — Telephone Encounter (Signed)
He has finished the antibiotic that you prescribed.  The coughing is much better, still there.  He is better, but his teeth and gums are sore and he now feels he has a sinus infection.  Do you want to call him something in?  Or, do you want him to come back in and see you?    Phone?  [phone]  Pharmacy:  [facility] ([place])

## 2014-09-12 NOTE — Telephone Encounter (Signed)
Just refill the Z-pak. These illnesses are taking 2 weeks to resolve.

## 2014-09-26 DIAGNOSIS — H2512 Age-related nuclear cataract, left eye: Secondary | ICD-10-CM | POA: Diagnosis not present

## 2014-09-26 DIAGNOSIS — H1851 Endothelial corneal dystrophy: Secondary | ICD-10-CM | POA: Diagnosis not present

## 2014-09-26 DIAGNOSIS — H26491 Other secondary cataract, right eye: Secondary | ICD-10-CM | POA: Diagnosis not present

## 2014-09-26 DIAGNOSIS — H52203 Unspecified astigmatism, bilateral: Secondary | ICD-10-CM | POA: Diagnosis not present

## 2014-10-25 ENCOUNTER — Ambulatory Visit (INDEPENDENT_AMBULATORY_CARE_PROVIDER_SITE_OTHER): Payer: Medicare Other | Admitting: Internal Medicine

## 2014-10-25 VITALS — BP 122/78 | HR 76 | Temp 98.2°F

## 2014-10-25 DIAGNOSIS — Z23 Encounter for immunization: Secondary | ICD-10-CM | POA: Diagnosis not present

## 2014-10-25 NOTE — Progress Notes (Signed)
Patient presents for Prevnar injection. Patient VS stable . Patient tolerated injection well.

## 2014-10-29 DIAGNOSIS — H25012 Cortical age-related cataract, left eye: Secondary | ICD-10-CM | POA: Diagnosis not present

## 2014-10-29 DIAGNOSIS — H25812 Combined forms of age-related cataract, left eye: Secondary | ICD-10-CM | POA: Diagnosis not present

## 2014-10-29 DIAGNOSIS — H2512 Age-related nuclear cataract, left eye: Secondary | ICD-10-CM | POA: Diagnosis not present

## 2014-11-01 DIAGNOSIS — H5000 Unspecified esotropia: Secondary | ICD-10-CM | POA: Diagnosis not present

## 2014-11-01 DIAGNOSIS — H532 Diplopia: Secondary | ICD-10-CM | POA: Diagnosis not present

## 2014-11-25 ENCOUNTER — Other Ambulatory Visit: Payer: Self-pay | Admitting: Internal Medicine

## 2014-12-03 ENCOUNTER — Ambulatory Visit (INDEPENDENT_AMBULATORY_CARE_PROVIDER_SITE_OTHER): Payer: Medicare Other | Admitting: *Deleted

## 2014-12-03 DIAGNOSIS — I442 Atrioventricular block, complete: Secondary | ICD-10-CM | POA: Diagnosis not present

## 2014-12-03 NOTE — Progress Notes (Signed)
Remote pacemaker transmission.   

## 2014-12-08 LAB — CUP PACEART REMOTE DEVICE CHECK
Battery Impedance: 201 Ohm
Battery Remaining Longevity: 119 mo
Battery Voltage: 2.79 V
Brady Statistic AP VP Percent: 21 %
Brady Statistic AS VP Percent: 79 %
Brady Statistic AS VS Percent: 0 %
Lead Channel Impedance Value: 653 Ohm
Lead Channel Impedance Value: 702 Ohm
Lead Channel Pacing Threshold Amplitude: 0.375 V
Lead Channel Pacing Threshold Pulse Width: 0.4 ms
Lead Channel Pacing Threshold Pulse Width: 0.4 ms
Lead Channel Sensing Intrinsic Amplitude: 2.8 mV
Lead Channel Setting Pacing Pulse Width: 0.4 ms
Lead Channel Setting Sensing Sensitivity: 2 mV
MDC IDC MSMT LEADCHNL RV PACING THRESHOLD AMPLITUDE: 0.5 V
MDC IDC SESS DTM: 20160531112922
MDC IDC SET LEADCHNL RA PACING AMPLITUDE: 2 V
MDC IDC SET LEADCHNL RV PACING AMPLITUDE: 2.5 V
MDC IDC STAT BRADY AP VS PERCENT: 0 %

## 2014-12-16 ENCOUNTER — Ambulatory Visit (INDEPENDENT_AMBULATORY_CARE_PROVIDER_SITE_OTHER): Payer: Medicare Other | Admitting: Internal Medicine

## 2014-12-16 ENCOUNTER — Encounter: Payer: Self-pay | Admitting: Internal Medicine

## 2014-12-16 VITALS — BP 128/76 | HR 82 | Temp 98.4°F | Wt 190.0 lb

## 2014-12-16 DIAGNOSIS — R7309 Other abnormal glucose: Secondary | ICD-10-CM | POA: Diagnosis not present

## 2014-12-16 DIAGNOSIS — R739 Hyperglycemia, unspecified: Secondary | ICD-10-CM | POA: Diagnosis not present

## 2014-12-16 DIAGNOSIS — R413 Other amnesia: Secondary | ICD-10-CM

## 2014-12-16 LAB — CBC WITH DIFFERENTIAL/PLATELET
BASOS ABS: 0 10*3/uL (ref 0.0–0.1)
Basophils Relative: 0 % (ref 0–1)
EOS PCT: 3 % (ref 0–5)
Eosinophils Absolute: 0.2 10*3/uL (ref 0.0–0.7)
HCT: 45.1 % (ref 39.0–52.0)
HEMOGLOBIN: 15.2 g/dL (ref 13.0–17.0)
LYMPHS ABS: 1.7 10*3/uL (ref 0.7–4.0)
LYMPHS PCT: 26 % (ref 12–46)
MCH: 32.1 pg (ref 26.0–34.0)
MCHC: 33.7 g/dL (ref 30.0–36.0)
MCV: 95.1 fL (ref 78.0–100.0)
MPV: 10 fL (ref 8.6–12.4)
Monocytes Absolute: 0.6 10*3/uL (ref 0.1–1.0)
Monocytes Relative: 9 % (ref 3–12)
NEUTROS ABS: 4 10*3/uL (ref 1.7–7.7)
Neutrophils Relative %: 62 % (ref 43–77)
PLATELETS: 223 10*3/uL (ref 150–400)
RBC: 4.74 MIL/uL (ref 4.22–5.81)
RDW: 14.2 % (ref 11.5–15.5)
WBC: 6.4 10*3/uL (ref 4.0–10.5)

## 2014-12-16 LAB — COMPREHENSIVE METABOLIC PANEL
ALT: 16 U/L (ref 0–53)
AST: 20 U/L (ref 0–37)
Albumin: 4 g/dL (ref 3.5–5.2)
Alkaline Phosphatase: 50 U/L (ref 39–117)
BILIRUBIN TOTAL: 0.4 mg/dL (ref 0.2–1.2)
BUN: 18 mg/dL (ref 6–23)
CO2: 24 mEq/L (ref 19–32)
Calcium: 9.8 mg/dL (ref 8.4–10.5)
Chloride: 106 mEq/L (ref 96–112)
Creat: 0.96 mg/dL (ref 0.50–1.35)
GLUCOSE: 74 mg/dL (ref 70–99)
POTASSIUM: 4.8 meq/L (ref 3.5–5.3)
Sodium: 140 mEq/L (ref 135–145)
TOTAL PROTEIN: 6.5 g/dL (ref 6.0–8.3)

## 2014-12-16 LAB — VITAMIN B12: VITAMIN B 12: 947 pg/mL — AB (ref 211–911)

## 2014-12-16 LAB — T4, FREE: FREE T4: 1.07 ng/dL (ref 0.80–1.80)

## 2014-12-16 LAB — TSH: TSH: 2.242 u[IU]/mL (ref 0.350–4.500)

## 2014-12-16 LAB — FOLATE: Folate: >20 ng/mL

## 2014-12-16 NOTE — Addendum Note (Signed)
Addended by: Leota Jacobsen on: 12/16/2014 04:17 PM   Modules accepted: Orders

## 2014-12-16 NOTE — Progress Notes (Signed)
Subjective:    Patient ID: Robert Ramsey, male    DOB: Apr 10, 1941, 74 y.o.   MRN: [id number]  HPI 74 year old White Male in today accompanied by his wife with concerns about memory loss. A couple of family members have expressed some concern. He apparently is not quite as sharp giving directions as he use to. Sometimes when he is driving he will take the most direct route and wife is surprised at the way he actually takes to their destination.He's never gotten lost. He's never lost his car in the parking lot repeatedly. Doesn't I'll wrong phone numbers overnight of her etc. Sometimes has issues remembering exact acreage of their properties which previously he could remember well. Wife says he sleeps a lot. She is concerned about metoprolol causing fatigue and memory issues. At one point the dose of Crestor was decreased because of concern for memory loss several years ago.  In 2011, he had of CT scan that was normal and an MRI of the brain that was normal. There were memory concerns at that time. TSH was slightly elevated at slightly over 5. Dose of Crestor was decreased. Wife says that these issues have gotten more pronounced over the past 6 months and seem a bit worse after recent left cataract surgery.  History of Mobitz ll AV block pacer placed by Dr. Doreatha Ramsey in 2011 and currently followed by Dr. Rayann Ramsey and Robert Ramsey. History of hyperlipidemia. History of hypertension and for glucose tolerance. Cardiac cath 2007 showed mild diffuse coronary artery disease with ejection fraction of 50-55%. History of left bundle-branch block.  Had right kidney oncocytoma removed by urologist in 2007- see path report.  Cataract surgery December 2010. Appendectomy. History of ventral hernia. History of PVCs and abnormal stress test.  No known family members with diagnosis of Alzheimer's dementia. Patient says his paternal grandmother was admitted to mental facility in [facility] but he doesn't know exactly why.  Daughter has bipolar disorder and perhaps some memory issues related to that.  Social history: He is retired. 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 He does not smoke.  Had pacemaker placed due to bradycardia.  Current medications include aspirin 81 mg daily, Zetia 10 mg daily, Lopressor 50 mg twice daily, Crestor 5 mg taking one half tablet 3 times a week, Toviaz, Prilosec 20 mg daily.  Multiple drug intolerances including colitis caused by Cephalosporin, Flexeril-possible heart side effects, Imodium, levofloxacin, Relafen-tachycardia, Naprosyn and other NSAIDS  Wife is fairly insistent that these changes with memory and fatigue are getting worse over the past few months. They ride together frequently. She will have to tell him which errands they have to do one at a time rather than several at once.  Review of Systems Other than fatigue and mild memory cognitive issues unremarkable    Objective:   Physical Exam  Constitutional: He is oriented to person, place, and time. He appears well-developed and well-nourished. No distress.  HENT:  Head: Normocephalic and atraumatic.  Eyes: Conjunctivae and EOM are normal. Pupils are equal, round, and reactive to light. Right eye exhibits no discharge. Left eye exhibits no discharge. No scleral icterus.  Funduscopic exam benign  Neck: No JVD present. No thyromegaly present.  Cardiovascular: Normal rate and regular rhythm.   No murmur heard. Pulmonary/Chest: Effort normal and breath sounds normal. No respiratory distress. He has no wheezes.  Genitourinary:  Deferred  Musculoskeletal: He exhibits no edema.  Lymphadenopathy:    He has no cervical adenopathy.  Neurological: He is alert and oriented to person, place, and time. He has normal reflexes. He displays normal reflexes. No cranial nerve deficit. Coordination normal.  Skin: Skin is warm and dry. He is not diaphoretic.  Psychiatric: He has a normal mood and  affect. His behavior is normal. Judgment and thought content normal.  Vitals reviewed.  He is alert and oriented 3. He knows the Software engineer of [place]. He can remember 3 items, the color blue, telephone, [place]. after 5 minutes. He has a little bit of trouble with serial sevens but can usually get the digit on the right correct but not necessarily the digit on the left.       Assessment & Plan:  Possible memory impairment-check for vitamin deficiency. Insurance will pay for CT without contrast but not with contrast. He also had carotid Dopplers done in 2015 at [facility] which showed only mild plaque.  Fatigue-check B-12 folate, TSH, free T4. Perhaps metoprolol is contributing to fatigue but suspect he needs this beta blocker with his heart issues.  Impaired glucose tolerance-check hemoglobin A1c  History of pacemaker for Mobitz ll AV block  Essential hypertension-stable  Hyperlipidemia-doubt the very low dose of Crestor he is taking is causing memory issues  Currently is on Toviaz per urologist. Check and see if Lisbeth Ply has any CNS side effects. Suggest neuropsychological testing with Dr. Valentina Ramsey which could pinpoint early cognitive deficits. Patient and wife are in the midst of doing some financial planning and feel that they really need to know if he has some deficits. Neurological consultation. He could be tried on Aricept and/or Namenda if other workup is negative. Would prefer that he have testing with Dr. Valentina Ramsey before starting those medications. Patient and wife were told we would be in contact with them with lab results and appointment with neurologist.

## 2014-12-16 NOTE — Patient Instructions (Signed)
Lab work drawn including TSH RPR B-12 and folate levels. CT of the brain without contrast. Insurance will not cover with contrast for memory loss. Neurology appointment. Arrange for neuropsychological testing with Dr. Valentina Shaggy for possible memory impairment. Follow-up with cardiology regarding metoprolol possibly causing fatigue.

## 2014-12-17 ENCOUNTER — Telehealth: Payer: Self-pay | Admitting: *Deleted

## 2014-12-17 LAB — HEMOGLOBIN A1C
Hgb A1c MFr Bld: 5.8 % — ABNORMAL HIGH (ref <5.7)
Mean Plasma Glucose: 120 mg/dL — ABNORMAL HIGH (ref <117)

## 2014-12-17 LAB — RPR

## 2014-12-17 NOTE — Telephone Encounter (Signed)
Results reviewed with patient and wife in the office

## 2014-12-18 ENCOUNTER — Telehealth: Payer: Self-pay | Admitting: *Deleted

## 2014-12-18 DIAGNOSIS — R413 Other amnesia: Secondary | ICD-10-CM

## 2014-12-18 NOTE — Telephone Encounter (Signed)
Spoke with patient instructed him to call [facility] to schedule appt. Referral placed for Neuro consult. Patient aware to expect call to schedule appt.

## 2014-12-23 ENCOUNTER — Encounter: Payer: Self-pay | Admitting: Cardiology

## 2014-12-23 ENCOUNTER — Ambulatory Visit
Admission: RE | Admit: 2014-12-23 | Discharge: 2014-12-23 | Disposition: A | Discharge status: Home / Self Care | Payer: Medicare Other | Source: Ambulatory Visit | Attending: Internal Medicine | Admitting: Internal Medicine

## 2014-12-23 ENCOUNTER — Other Ambulatory Visit (INDEPENDENT_AMBULATORY_CARE_PROVIDER_SITE_OTHER): Payer: Medicare Other | Admitting: *Deleted

## 2014-12-23 ENCOUNTER — Telehealth: Payer: Self-pay | Admitting: *Deleted

## 2014-12-23 ENCOUNTER — Other Ambulatory Visit: Payer: Self-pay | Admitting: *Deleted

## 2014-12-23 DIAGNOSIS — I441 Atrioventricular block, second degree: Secondary | ICD-10-CM

## 2014-12-23 DIAGNOSIS — I2510 Atherosclerotic heart disease of native coronary artery without angina pectoris: Secondary | ICD-10-CM

## 2014-12-23 DIAGNOSIS — I10 Essential (primary) hypertension: Secondary | ICD-10-CM

## 2014-12-23 DIAGNOSIS — E785 Hyperlipidemia, unspecified: Secondary | ICD-10-CM

## 2014-12-23 DIAGNOSIS — R413 Other amnesia: Secondary | ICD-10-CM

## 2014-12-23 LAB — BASIC METABOLIC PANEL
BUN: 25 mg/dL — AB (ref 6–23)
CALCIUM: 9.8 mg/dL (ref 8.4–10.5)
CO2: 24 mEq/L (ref 19–32)
CREATININE: 1.07 mg/dL (ref 0.40–1.50)
Chloride: 108 mEq/L (ref 96–112)
GFR: 71.49 mL/min (ref >60.00)
Glucose, Bld: 101 mg/dL — ABNORMAL HIGH (ref 70–99)
Potassium: 4.4 mEq/L (ref 3.5–5.1)
Sodium: 137 mEq/L (ref 135–145)

## 2014-12-23 LAB — HEPATIC FUNCTION PANEL
ALT: 18 U/L (ref 0–53)
AST: 22 U/L (ref 0–37)
Albumin: 3.9 g/dL (ref 3.5–5.2)
Alkaline Phosphatase: 51 U/L (ref 39–117)
BILIRUBIN DIRECT: 0.1 mg/dL (ref 0.0–0.3)
BILIRUBIN TOTAL: 0.3 mg/dL (ref 0.2–1.2)
Total Protein: 6.5 g/dL (ref 6.0–8.3)

## 2014-12-23 LAB — LIPID PANEL
Cholesterol: 112 mg/dL (ref 0–200)
HDL: 34.3 mg/dL — AB (ref >39.00)
LDL Cholesterol: 53 mg/dL (ref 0–99)
NonHDL: 77.7
Total CHOL/HDL Ratio: 3
Triglycerides: 126 mg/dL (ref 0.0–149.0)
VLDL: 25.2 mg/dL (ref 0.0–40.0)

## 2014-12-23 NOTE — Addendum Note (Signed)
Addended by: Eulis Foster on: 12/23/2014 08:39 AM   Modules accepted: Orders

## 2014-12-23 NOTE — Telephone Encounter (Signed)
Reviewed CT results with patient and wife

## 2014-12-25 ENCOUNTER — Telehealth: Payer: Self-pay | Admitting: *Deleted

## 2014-12-25 ENCOUNTER — Encounter: Payer: Self-pay | Admitting: Internal Medicine

## 2014-12-25 ENCOUNTER — Encounter: Payer: Self-pay | Admitting: Nurse Practitioner

## 2014-12-25 ENCOUNTER — Ambulatory Visit (INDEPENDENT_AMBULATORY_CARE_PROVIDER_SITE_OTHER): Payer: Medicare Other | Admitting: Nurse Practitioner

## 2014-12-25 VITALS — BP 140/82 | HR 66 | Ht 69.0 in | Wt 190.4 lb

## 2014-12-25 DIAGNOSIS — I10 Essential (primary) hypertension: Secondary | ICD-10-CM | POA: Diagnosis not present

## 2014-12-25 DIAGNOSIS — E785 Hyperlipidemia, unspecified: Secondary | ICD-10-CM

## 2014-12-25 DIAGNOSIS — I2510 Atherosclerotic heart disease of native coronary artery without angina pectoris: Secondary | ICD-10-CM

## 2014-12-25 DIAGNOSIS — R413 Other amnesia: Secondary | ICD-10-CM

## 2014-12-25 DIAGNOSIS — Z950 Presence of cardiac pacemaker: Secondary | ICD-10-CM | POA: Diagnosis not present

## 2014-12-25 NOTE — Patient Instructions (Addendum)
We will be checking the following labs today - NONE   Medication Instructions:    Continue with your current medicines.     Testing/Procedures To Be Arranged:  N/A  Follow-Up:   6 months with Dr. Rayann Heman    Other Special Instructions:   Let me know if the neuro doctor wants a trial off of metoprolol  Call the [place] office at [phone] if you have any questions, problems or concerns.

## 2014-12-25 NOTE — Progress Notes (Signed)
CARDIOLOGY OFFICE NOTE  Date:  12/25/2014    [name] Date of Birth: 17-Aug-1940 Medical Record #[id number]  PCP:  Elby Showers, MD  Cardiologist:  [place]    Chief Complaint  Patient presents with  . Coronary Artery Disease    Follow up visit - seen for Dr. Rayann Heman    History of Present Illness: BRETT DARKO is a 74 y.o. male who presents today for a follow up visit. This is a 6 month check. Seen for Dr. Rayann Heman. He is a former patient of Dr. Susa Simmonds. He has mild CAD, HTN, HLD and pacemaker in place for Mobitz II AV block. Tolerates only low dose statin due to memory issues. He has had remote cath from 2007 showing mild diffuse CAD. He has had prior right partial nephrectomy for a renal mass by Dr. Karsten Ro back in 2012.   Last seen in Feburary by Dr. Rayann Heman - was doing ok.   Saw Dr. Renold Genta earlier this month due to worsening memory issues. Her note reviewed. Has been referred to Dr. Valentina Shaggy for neuropsychological testing.   Comes back today. Here with his wife. Says the last 6 months have "been busy". Wife notes more issues with memory and he has "finally" agreed that this is an issue. He had a vascular screen after his last visit with me - this was ok - did have some mild carotid disease. Says his heart is "great". No chest pain. Not active. Hasn't been walking. BP "jumping around". Really can't tell me what the average has been - they forgot their list. She is really concerned about his overall lack of interest. He admits he is depressed. Asking about the need for metoprolol.     Past Medical History  Diagnosis Date  . HTN (hypertension)   . Complete heart block May 2011    has PTVP in place  . Ventral hernia   . History of colonoscopy   . Memory impairment     better with lower dose statin  . PVC's (premature ventricular contractions)   . Abnormal stress test 09/15/10    SEPTAL DEFECT SECONDARY TO PREVIOUS INFARCT VS LBBB; MILD SEPTAL ISCHEMIA  . CAD (coronary  artery disease)     cath in 2007 reveals mild diffuse CAD  . Glucose intolerance (impaired glucose tolerance)     Past Surgical History  Procedure Laterality Date  . Right nephrectomy  01/2006    partial for mass  . Cataract surgery  06/2009  . Appendectomy    . Pacemaker insertion  11/18/09    MDT implanted by Dr Doreatha Lew  . Cardiac catheterization  07/29/2005    EF 50-55%; Has mild diffuse CAD  . Lbbb       Medications: Current Outpatient Prescriptions  Medication Sig Dispense Refill  . aspirin 81 MG tablet Take 81 mg by mouth daily.    Marland Kitchen econazole nitrate 1 % cream Apply 1 application topically daily as needed (infection).     . ezetimibe (ZETIA) 10 MG tablet Take 1 tablet (10 mg total) by mouth daily. 90 tablet 1  . metoprolol (LOPRESSOR) 50 MG tablet Take 1 tablet by mouth two  times daily 180 tablet 1  . Multiple Vitamin (MULTIVITAMIN) tablet Take 1 tablet by mouth daily.      Marland Kitchen omeprazole (PRILOSEC) 20 MG capsule TAKE 1 CAPSULE (20 MG TOTAL) BY MOUTH DAILY. 90 capsule 3  . rosuvastatin (CRESTOR) 5 MG tablet Take 0.5 tablets (2.5 mg total) by  mouth as directed. 45 tablet 1  . VESICARE 10 MG tablet Take 10 mg by mouth daily.      No current facility-administered medications for this visit.    Allergies: Allergies  Allergen Reactions  . Cefpodoxime Proxetil     REACTION: Colitis  . Flexeril [Cyclobenzaprine Hcl]     Possible heart side effects  . Imodium [Loperamide Hcl]     unknown  . Levofloxacin     unknown  . Nabumetone     REACTION: Heart issues/hospital admission  . Naproxen     Effects blood pressure and heart  . Nsaids     Effects blood pressure and heart  . Vantin     Pseudo colitis  . Vioxx [Rofecoxib]     Effects blood pressure and heart    Social History: The patient  reports that he quit smoking about 31 years ago. He does not have any smokeless tobacco history on file. He reports that he does not drink alcohol or use illicit drugs.   Family  History: The patient's family history includes Heart failure in his father; Leukemia in his mother.   Review of Systems: Please see the history of present illness.   Otherwise, the review of systems is positive for none.   All other systems are reviewed and negative.   Physical Exam: VS:  BP 140/82 mmHg  Pulse 66  Ht 5\' 9"  (1.753 m)  Wt 190 lb 6.4 oz (86.365 kg)  BMI 28.10 kg/m2  SpO2 100% .  BMI Body mass index is 28.1 kg/(m^2).  Wt Readings from Last 3 Encounters:  12/25/14 190 lb 6.4 oz (86.365 kg)  12/16/14 190 lb (86.183 kg)  09/05/14 191 lb (86.637 kg)    General: Pleasant. Well developed, well nourished and in no acute distress.  HEENT: Normal. Neck: Supple, no JVD, carotid bruits, or masses noted.  Cardiac: Regular rate and rhythm. No murmurs, rubs, or gallops. No edema.  Respiratory:  Lungs are clear to auscultation bilaterally with normal work of breathing.  GI: Soft and nontender.  MS: No deformity or atrophy. Gait and ROM intact. Skin: Warm and dry. Color is normal.  Neuro:  Strength and sensation are intact and no gross focal deficits noted.  Psych: Alert, appropriate and with normal affect.   LABORATORY DATA:  EKG:  EKG is not ordered today.   Lab Results  Component Value Date   WBC 6.4 12/16/2014   HGB 15.2 12/16/2014   HCT 45.1 12/16/2014   PLT 223 12/16/2014   GLUCOSE 101* 12/23/2014   CHOL 112 12/23/2014   TRIG 126.0 12/23/2014   HDL 34.30* 12/23/2014   LDLCALC 53 12/23/2014   ALT 18 12/23/2014   AST 22 12/23/2014   NA 137 12/23/2014   K 4.4 12/23/2014   CL 108 12/23/2014   CREATININE 1.07 12/23/2014   BUN 25* 12/23/2014   CO2 24 12/23/2014   TSH 2.242 12/16/2014   HGBA1C 5.8* 12/16/2014    BNP (last 3 results) No results for input(s): BNP in the last 8760 hours.  ProBNP (last 3 results) No results for input(s): PROBNP in the last 8760 hours.   Other Studies Reviewed Today:   Assessment/Plan: 1. CAD - mild/diffuse disease per  remote cath - no symptoms clinically. Really encouraged him to try and find some form of exercise - I think this would help him tremendously - much more than watching TV all day. Would consider changing metoprolol if neuro suggests this.   2. HTN -  BP is up some here today. Looks ok from Dr. Verlene Mayer office - they will continue to monitor.   3. HLD - tolerates only low dose statin therapy   4. PPM - followed by Dr. Rayann Heman  5. Past renal mass - followed by [facility]   6. Progressive memory issues - seems depressed as well. Waiting on neuro referral. We talked about changing the metoprolol - I would favor tapering - he will need additional medicine for BP control. Neither of them want to change his medicine at this time. They will let us know after visit with neuro.   Current medicines are reviewed with the patient today.  The patient does not have concerns regarding medicines other than what has been noted above.  The following changes have been made:  See above.  Labs/ tests ordered today include:   No orders of the defined types were placed in this encounter.   Disposition:   FU with Dr. Rayann Heman in 6 months.   Patient is agreeable to this plan and will call if any problems develop in the interim.   Signed: [name], RN, ANP-C 12/25/2014 11:38 AM  [facility] [address] [place], [place]  [address] Phone: [phone] Fax: [phone]

## 2014-12-25 NOTE — Telephone Encounter (Signed)
Referral placed for Dr Valentina Shaggy to see patient they will contact patient to schedule appt

## 2015-01-15 ENCOUNTER — Encounter: Payer: Self-pay | Admitting: Diagnostic Neuroimaging

## 2015-01-15 ENCOUNTER — Ambulatory Visit (INDEPENDENT_AMBULATORY_CARE_PROVIDER_SITE_OTHER): Payer: Medicare Other | Admitting: Diagnostic Neuroimaging

## 2015-01-15 VITALS — BP 153/88 | HR 62 | Ht 69.0 in | Wt 189.2 lb

## 2015-01-15 DIAGNOSIS — H9190 Unspecified hearing loss, unspecified ear: Secondary | ICD-10-CM | POA: Diagnosis not present

## 2015-01-15 DIAGNOSIS — R413 Other amnesia: Secondary | ICD-10-CM

## 2015-01-15 DIAGNOSIS — G4719 Other hypersomnia: Secondary | ICD-10-CM | POA: Insufficient documentation

## 2015-01-15 DIAGNOSIS — I2510 Atherosclerotic heart disease of native coronary artery without angina pectoris: Secondary | ICD-10-CM | POA: Diagnosis not present

## 2015-01-15 NOTE — Patient Instructions (Signed)
Follow up neuropsych testing.   Consider donepezil or memantine after additional testing.  Focus on mental, social/emotional and physical stimulation. Try learning new activities (arts, music, language, games).  Move your body often!  Eat more plants, vegetables, nuts, seeds, berries (blueberry, blackberry, raspberry).  Eat less processed, fried, salty and sugary foods.   Try to get at least 7-8+ hours sleep per day.  Caution with driving, finances, day-to-day activities.  Prepare estate planning, living will, healthcare POA documents with an attorney.

## 2015-01-15 NOTE — Progress Notes (Signed)
[facility]  PATIENT: Robert Ramsey DOB: [birth year]  REFERRING CLINICIAN: Baxley HISTORY FROM: patient and wife [name]) REASON FOR VISIT: new consult    HISTORICAL  CHIEF COMPLAINT:  Chief Complaint  Patient presents with  . Memory Loss    rm 6 , wife -[name], [place] patient, MMSE 26    HISTORY OF PRESENT ILLNESS:   74 year old right-handed male here for evaluation of memory loss. History of hypertension, hyperkalemia, coronary artery disease. Over past 2 months patient has had increasing problem with navigation while driving the car. He tends to take along with around to get from point A to point B. Even if this is a similar location. Over the past 6 months she is having to increase in the use shopping lists. Sometimes he has difficulty comprehending instructions from his wife.  2011 patient had some mild memory problems and had evaluation at that time. 2014 there is more progression. In the past 6-12 months more problems with short-term memory problems, recent conversations, saying the wrong name the streets, saying the wrong name of people including family members, word finding difficulties. Patient's wife gave an example of sending him to the store to purchase yellow cake mix and then patient having extreme difficulty finding the correct item in spite of 3 phone calls while he was at the grocery store.    REVIEW OF SYSTEMS: Full 14 system review of systems performed and notable only for fatigue snoring easy bruising easy bleeding memory loss confusion sleepiness depression decreased energy disinterest in activities.  ALLERGIES: Allergies  Allergen Reactions  . Cefpodoxime Proxetil     REACTION: Colitis  . Flexeril [Cyclobenzaprine Hcl]     Possible heart side effects  . Imodium [Loperamide Hcl]     unknown  . Levofloxacin     unknown  . Nabumetone     REACTION: Heart issues/hospital admission  . Naproxen     Effects blood pressure and heart  .  Nsaids     Effects blood pressure and heart  . Vantin     Pseudo colitis  . Vioxx [Rofecoxib]     Effects blood pressure and heart    HOME MEDICATIONS: Outpatient Prescriptions Prior to Visit  Medication Sig Dispense Refill  . aspirin 81 MG tablet Take 81 mg by mouth daily.    Marland Kitchen econazole nitrate 1 % cream Apply 1 application topically daily as needed (infection).     . ezetimibe (ZETIA) 10 MG tablet Take 1 tablet (10 mg total) by mouth daily. 90 tablet 1  . metoprolol (LOPRESSOR) 50 MG tablet Take 1 tablet by mouth two  times daily 180 tablet 1  . Multiple Vitamin (MULTIVITAMIN) tablet Take 1 tablet by mouth daily.      Marland Kitchen omeprazole (PRILOSEC) 20 MG capsule TAKE 1 CAPSULE (20 MG TOTAL) BY MOUTH DAILY. 90 capsule 3  . VESICARE 10 MG tablet Take 10 mg by mouth daily.     . rosuvastatin (CRESTOR) 5 MG tablet Take 0.5 tablets (2.5 mg total) by mouth as directed. 45 tablet 1   No facility-administered medications prior to visit.    PAST MEDICAL HISTORY: Past Medical History  Diagnosis Date  . HTN (hypertension)   . Complete heart block May 2011    has PTVP in place  . Ventral hernia   . History of colonoscopy   . Memory impairment     better with lower dose statin  . PVC's (premature ventricular contractions)   . Abnormal stress test 09/15/10  SEPTAL DEFECT SECONDARY TO PREVIOUS INFARCT VS LBBB; MILD SEPTAL ISCHEMIA  . CAD (coronary artery disease)     cath in 2007 reveals mild diffuse CAD  . Glucose intolerance (impaired glucose tolerance)     PAST SURGICAL HISTORY: Past Surgical History  Procedure Laterality Date  . Right nephrectomy  01/2006    partial for mass  . Cataract surgery  06/2009, 10/2014  . Appendectomy    . Pacemaker insertion  11/18/09    MDT implanted by Dr Doreatha Lew  . Cardiac catheterization  07/29/2005    EF 50-55%; Has mild diffuse CAD  . Lbbb      FAMILY HISTORY: Family History  Problem Relation Age of Onset  . Heart failure Father   . Leukemia  Mother     SOCIAL HISTORY:  History   Social History  . Marital Status: Married    Spouse Name: [name]  . Number of Children: 4  . Years of Education: 12   Occupational History  . Retired     [employer]   Social History Main Topics  . Smoking status: Former Smoker    Quit date: 07/06/1983  . Smokeless tobacco: Not on file     Comment: stopped smoking in 1985  . Alcohol Use: No  . Drug Use: No  . Sexual Activity: Not Currently   Other Topics Concern  . Not on file   Social History Narrative   Retired from [employer], quantity.   Lives at home with wife   Caffeine use- morning coffee     PHYSICAL EXAM  GENERAL EXAM/CONSTITUTIONAL: Vitals:  Filed Vitals:   01/15/15 1405  BP: 153/88  Pulse: 62  Height: 5\' 9"  (1.753 m)  Weight: 189 lb 3.2 oz (85.821 kg)     Body mass index is 27.93 kg/(m^2).  Visual Acuity Screening   Right eye Left eye Both eyes  Without correction:     With correction: 20/100 20/70   Comments: New glasses, cataract surgery April 2016    Patient is in no distress; well developed, nourished and groomed; neck is supple; MASKED FACIES  CARDIOVASCULAR:  Examination of carotid arteries is normal; no carotid bruits  Regular rate and rhythm, no murmurs  Examination of peripheral vascular system by observation and palpation is normal  EYES:  Ophthalmoscopic exam of optic discs and posterior segments is normal; no papilledema or hemorrhages  MUSCULOSKELETAL:  Gait, strength, tone, movements noted in Neurologic exam below  NEUROLOGIC: MENTAL STATUS:  MMSE - Mini Mental State Exam 01/15/2015  Orientation to time 5  Orientation to Place 5  Registration 3  Attention/ Calculation 5  Recall 1  Language- name 2 objects 2  Language- repeat 1  Language- follow 3 step command 3  Language- read & follow direction 1  Write a sentence 1  Copy design 1  Total score 28    awake, alert, oriented to person, place and time  recent and  remote memory intact; EXCEPT 1/3 RECALL.  normal attention and concentration  language fluent, comprehension intact, naming intact,   fund of knowledge appropriate  CRANIAL NERVE:   2nd - no papilledema on fundoscopic exam  2nd, 3rd, 4th, 6th - pupils equal and reactive to light, visual fields full to confrontation, extraocular muscles intact, no nystagmus  5th - facial sensation symmetric  7th - facial strength symmetric  8th - hearing intact  9th - palate elevates symmetrically, uvula midline  11th - shoulder shrug symmetric  12th - tongue protrusion midline  MOTOR:   normal bulk and tone, BRADYKINESIA IN LUE > RUE; full strength in the BUE, BLE  SENSORY:   normal and symmetric to light touch, pinprick, temperature, vibration; DECR IN RIGHT FOOT  COORDINATION:   finger-nose-finger, fine finger movements, heel-shin normal  REFLEXES:   deep tendon reflexes present and symmetric; NO FRONTAL RELEASE SIGNS  GAIT/STATION:   narrow based gait; romberg is negative    DIAGNOSTIC DATA (LABS, IMAGING, TESTING) - I reviewed patient records, labs, notes, testing and imaging myself where available.  Lab Results  Component Value Date   WBC 6.4 12/16/2014   HGB 15.2 12/16/2014   HCT 45.1 12/16/2014   MCV 95.1 12/16/2014   PLT 223 12/16/2014      Component Value Date/Time   NA 137 12/23/2014 0840   K 4.4 12/23/2014 0840   CL 108 12/23/2014 0840   CO2 24 12/23/2014 0840   GLUCOSE 101* 12/23/2014 0840   BUN 25* 12/23/2014 0840   CREATININE 1.07 12/23/2014 0840   CREATININE 0.96 12/16/2014 1252   CALCIUM 9.8 12/23/2014 0840   PROT 6.5 12/23/2014 0840   ALBUMIN 3.9 12/23/2014 0840   AST 22 12/23/2014 0840   ALT 18 12/23/2014 0840   ALKPHOS 51 12/23/2014 0840   BILITOT 0.3 12/23/2014 0840   Lab Results  Component Value Date   CHOL 112 12/23/2014   HDL 34.30* 12/23/2014   LDLCALC 53 12/23/2014   TRIG 126.0 12/23/2014   CHOLHDL 3 12/23/2014   Lab Results   Component Value Date   HGBA1C 5.8* 12/16/2014   Lab Results  Component Value Date   VITAMINB12 947* 12/16/2014   Lab Results  Component Value Date   TSH 2.242 12/16/2014    12/23/14 CT head [I reviewed images myself and agree with interpretation. -VRP]  - Atrophy. No acute findings.     ASSESSMENT AND PLAN  74 y.o. year old male here with progressive short-term memory problems, navigation difficulty, confusion, over past 6-12 months. Mild symptoms started in 2011.  Ddx: neurodegenerative dementia vs pseudo-dementia (sleep apnea, depression) vs hearing loss  Excessive daytime sleepiness - Plan: Ambulatory referral to Sleep Studies, Ambulatory referral to Audiology  Memory loss - Plan: Ambulatory referral to Sleep Studies, Ambulatory referral to Audiology  Hearing loss, unspecified laterality - Plan: Ambulatory referral to Audiology    PLAN:  Additional testing as below.  Follow up neuropsych testing.   Consider donepezil or memantine after additional testing.  Focus on mental, social/emotional and physical stimulation. Try learning new activities (arts, music, language, games).  Move your body often!  Eat more plants, vegetables, nuts, seeds, berries (blueberry, blackberry, raspberry).  Eat less processed, fried, salty and sugary foods.   Try to get at least 7-8+ hours sleep per day.  Caution with driving, finances, day-to-day activities.  Prepare estate planning, living will, healthcare POA documents with an attorney.    Orders Placed This Encounter  Procedures  . Ambulatory referral to Sleep Studies  . Ambulatory referral to Audiology   Return in about 3 months (around 04/17/2015).    Penni Bombard, MD 3/76/2831, 5:17 PM Certified in Neurology, Neurophysiology and Neuroimaging  [facility] [address], [place], [place] [address] [phone]

## 2015-01-22 DIAGNOSIS — N401 Enlarged prostate with lower urinary tract symptoms: Secondary | ICD-10-CM | POA: Diagnosis not present

## 2015-01-22 DIAGNOSIS — Z125 Encounter for screening for malignant neoplasm of prostate: Secondary | ICD-10-CM | POA: Diagnosis not present

## 2015-01-29 DIAGNOSIS — N402 Nodular prostate without lower urinary tract symptoms: Secondary | ICD-10-CM | POA: Diagnosis not present

## 2015-01-29 DIAGNOSIS — R350 Frequency of micturition: Secondary | ICD-10-CM | POA: Diagnosis not present

## 2015-01-29 DIAGNOSIS — N138 Other obstructive and reflux uropathy: Secondary | ICD-10-CM | POA: Diagnosis not present

## 2015-01-29 DIAGNOSIS — N401 Enlarged prostate with lower urinary tract symptoms: Secondary | ICD-10-CM | POA: Diagnosis not present

## 2015-02-07 DIAGNOSIS — K210 Gastro-esophageal reflux disease with esophagitis: Secondary | ICD-10-CM | POA: Diagnosis not present

## 2015-02-17 ENCOUNTER — Other Ambulatory Visit: Payer: Self-pay | Admitting: Internal Medicine

## 2015-02-19 ENCOUNTER — Encounter: Payer: Self-pay | Admitting: Neurology

## 2015-02-19 ENCOUNTER — Ambulatory Visit (INDEPENDENT_AMBULATORY_CARE_PROVIDER_SITE_OTHER): Payer: Medicare Other | Admitting: Neurology

## 2015-02-19 VITALS — BP 124/62 | HR 62 | Resp 16 | Ht 69.0 in | Wt 190.0 lb

## 2015-02-19 DIAGNOSIS — R413 Other amnesia: Secondary | ICD-10-CM

## 2015-02-19 DIAGNOSIS — G4719 Other hypersomnia: Secondary | ICD-10-CM | POA: Diagnosis not present

## 2015-02-19 DIAGNOSIS — I2510 Atherosclerotic heart disease of native coronary artery without angina pectoris: Secondary | ICD-10-CM | POA: Diagnosis not present

## 2015-02-19 DIAGNOSIS — G4733 Obstructive sleep apnea (adult) (pediatric): Secondary | ICD-10-CM | POA: Diagnosis not present

## 2015-02-19 NOTE — Progress Notes (Signed)
Subjective:    Patient ID: Robert Ramsey is a 74 y.o. male.  HPI     Star Age, MD, PhD [facility] [address], [address] [place], [place] [address]  Dear Bonnita Levan,   I saw your patient, Robert Ramsey, upon your kind request in my clinic today for initial consultation of his sleep disorder, in particular, concern for underlying obstructive sleep apnea. The patient is accompanied by his wife today. As you know, Robert Ramsey is a very pleasant 74 year old right-handed gentleman with an underlying medical history of hypertension, complete heart block, hyperlipidemia, PVCs, coronary artery disease, impaired glucose tolerance, reflux disease, overweight state, memory loss, status post cataract surgery, appendectomy, pacemaker placement and right nephrectomy, who reports snoring and excessive daytime somnolence. I reviewed your office note from 01/15/2015. He noticed some hearing loss and referred him for audiometry.  His snoring has improved a little, worse on his back, per wife. He denies morning headaches and has occasional nocturia. He denies RLS symptoms and does not tend to kicks his legs in his sleep. Bedtime is around MN and falling asleep is usually not a problem. He does watch TV prior to going to bed but typically not while in bed. He had sleep study testing in 1995 which per wife showed mild obstructive sleep apnea but not enough to use CPAP. CPAP was discussed at the time however. He is scheduled for neuropsychological evaluation in September. He has hearing testing pending for later this month and a follow-up with you in October. He reports mild snoring. Very occasionally has woken himself up with a sense of gasping or snorting. His wife has noticed occasional snorting sounds. He denies any overt family history of obstructive sleep apnea. He used to smoke a pipe but quit in 1987. He drinks coffee, 2 cups each morning and occasional tea for lunch. He does  not drink any alcohol. He had 3 brothers. One is alive , 1 died from a heart attack and the other brother died after a truck accident. His main complaint is lack of energy during the day. Rise time is around 8 or 8:30. He does not always wake up rested and used to take extended naps but his cardiologist advised him to be more active during the day and less sedentary. He does like to nap. His Epworth sleepiness score is 8 out of 24 today, his fatigue scores 51 out of 63.   Her Past Medical History Is Significant For: Past Medical History  Diagnosis Date  . HTN (hypertension)   . Complete heart block May 2011    has PTVP in place  . Ventral hernia   . History of colonoscopy   . Memory impairment     better with lower dose statin  . PVC's (premature ventricular contractions)   . Abnormal stress test 09/15/10    SEPTAL DEFECT SECONDARY TO PREVIOUS INFARCT VS LBBB; MILD SEPTAL ISCHEMIA  . CAD (coronary artery disease)     cath in 2007 reveals mild diffuse CAD  . Glucose intolerance (impaired glucose tolerance)   . Pacemaker     His Past Surgical History Is Significant For: Past Surgical History  Procedure Laterality Date  . Right nephrectomy  01/2006    partial for mass  . Cataract surgery  06/2009, 10/2014  . Appendectomy    . Pacemaker insertion  11/18/09    MDT implanted by Dr Doreatha Lew  . Cardiac catheterization  07/29/2005    EF 50-55%; Has mild  diffuse CAD  . Lbbb      His Family History Is Significant For: Family History  Problem Relation Age of Onset  . Heart failure Father   . Leukemia Mother     His Social History Is Significant For: Social History   Social History  . Marital Status: Married    Spouse Name: [name]  . Number of Children: 4  . Years of Education: 12   Occupational History  . Retired     [employer]   Social History Main Topics  . Smoking status: Former Smoker    Quit date: 07/06/1983  . Smokeless tobacco: None     Comment: stopped smoking in 1985   . Alcohol Use: No  . Drug Use: No  . Sexual Activity: Not Currently   Other Topics Concern  . None   Social History Narrative   Retired from [employer], [employer].   Lives at home with wife   Caffeine use- morning coffee and 2 cup/ 1 cup of tea at lunch     His Allergies Are:  Allergies  Allergen Reactions  . Cefpodoxime Proxetil     REACTION: Colitis  . Cinnamon   . Flexeril [Cyclobenzaprine Hcl]     Possible heart side effects  . Imodium [Loperamide Hcl]     unknown  . Levofloxacin     unknown  . Nabumetone     REACTION: Heart issues/hospital admission  . Naproxen     Effects blood pressure and heart  . Nsaids     Effects blood pressure and heart  . Vantin     Pseudo colitis  . Vioxx [Rofecoxib]     Effects blood pressure and heart  :   His Current Medications Are:  Outpatient Encounter Prescriptions as of 02/19/2015  Medication Sig  . aspirin 81 MG tablet Take 81 mg by mouth daily.  . metoprolol (LOPRESSOR) 50 MG tablet Take 1 tablet by mouth two  times daily  . Multiple Vitamin (MULTIVITAMIN) tablet Take 1 tablet by mouth daily.    . ranitidine (ZANTAC) 150 MG tablet   . rosuvastatin (CRESTOR) 5 MG tablet Take 0.5 tablets (2.5 mg total) by mouth as directed.  . VESICARE 10 MG tablet Take 10 mg by mouth daily.   Marland Kitchen ZETIA 10 MG tablet Take 1 tablet by mouth  daily  . [DISCONTINUED] econazole nitrate 1 % cream Apply 1 application topically daily as needed (infection).   . [DISCONTINUED] omeprazole (PRILOSEC) 20 MG capsule TAKE 1 CAPSULE (20 MG TOTAL) BY MOUTH DAILY.   No facility-administered encounter medications on file as of 02/19/2015.  :  Review of Systems:  Out of a complete 14 point review of systems, all are reviewed and negative with the exception of these symptoms as listed below:   Review of Systems  Cardiovascular: Positive for leg swelling.  Genitourinary: Positive for dysuria.       Impotence  Allergic/Immunologic: Positive for environmental  allergies.  Neurological:       Memory loss, No trouble falling or staying asleep, snoring, wakes up in the morning feeling rested, daytime tiredness, falls asleep when sitting still, takes naps.   Hematological: Bruises/bleeds easily.  Psychiatric/Behavioral: Positive for confusion.       Decreased energy, depression     Objective:  Neurologic Exam  Physical Exam Physical Examination:   Filed Vitals:   02/19/15 1048  BP: 124/62  Pulse: 62  Resp: 16    General Examination: The patient is a very pleasant  74 y.o. male in no acute distress. He appears well-developed and well-nourished and well groomed.   HEENT: Normocephalic, atraumatic, pupils are equal, round and reactive to light and accommodation. Funduscopic exam is normal with sharp disc margins noted. Extraocular tracking is good without limitation to gaze excursion or nystagmus noted. Normal smooth pursuit is noted. Hearing is grossly intact. Tympanic membranes are clear bilaterally. Face is symmetric with normal facial animation and normal facial sensation. Speech is clear with no dysarthria noted. There is no hypophonia. There is no lip, neck/head, jaw or voice tremor. Neck is supple with full range of passive and active motion. There are no carotid bruits on auscultation. Oropharynx exam reveals: mild mouth dryness, adequate dental hygiene and mild airway crowding, due to redundant soft palate and thicker tongue. Mallampati is class II. Tongue protrudes centrally and palate elevates symmetrically. Tonsils are small in size. Neck size is 15.5 inches. He has a Mild overbite. Nasal inspection reveals no significant nasal mucosal bogginess or redness and no septal deviation.   Chest: Clear to auscultation without wheezing, rhonchi or crackles noted.  Heart: S1+S2+0, regular and normal without murmurs, rubs or gallops noted.   Abdomen: Soft, non-tender and non-distended with normal bowel sounds appreciated on  auscultation.  Extremities: There is trace pitting edema in the distal lower extremities bilaterally. He has a compression stocking on the right, up to knee. Pedal pulses are intact.  Skin: Warm and dry without trophic changes noted. There are no varicose veins.  Musculoskeletal: exam reveals no obvious joint deformities, tenderness or joint swelling or erythema.   Neurologically:  Mental status: The patient is awake, alert and oriented in all 4 spheres. His immediate and remote memory, attention, language skills and fund of knowledge are fairly appropriate. There is no evidence of aphasia, agnosia, apraxia or anomia. Speech is clear with normal prosody and enunciation. Thought process is linear. Mood is normal and affect is normal.  Cranial nerves II - XII are as described above under HEENT exam. In addition: shoulder shrug is normal with equal shoulder height noted. Motor exam: Normal bulk, strength and tone is noted. There is no drift, tremor or rebound. Romberg is negative. Reflexes are 2+ throughout. Fine motor skills and coordination: intact with normal finger taps, normal hand movements, normal rapid alternating patting, normal foot taps and normal foot agility.  Cerebellar testing: No dysmetria or intention tremor on finger to nose testing. Heel to shin is slightly difficult on the right. There is no truncal or gait ataxia.  Sensory exam: intact to light touch in the upper and lower extremities.  Gait, station and balance: He stands easily. No veering to one side is noted. No leaning to one side is noted. Posture is age-appropriate and stance is narrow based. Gait shows normal stride length and normal pace. No problems turning are noted. He turns en bloc. Tandem walk is slightly difficult for him.               Assessment and Plan:   In summary, Robert Ramsey is a very pleasant 74 y.o.-year old male with an underlying medical history of hypertension, complete heart block, hyperlipidemia,  PVCs, coronary artery disease, impaired glucose tolerance, reflux disease, overweight state, memory loss, status post cataract surgery, appendectomy, pacemaker placement and right nephrectomy, whose history and physical exam are indeed concerning for obstructive sleep apnea (OSA), and he was previously diagnosed with obstructive sleep apnea, mild at the time, as I understand. I had a long chat with  the patient and his wife about my findings and the diagnosis of OSA, its prognosis and treatment options. We talked about medical treatments, surgical interventions and non-pharmacological approaches. I explained in particular the risks and ramifications of untreated moderate to severe OSA, especially with respect to developing cardiovascular disease down the Road, including congestive heart failure, difficult to treat hypertension, cardiac arrhythmias, or stroke. Even type 2 diabetes has, in part, been linked to untreated OSA. Symptoms of untreated OSA include daytime sleepiness, memory problems, mood irritability and mood disorder such as depression and anxiety, lack of energy, as well as recurrent headaches, especially morning headaches. We talked about trying to maintain a healthy lifestyle in general, as well as the importance of weight control. I encouraged the patient to eat healthy, exercise daily and keep well hydrated, to keep a scheduled bedtime and wake time routine, to not skip any meals and eat healthy snacks in between meals. I advised the patient not to drive when feeling sleepy. I recommended the following at this time: sleep study with potential positive airway pressure titration. (We will score hypopneas at 4% and split the sleep study into diagnostic and treatment portion, if the estimated. 2 hour AHI is >15/h).   I explained the sleep test procedure to the patient and also outlined possible surgical and non-surgical treatment options of OSA, including the use of a custom-made dental device (which  would require a referral to a specialist dentist or oral surgeon), upper airway surgical options, such as pillar implants, radiofrequency surgery, tongue base surgery, and UPPP (which would involve a referral to an ENT surgeon). Rarely, jaw surgery such as mandibular advancement may be considered.  I also explained the CPAP treatment option to the patient, who indicated that he would be willing to try CPAP if the need arises. I explained the importance of being compliant with PAP treatment, not only for insurance purposes but primarily to improve His symptoms, and for the patient's long term health benefit, including to reduce His cardiovascular risks. I answered all their questions today and the patient and his wife were in agreement. I would like to see him back after the sleep study is completed and encouraged them to call with any interim questions, concerns, problems or updates.   Thank you very much for allowing me to participate in the care of this nice patient. If I can be of any further assistance to you please do not hesitate to talk to me.  Sincerely,   Star Age, MD, PhD

## 2015-02-19 NOTE — Patient Instructions (Signed)

## 2015-03-03 ENCOUNTER — Ambulatory Visit: Payer: Medicare Other | Attending: Audiology | Admitting: Audiology

## 2015-03-03 DIAGNOSIS — H9193 Unspecified hearing loss, bilateral: Secondary | ICD-10-CM | POA: Diagnosis not present

## 2015-03-03 DIAGNOSIS — Z01118 Encounter for examination of ears and hearing with other abnormal findings: Secondary | ICD-10-CM | POA: Insufficient documentation

## 2015-03-03 DIAGNOSIS — H93293 Other abnormal auditory perceptions, bilateral: Secondary | ICD-10-CM

## 2015-03-03 DIAGNOSIS — R94128 Abnormal results of other function studies of ear and other special senses: Secondary | ICD-10-CM | POA: Diagnosis not present

## 2015-03-03 NOTE — Procedures (Signed)
Outpatient Rehabilitation and [facility] [address] [place], [place] [address] [place] EVALUATION Name: JEREMAIH KLIMA DOB:  [birth year] MRN:  [id number]                                Diagnosis: Memory Loss, R/O hearing loss Date: 03/03/2015     Referent: Elby Showers, MD PCP                                                                                 Dr. Leta Baptist, neurologist  HISTORY: Robert Ramsey, age 74 y.o. years, was seen for an audiological evaluation.  He states that his primary concern is "dementia" but that his neurologist wanted to rule out hearing loss. Robert Ramsey states that he will be having "a neuro-psychological evaluation soon".  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   No significant hearing loss is noticed and AKITO BOOMHOWER denies tinnitus, fullness in the ears or dizziness or balance issues.  Robert Ramsey had "a right partial nephrectomy in 2007, pacemaker 2011, and cataract surguery in 2010 and 2016.           EVALUATION: Pure tone air and tone conduction was completed using conventional audiometry with inserts. Hearing thresholds are symmetrical and are 10-20 dBHL from 250Hz  - 6000Hz  and 30 dBHL at 8000Hz  bilaterally. Speech reception thresholds are 15dBHL in the right ear and 10 dBHL in the left ear using recorded spondee words.  The reliability is good. Word recognition is 100% at 50dBHL in the right and 94% at 5-dBHL in the left using recorded NU-6 word lists in quiet. In minimal background noise with +5dB signal to noise ratio word recogntion drops to  70% in the right ear and 60% in the left ear. Otoscopic inspection reveals clear ear canals with visible tympanic membranes.   Tympanometry showed normal middle ear pressure,  volume and compliance bilaterally (Type A).  Ipsilateral and contralateral acoustic reflexes were bilaterally at 90dB. Distortion Product Otoacoustic Emission (DPOAE) testing from 2000Hz  - 10,000Hz  show mixed results bilaterally that are present in the low frequencies with weak/absent/abnormal outer hair cell function in the cochlea in the high frequencies - poorer on the right side.   Central Auditory Processing Screening was completed to help provide additional information for the neuropsychologist assessment.   Competing Sentences (CS) involved a different sentences being presented to each ear at different volumes. The instructions are to repeat the softer volume sentences. Posterior temporal issues will show poorer performance in the ear contralateral to the lobe involved.  Derrell scored 100% in the right ear and 95% in the left ear.  The test results are slightly abnormal on the left side - instead of repeating "This is a long freight train" he said "It was a long freight train".  Dichotic Digits (DD) presents different two digits to each ear. All  four digits are to be repeated. Poor performance suggests that cerebellar and/or brainstem may be involved. Lynford scored 85% in the right ear and 87.5% in the left ear. The test results indicate that [name] scored slightly abnormal with a cut off of 90% in each ear.   CONCLUSION:      MAVERICK DIEUDONNE has normal hearing throughout most of the speech range with a borderline mild hearing loss at 8000Hz  only bilaterally. Please note that the inner ear function shows significant high frequency damage to the outer hair cells bilaterally - slightly poorer on the right side.  Monitoring of his hearing is needed to rule out a progressive hearing loss.  Robert Ramsey has normal middle ear pressure and acoustic reflexes in each ear.   Robert Ramsey has excellent word recognition in quiet that drops in minimal background noise to borderline fair/poor on the right and  poor on the left. Since both he and his wife reported improved word understanding when KYROLLOS CORDELL is able to watch the speakers face and limit the number of tasks occuring simultaneously, auditory processing tests were used to screen. Robert Ramsey was slightly abnormal on each of them but he found the one with two numbers coming in each ear at once (Dichotic Digits) much more stressful.   These slight abnormal findings on today's audiological evaluation do not explain the degree of difficulty that Robert Ramsey is experiencing with communication and understanding.     RECOMMENDATIONS: 1.  Continue with plans for the neuro-psychological evaluation. 2.   Monitor hearing closely with a repeat audiological evaluation in 6 months (earlier if there is any change in hearing). 3.  Strategies that help improve hearing include: A) Face the speaker directly. Optimal is having the speakers face well - lit.  Unless amplified, being within 3-6 feet of the speaker will enhance word recognition. B) Avoid having the speaker back-lit as this will minimize the ability to use cues from lip-reading, facial expression and gestures. C)  Word recognition is poorer in background noise. For optimal word recognition, turn off the TV, radio or noisy fan when engaging in conversation. In a restaurant, try to sit away from noise sources and close to the primary speaker.  D)  Ask for topic clarification from time to time in order to remain in the conversation.  Most people don't mind repeating or clarifying a point when asked.    Deborah L. Heide Spark, Au.D., CCC-A Doctor of Audiology 03/03/2015

## 2015-03-04 ENCOUNTER — Ambulatory Visit (INDEPENDENT_AMBULATORY_CARE_PROVIDER_SITE_OTHER): Payer: Medicare Other | Admitting: *Deleted

## 2015-03-04 ENCOUNTER — Ambulatory Visit (INDEPENDENT_AMBULATORY_CARE_PROVIDER_SITE_OTHER): Payer: Medicare Other | Admitting: Neurology

## 2015-03-04 DIAGNOSIS — G4733 Obstructive sleep apnea (adult) (pediatric): Secondary | ICD-10-CM | POA: Diagnosis not present

## 2015-03-04 DIAGNOSIS — G4720 Circadian rhythm sleep disorder, unspecified type: Secondary | ICD-10-CM

## 2015-03-04 DIAGNOSIS — I442 Atrioventricular block, complete: Secondary | ICD-10-CM

## 2015-03-04 DIAGNOSIS — R9431 Abnormal electrocardiogram [ECG] [EKG]: Secondary | ICD-10-CM

## 2015-03-04 DIAGNOSIS — G479 Sleep disorder, unspecified: Secondary | ICD-10-CM

## 2015-03-04 NOTE — Progress Notes (Signed)
Remote pacemaker transmission.   

## 2015-03-04 NOTE — Sleep Study (Signed)
Please see the scanned sleep study interpretation located in the Procedure tab within the Chart Review section. 

## 2015-03-07 ENCOUNTER — Telehealth: Payer: Self-pay | Admitting: Neurology

## 2015-03-07 DIAGNOSIS — G4733 Obstructive sleep apnea (adult) (pediatric): Secondary | ICD-10-CM

## 2015-03-07 LAB — CUP PACEART REMOTE DEVICE CHECK
Battery Impedance: 201 Ohm
Battery Remaining Longevity: 119 mo
Battery Voltage: 2.79 V
Brady Statistic AP VS Percent: 0 %
Brady Statistic AS VP Percent: 75 %
Date Time Interrogation Session: 20160830110722
Lead Channel Impedance Value: 674 Ohm
Lead Channel Pacing Threshold Pulse Width: 0.4 ms
Lead Channel Pacing Threshold Pulse Width: 0.4 ms
Lead Channel Sensing Intrinsic Amplitude: 2.8 mV
Lead Channel Setting Pacing Amplitude: 2.5 V
Lead Channel Setting Sensing Sensitivity: 2 mV
MDC IDC MSMT LEADCHNL RA PACING THRESHOLD AMPLITUDE: 0.375 V
MDC IDC MSMT LEADCHNL RV IMPEDANCE VALUE: 718 Ohm
MDC IDC MSMT LEADCHNL RV PACING THRESHOLD AMPLITUDE: 0.5 V
MDC IDC SET LEADCHNL RA PACING AMPLITUDE: 2 V
MDC IDC SET LEADCHNL RV PACING PULSEWIDTH: 0.4 ms
MDC IDC STAT BRADY AP VP PERCENT: 25 %
MDC IDC STAT BRADY AS VS PERCENT: 0 %

## 2015-03-07 NOTE — Telephone Encounter (Signed)
Dr. Gladstone Lighter patient, seen by me on 02/19/15, PSG on 03/04/15:  Please call and notify the patient that the recent sleep study did confirm the diagnosis of obstructive sleep apnea and that I recommend treatment for this in the form of CPAP, especially in light of his cardiac history and memory complaints. This will require a repeat sleep study for proper titration and mask fitting. Please explain to patient and arrange for a CPAP titration study (you can route to [facility]). I have placed an order in the chart. Thanks, Star Age, MD, PhD [facility])

## 2015-03-12 NOTE — Telephone Encounter (Signed)
I spoke to patient and his wife. They are aware of results and recommendation. He is already scheduled for a sleep study per patient.

## 2015-03-12 NOTE — Telephone Encounter (Signed)
Left message to call back  

## 2015-03-13 ENCOUNTER — Ambulatory Visit: Payer: Medicare Other | Attending: Psychology | Admitting: Psychology

## 2015-03-13 DIAGNOSIS — G3184 Mild cognitive impairment, so stated: Secondary | ICD-10-CM | POA: Diagnosis not present

## 2015-03-13 NOTE — Progress Notes (Signed)
[facility]  [address]   Telephone [phone] [address] Fax [phone] [place], [place] [address]  Initial Contact Note  Name:  Robert Ramsey Date of Birth; [birth year] MRN:  [id number] Date:  03/13/2015  Robert Ramsey is an 74 y.o. male who was referred for neuropsychological evaluation by Emeline General, MD due to a five year history of memory decline.   A total of 4 hours was spent today reviewing medical records, interviewing (CPT [phone]) [name] and his wife, and administering and scoring neurocognitive tests (CPT [phone] & [phone]).  Preliminary Diagnostic Impression: Mild Cognitive Impairment, amnestic type [G31.84]  There were no concerns expressed or behaviors displayed by [name] that would require immediate attention.   A full report will follow once the planned testing has been completed. His next appointment is scheduled for 03/13/15.    Jamey Ripa, Ph.D Licensed Psychologist 03/13/2015

## 2015-03-14 ENCOUNTER — Ambulatory Visit (INDEPENDENT_AMBULATORY_CARE_PROVIDER_SITE_OTHER): Payer: Medicare Other | Admitting: Psychology

## 2015-03-14 DIAGNOSIS — G3184 Mild cognitive impairment, so stated: Secondary | ICD-10-CM

## 2015-03-14 NOTE — Progress Notes (Addendum)
[facility]  [address]   Telephone [phone] [address] Fax [phone] Robert Ramsey, Robert [place] [address]   Robert Ramsey* This report should not be released without the consent of the client  Name:   Robert Ramsey Date of Birth:  11/16/2040 [facility] MR#:  [id number] Dates of Evaluation: 03/13/15 & 03/14/15  Reason for Referral Robert Ramsey is a 74 year-old, right-handed man who according to his wife began to exhibit mild memory difficulties in 2011 that have become more pronounced within the past two years. He underwent a brain MRI scan on 11/14/09 that showed mild age related atrophy without evidence of focal or acute abnormality. His dosage of his statin medication was decreased in 2011 without subsequent change in memory. More recent medical work-up included a CT of the brain on 12/23/14 that showed atrophy without acute findings, a sleep study on 03/04/15 that diagnosed obstructive sleep apnea (titration and CPAP device fitting pending) and an audiological evaluation on 03/03/15 that indicated normal hearing throughout most of the speech range except for borderline mild hearing loss at very high frequencies bilaterally. He was referred for neuropsychological evaluation by his primary care provider, Robert General, MD.    Sources of Information Electronic medical records from the [facility] were reviewed. Robert Ramsey and his wife, Robert Ramsey, were interviewed.    Chief Complaints His wife reported that he began to exhibit mild memory difficulties in 2011 that have become more pronounced within the past two years. Their daughter and grandson have also reportedly noticed his memory difficulties. She was not aware of any changes in his mood or social situation coincident with the onset of his memory problems. She offered recent examples of him taking an indirect route while driving though eventually finding his  destination, needing information to be repeated to ensure his comprehension, being unable to remember to buy three items at the supermarket, having trouble recalling the names of his son-in-laws and decreased ability to keep track of a conversation when the topic changes. He has not appeared inattentive or distractible. With regards to his mood and behavior, she reported that he had displayed lowered energy level and decreased interest in life over a two year period beginning in 2014 though he did not appear sad nor express themes of depression. He immediately began to be more active and engaged after he was urged to do so by his cardiology physician assistant in May 2016. He has not exhibited any changes in his habits or personality within the past few years. She stated that he has continued to perform his basic and instrumental activities of daily living independently.  [name] reported that he has "occasionally" had problems recalling names or finding words. He did cite any problems with gait, balance, pain, vison, hearing, speech, swallowing, appetite or sleep (his wife stated that he snores less now than in the past). With regards to mood, he did not report experiencing any persisting emotional distress though of late he has felt "aggravated" when his wife confronts him with an apparent memory error. He denied experiencing sad mood, apathy, loss of interests, mood instability, anxiety, suicidal thoughts, hallucinations or delusions. He did not cite any ongoing life stressors.  Background His past medical history was remarkable for coronary artery disease (described as mild and diffuse), hyperlipidemia, hypertension and premature ventricular contractions. He had a pacemaker placed in 2011 due to bradycardia, a right kidney oncocytoma removed in 2007 and cataract surgery in 2010.  He denied history of developmental delays, head injury, seizure activity, stroke-like symptoms, neurological infection or  exposure to neurotoxic substances. He reported occasional use of alcohol and no illicit drug use. He quit pipe smoking in 1985.  His current medications include aspirin, metoprolol, ranitidine, rosuvastatin, Vesicare and Zetia.   He reported no history of serious emotional difficulties, use of psychiatric medication or mental health contacts.  He has no known family history of memory disorder or dementia. He stated that his paternal grandmother was admitted to mental health facility for unknown reasons. He reported that his daughter has been diagnosed with Bipolar disorder and has exhibited memory difficulties since early adulthood.   He lives with his wife of 78 years. He has previously been divorced. He has two adult daughters and two stepdaughters.    He retired in 2004. 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   He reported that he was graduated from high school as an Film/video editor without history of school-based attentional or learning problems. He received training in Robert Ramsey while he was in the [employer].  Observations He appeared as an appropriately dressed and groomed man in no apparent distress. He interacted in a consistently pleasant and cooperative manner. He spoke in a normal tone of voice, maintained good eye contact and responded to all questions. He demonstrated occasional word-finding difficulties. He seemed to be a reliable informant though deferred to his wife to answer most questions about past events. His affect appeared within a wide range and without lability. His mood seemed mildly anxious. His thought processes were coherent and organized without loose associations, verbal perseverations or flight of ideas. His thought content was devoid of unusual or bizarre ideas.  Evaluation Procedures Animal Naming Test    Robert Ramsey Naming Test Controlled Oral Word Association Test Geriatric Depression Scale (short form) Rey Complex Figure: Copy Trail Making A & B Wechsler Adult Intelligence Scale-IV:   Music therapist, Coding, Digit Span, Matrix Reasoning & Similarities Wechsler Memory Scale-IV: Older Adult Battery Wide Range Achievement Test-4: Word Reading Wisconsin Card Sorting Test  Assessment Results Test Validity & Interpretive Considerations It was concluded that the test results represented a valid measure of his current cognitive functioning. He did not report or display problems with vision (he wore his eyeglasses), hearing or motor skills. He was able to comprehend task instructions without difficulty. He appeared to expend maximal effort.   His baseline intellectual potential was estimated to fall within the Average range based on demographic factors coupled with a measure of word reading (Wide Range Achievement Test-4) that represents an over-learned verbal skill relatively resistant to the effects of neurological disorder or injury.   A listing of test results can be found at the end of this report.  Speed of Processing & Attention His speed to transcribe symbols to match digits using a key (Wechsler Adult Intelligence Scale-IV (WAIS-IV) Coding) or draw lines to connect randomly arrayed numbers in sequence (Trails A) fell within the High Average and Average ranges, respectively. His attentional capacity to encode, hold and manipulate information in temporary memory was within normal expectations for both auditory and visual information based on his performances on tests that required mental rearranging of digits in reverse or ascending order (WAIS-IV Digit Span) or immediate recognition of symbols in left to right order (Wechsler Memory Scale-IV (WMS-IV) Symbol Span).  Learning & Memory Learning and memory abilities were seriously impaired. A measure of his ability to immediately recall verbal and  visual information (WMS-IV Immediate Memory Index) fell within the impaired range at the 1st percentile. All of his immediate memory subtest scores were within the subnormal range. He performed relatively better on a test of learning word pairs to multiple presentations versus recall of stories reed to him one time, which indicated that his verbal acquisition improved with repetition. His ability to recall verbal and visual information after a 20 to 30 minute delay (WMS-IV Delayed Memory Index) fell within the impaired range at below the 1st percentile. His Delayed Memory Index was lower than expected given his Immediate Memory Index, which indicated abnormal forgetting of the relatively limited amount of information he had initially learned. His delayed recall on two of the three subtests was improved somewhat on a recognition format, which suggested that his memory was deficient for both storage and retrieval.   Executive Function Executive functions comprise higher level attentional, organizational and reasoning processes that enable a person to successfully initiate, monitor, shift, plan and execute complex behavior. He performed within the Average range on time sensitive tests that required complex sequencing and set shifting (Trails B) or generating words to designated letters (Controlled Oral Word Association Test). His ability to form abstract concepts was intact (WAIS-IV Similarities & Matrix Reasoning) with a relative strength for nonverbal reasoning. Despite preserved reasoning skills, he was unable to perform a problem-solving task that required using ongoing feedback to infer and systematically apply abstract rules to sort geometric designs [employer]). This test was discontinued as he was observed to be essentially unable to maintain a conceptual idea within working memory.   Language Naming to confrontation was robust [employer]). As noted above, his phonemic fluency  (Controlled Oral Word Association Test) was within the Average range. His semantic fluency (Animal Naming Test) fell within the Low Average range. His ability to read words (Wide Range Achievement Test-4: Word Reading) fell within the Average range.   Visual-Spatial Perception & Organization There were no signs of spatial inattention or problems with visual recognition. His score on a test of visuospatial organization that required assembly of two-dimensional block designs from models (WAIS-IV Block Design) fell within the Average range. His drawing of a spatially complex geometric figure (Rey Complex Figure) was normal.    Emotional Status On the Geriatric Depression Scale (short form), his score of 3/15 was not suggestive of depression. The only items he endorsed reflected his awareness of problems with memory, fear that his memory loss will worsen and not feeling full of energy.    Summary & Conclusions Alejandra Barna is a 74 year-old man who according to his wife began to exhibit mild memory difficulties in 2011 that have become more pronounced within the past two years.   Neuropsychological evaluation indicated prominent impairment of memory for acquisition, storage and retrieval. In addition, his ability to solve novel problems was deficient, primarily due to his apparent difficulty maintaining conceptual ideas within working memory. On the positive side, his non-memory cognitive functions were relatively intact. There were no indications or report of problems with mood, behavior or psychosocial adjustment.   In conclusion, his neuropsychological profile, clinical history and current level of functioning were consistent with a diagnosis of Mild Cognitive Impairment-amnestic type. It is unlikely that his medical conditions (e.g., sleep apnea) or any emotional/motivational factors would account for his memory disorder. His prominent memory dysfunction would be  concerning for a possible underlying  neurodegenerative condition.  Diagnostic Impression Mild Cognitive Impairment, amnestic type [G31.84]  Recommendations 1. Given that he showed verbal learning with repetition, he should routinely ask that information be repeated to him at least one time. He was also advised to carry a small notebook with him so he can write down important information.   2. He should consider using a GPS to aid driving navigation.  3. He was encouraged to more regularly exercise including aerobic activities and to maintain his participation in social activities as ways to enhance his sense of wellbeing and possibly forestall further cognitive decline.  4. It was suggested that he and his wife read a book titled "Living with Mild Cognitive Impairment" by N. Anderson et al. if they wish further information.  5. The current test results comprise a baseline of his cognitive functioning. A repeat neuropsychological evaluation in one year (or sooner if clinically indicated) is recommended to track for changes, if any, in his cognitive functioning.   The results and recommendations from this evaluation were discussed with Mr. Cirelli and his wife on 03/14/15.   I have appreciated the opportunity to evaluate Mr. Sallade. Please feel free to contact me with any comments or questions.    ______________________ Jamey Ripa, Ph.D Licensed Psychologist        ADDENDUM-NEUROPSYCHOLOGICAL TEST RESULTS  His test scores were corrected to reflect norms for his age and, whenever possible, his gender and educational level (i.e., 12 years).  Animal Naming Test Score= 12 16th (adjusted for age, gender and educational level)   [place] Naming Test Score=58 /60 98th (adjusted for age, gender and educational level)   Controlled Oral Word Association Test Score= 31 words/1 repetition 34th (adjusted for age, gender and educational level)   Rey Complex Figure: copy       Score= 32/36  normal    Trails  A Score= 34s  0e 58th (adjusted for age, gender and educational level)  Trails B Score= 90s 0e 62nd (adjusted for age, gender and educational level)   Wechsler Adult Intelligence Scale-IV   Subtest Scaled Score Percentile  Block Design   9 37th     Similarities   8 25th   Digit Span  Forward               Backward               Sequencing   9  10   8   8  37th        50th    25th     25th      Matrix Reasoning  14 91st     Coding    13 84th      Wechsler Memory Scale-IV Older Adult Battery  Index Index Score Percentile  Immediate Memory   63   1st    Auditory Memory   62   1st     Visual Memory   62   1st     Delayed Memory   58 <1st     Symbol Span Scaled score= 13 84th       Wide Range Achievement Test-4 Subtest  Raw score Standard score Percentile  Word Reading 59/70 104 61st      Wisconsin Card Sorting Test Discontinued due to apparent inability to maintain correct idea in working memory

## 2015-03-17 ENCOUNTER — Other Ambulatory Visit: Payer: Self-pay | Admitting: Physician Assistant

## 2015-03-17 DIAGNOSIS — L821 Other seborrheic keratosis: Secondary | ICD-10-CM | POA: Diagnosis not present

## 2015-03-17 DIAGNOSIS — L814 Other melanin hyperpigmentation: Secondary | ICD-10-CM | POA: Diagnosis not present

## 2015-03-17 DIAGNOSIS — L820 Inflamed seborrheic keratosis: Secondary | ICD-10-CM | POA: Diagnosis not present

## 2015-03-17 DIAGNOSIS — D225 Melanocytic nevi of trunk: Secondary | ICD-10-CM | POA: Diagnosis not present

## 2015-03-25 ENCOUNTER — Encounter: Payer: Self-pay | Admitting: Cardiology

## 2015-04-01 ENCOUNTER — Ambulatory Visit (INDEPENDENT_AMBULATORY_CARE_PROVIDER_SITE_OTHER): Payer: Medicare Other | Admitting: Neurology

## 2015-04-01 DIAGNOSIS — G479 Sleep disorder, unspecified: Secondary | ICD-10-CM

## 2015-04-01 DIAGNOSIS — R9431 Abnormal electrocardiogram [ECG] [EKG]: Secondary | ICD-10-CM

## 2015-04-01 DIAGNOSIS — G4733 Obstructive sleep apnea (adult) (pediatric): Secondary | ICD-10-CM | POA: Diagnosis not present

## 2015-04-01 DIAGNOSIS — G4761 Periodic limb movement disorder: Secondary | ICD-10-CM

## 2015-04-01 NOTE — Sleep Study (Signed)
Please see the scanned sleep study interpretation located in the Procedure tab within the Chart Review section. 

## 2015-04-03 ENCOUNTER — Telehealth: Payer: Self-pay | Admitting: Neurology

## 2015-04-03 DIAGNOSIS — G4733 Obstructive sleep apnea (adult) (pediatric): Secondary | ICD-10-CM

## 2015-04-03 NOTE — Telephone Encounter (Signed)
Dr. Gladstone Ramsey patient, seen by me on 02/19/15, PSG on 03/04/15, CPAP study on 04/01/15, ins: Medicare/[employer]: Please call and inform patient that I have entered an order for treatment with positive airway pressure (PAP) treatment of obstructive sleep apnea (OSA). He did well during the latest sleep study with CPAP. We will, therefore, arrange for a machine for home use through a DME (durable medical equipment) company of His choice; and I will see the patient back in follow-up in about 8-10 weeks. Please also explain to the patient that I will be looking out for compliance data, which can be downloaded from the machine (stored on an SD card, that is inserted in the machine) or via remote access through a modem, that is built into the machine. At the time of the followup appointment we will discuss sleep study results and how it is going with PAP treatment at home. Please advise patient to bring His machine at the time of the first FU visit, even though this is cumbersome. Bringing the machine for every visit after that will likely not be needed, but often helps for the first visit to troubleshoot if needed. Please re-enforce the importance of compliance with treatment and the need for [place] to monitor compliance data - often an insurance requirement and actually good feedback for the patient as far as how they are doing.  Also remind patient, that any interim PAP machine or mask issues should be first addressed with the DME company, as they can often help better with technical and mask fit issues. Please ask if patient has a preference regarding DME company.  Please also make sure, the patient has a follow-up appointment with me in about 8-10 weeks from the setup date, thanks.  Once you have spoken to the patient - and faxed/routed report to PCP and referring MD (if other than PCP), you can close this encounter, thanks,   Star Age, MD, PhD [facility] ([place])

## 2015-04-03 NOTE — Telephone Encounter (Signed)
I spoke to patient and he is aware of results. He would like to proceed with treatment. I will refer to [facility]. I will send report to PCP. And send a letter to patient to remind him to keep appt in November and express the importance of compliance.

## 2015-04-09 ENCOUNTER — Encounter: Payer: Self-pay | Admitting: Internal Medicine

## 2015-04-14 ENCOUNTER — Telehealth: Payer: Self-pay

## 2015-04-14 NOTE — Telephone Encounter (Signed)
Opened in error

## 2015-04-21 ENCOUNTER — Encounter: Payer: Self-pay | Admitting: Diagnostic Neuroimaging

## 2015-04-21 ENCOUNTER — Ambulatory Visit (INDEPENDENT_AMBULATORY_CARE_PROVIDER_SITE_OTHER): Payer: Medicare Other | Admitting: Diagnostic Neuroimaging

## 2015-04-21 VITALS — BP 120/75 | HR 71 | Ht 69.0 in | Wt 188.6 lb

## 2015-04-21 DIAGNOSIS — G4733 Obstructive sleep apnea (adult) (pediatric): Secondary | ICD-10-CM

## 2015-04-21 DIAGNOSIS — I2510 Atherosclerotic heart disease of native coronary artery without angina pectoris: Secondary | ICD-10-CM

## 2015-04-21 DIAGNOSIS — R413 Other amnesia: Secondary | ICD-10-CM | POA: Diagnosis not present

## 2015-04-21 NOTE — Progress Notes (Signed)
[facility]  PATIENT: Robert Ramsey DOB: 07/24/1940  REFERRING CLINICIAN: [place] HISTORY FROM: patient and wife [name]) REASON FOR VISIT: follow up    HISTORICAL  CHIEF COMPLAINT:  Chief Complaint  Patient presents with  . Memory Loss    rm 7, wife [name], MMSE 25  . Follow-up    3 month    HISTORY OF PRESENT ILLNESS:   UPDATE 04/21/15: Since last visit, memory loss symptoms are stable. Had neuropsych testing, showing dx of mild cognitive impairment (amnestic type). Overall stable. Also with sleep apnea diagnosis and now on CPAP since Apr 12, 2015, getting used to it but sometimes wakes up at night with nasal mask leak or mouth breathing.   PRIOR HPI (01/15/15): 74 year old right-handed male here for evaluation of memory loss. History of hypertension, hyperkalemia, coronary artery disease. Over past 2 months patient has had increasing problem with navigation while driving the car. He tends to take along with around to get from point A to point B. Even if this is a similar location. Over the past 6 months she is having to increase in the use shopping lists. Sometimes he has difficulty comprehending instructions from his wife. 2011 patient had some mild memory problems and had evaluation at that time. 2014 there is more progression. In the past 6-12 months more problems with short-term memory problems, recent conversations, saying the wrong name the streets, saying the wrong name of people including family members, word finding difficulties. Patient's wife gave an example of sending him to the store to purchase yellow cake mix and then patient having extreme difficulty finding the correct item in spite of 3 phone calls while he was at the grocery store.    REVIEW OF SYSTEMS: Full 14 system review of systems performed and notable only for fatigue apnea daytime sleepiness freq urination urgency.    ALLERGIES: Allergies  Allergen Reactions  . Cefpodoxime  Proxetil     REACTION: Colitis  . Cinnamon   . Flexeril [Cyclobenzaprine Hcl]     Possible heart side effects  . Imodium [Loperamide Hcl]     unknown  . Levofloxacin     unknown  . Nabumetone     REACTION: Heart issues/hospital admission  . Naproxen     Effects blood pressure and heart  . Nsaids     Effects blood pressure and heart  . Vantin     Pseudo colitis  . Vioxx [Rofecoxib]     Effects blood pressure and heart    HOME MEDICATIONS: Outpatient Prescriptions Prior to Visit  Medication Sig Dispense Refill  . aspirin 81 MG tablet Take 81 mg by mouth daily.    . metoprolol (LOPRESSOR) 50 MG tablet Take 1 tablet by mouth two  times daily 180 tablet 1  . Multiple Vitamin (MULTIVITAMIN) tablet Take 1 tablet by mouth daily.      . ranitidine (ZANTAC) 150 MG tablet     . rosuvastatin (CRESTOR) 5 MG tablet Take 0.5 tablets (2.5 mg total) by mouth as directed. 45 tablet 1  . VESICARE 10 MG tablet Take 10 mg by mouth daily.     Marland Kitchen ZETIA 10 MG tablet Take 1 tablet by mouth  daily 90 tablet 0   No facility-administered medications prior to visit.    PAST MEDICAL HISTORY: Past Medical History  Diagnosis Date  . HTN (hypertension)   . Complete heart block [facility]) May 2011    has PTVP in place  . Ventral hernia   .  History of colonoscopy   . Memory impairment     better with lower dose statin  . PVC's (premature ventricular contractions)   . Abnormal stress test 09/15/10    SEPTAL DEFECT SECONDARY TO PREVIOUS INFARCT VS LBBB; MILD SEPTAL ISCHEMIA  . CAD (coronary artery disease)     cath in 2007 reveals mild diffuse CAD  . Glucose intolerance (impaired glucose tolerance)   . Pacemaker     PAST SURGICAL HISTORY: Past Surgical History  Procedure Laterality Date  . Right nephrectomy  01/2006    partial for mass  . Cataract surgery  06/2009, 10/2014  . Appendectomy    . Pacemaker insertion  11/18/09    MDT implanted by Dr Doreatha Lew  . Cardiac catheterization  07/29/2005    EF  50-55%; Has mild diffuse CAD  . Lbbb      FAMILY HISTORY: Family History  Problem Relation Age of Onset  . Heart failure Father   . Leukemia Mother     SOCIAL HISTORY:  Social History   Social History  . Marital Status: Married    Spouse Name: [name]  . Number of Children: 4  . Years of Education: 12   Occupational History  . Retired     [employer]   Social History Main Topics  . Smoking status: Former Smoker    Quit date: 07/06/1983  . Smokeless tobacco: Not on file     Comment: stopped smoking in 1985  . Alcohol Use: No  . Drug Use: No  . Sexual Activity: Not Currently   Other Topics Concern  . Not on file   Social History Narrative   Retired from [employer], [employer].   Lives at home with wife   Caffeine use- morning coffee and 2 cup/ 1 cup of tea at lunch      PHYSICAL EXAM  GENERAL EXAM/CONSTITUTIONAL: Vitals:  Filed Vitals:   04/21/15 1255  BP: 120/75  Pulse: 71  Height: 5\' 9"  (1.753 m)  Weight: 188 lb 9.6 oz (85.548 kg)   Body mass index is 27.84 kg/(m^2). No exam data present  Patient is in no distress; well developed, nourished and groomed; neck is supple; MASKED FACIES  CARDIOVASCULAR:  Examination of carotid arteries is normal; no carotid bruits  Regular rate and rhythm, no murmurs  Examination of peripheral vascular system by observation and palpation is normal  EYES:  Ophthalmoscopic exam of optic discs and posterior segments is normal; no papilledema or hemorrhages  MUSCULOSKELETAL:  Gait, strength, tone, movements noted in Neurologic exam below  NEUROLOGIC: MENTAL STATUS:  MMSE - Mini Mental State Exam 04/21/2015 01/15/2015  Orientation to time 5 5  Orientation to Place 5 5  Registration 3 3  Attention/ Calculation 4 5  Recall 1 1  Language- name 2 objects 2 2  Language- repeat 1 1  Language- follow 3 step command 3 3  Language- read & follow direction 1 1  Write a sentence 1 1  Copy design 1 1  Total score 27 28     awake, alert, oriented to person, place and time  recent and remote memory intact; EXCEPT 1/3 RECALL.  normal attention and concentration; EXCEPT 4/5 ATTENTION  language fluent, comprehension intact, naming intact,   fund of knowledge appropriate  CRANIAL NERVE:   2nd - no papilledema on fundoscopic exam  2nd, 3rd, 4th, 6th - pupils equal and reactive to light, visual fields full to confrontation, extraocular muscles intact, no nystagmus  5th - facial sensation  symmetric  7th - facial strength symmetric  8th - hearing intact  9th - palate elevates symmetrically, uvula midline  11th - shoulder shrug symmetric  12th - tongue protrusion midline  MOTOR:   normal bulk and tone; full strength in the BUE, BLE  SENSORY:   normal and symmetric to light touch, pinprick, temperature, vibration; DECR IN RIGHT FOOT  COORDINATION:   finger-nose-finger, fine finger movements, heel-shin normal  REFLEXES:   deep tendon reflexes present and symmetric; NO FRONTAL RELEASE SIGNS  GAIT/STATION:   narrow based gait; romberg is negative    DIAGNOSTIC DATA (LABS, IMAGING, TESTING) - I reviewed patient records, labs, notes, testing and imaging myself where available.  Lab Results  Component Value Date   WBC 6.4 12/16/2014   HGB 15.2 12/16/2014   HCT 45.1 12/16/2014   MCV 95.1 12/16/2014   PLT 223 12/16/2014      Component Value Date/Time   NA 137 12/23/2014 0840   K 4.4 12/23/2014 0840   CL 108 12/23/2014 0840   CO2 24 12/23/2014 0840   GLUCOSE 101* 12/23/2014 0840   BUN 25* 12/23/2014 0840   CREATININE 1.07 12/23/2014 0840   CREATININE 0.96 12/16/2014 1252   CALCIUM 9.8 12/23/2014 0840   PROT 6.5 12/23/2014 0840   ALBUMIN 3.9 12/23/2014 0840   AST 22 12/23/2014 0840   ALT 18 12/23/2014 0840   ALKPHOS 51 12/23/2014 0840   BILITOT 0.3 12/23/2014 0840   Lab Results  Component Value Date   CHOL 112 12/23/2014   HDL 34.30* 12/23/2014   LDLCALC 53 12/23/2014    TRIG 126.0 12/23/2014   CHOLHDL 3 12/23/2014   Lab Results  Component Value Date   HGBA1C 5.8* 12/16/2014   Lab Results  Component Value Date   VITAMINB12 947* 12/16/2014   Lab Results  Component Value Date   TSH 2.242 12/16/2014    12/23/14 CT head [I reviewed images myself and agree with interpretation. -VRP]  - Atrophy. No acute findings.     ASSESSMENT AND PLAN  74 y.o. year old male here with progressive short-term memory problems, navigation difficulty, confusion, over past 6-12 months. Mild symptoms started in 2011. Now with sleep apnea diagnosis and on CPAP since Oct 8. 2016.   Ddx: mild cognitive impairment vs pseudo-dementia (sleep apnea, depression)  Memory loss  OSA (obstructive sleep apnea)    PLAN: I spent 15 minutes of face to face time with patient. Greater than 50% of time was spent in counseling and coordination of care with patient. In summary we discussed:  - continue CPAP - stay active; physical and mental stimulation - general health strategies and advanced care planning reviewed  Return in about 6 months (around 10/20/2015).    Penni Bombard, MD 31/51/7616, 0:73 PM Certified in Neurology, Neurophysiology and Neuroimaging  [facility] [address], [place], [place] [address] [phone]

## 2015-04-21 NOTE — Patient Instructions (Signed)
Thank you for coming to see [place] at [facility]. I hope we have been able to provide you high quality care today.  You may receive a patient satisfaction survey over the next few weeks. We would appreciate your feedback and comments so that we may continue to improve ourselves and the health of our patients.  - continue CPAP therapy for sleep apnea - enjoy life and stay active!   ~~~~~~~~~~~~~~~~~~~~~~~~~~~~~~~~~~~~~~~~~~~~~~~~~~~~~~~~~~~~~~~~~  DR. PENUMALLI'S GUIDE TO HAPPY AND HEALTHY LIVING These are some of my general health and wellness recommendations. Some of them may apply to you better than others. Please use common sense as you try these suggestions and feel free to ask me any questions.   ACTIVITY/FITNESS Mental, social, emotional and physical stimulation are very important for brain and body health. Try learning a new activity (arts, music, language, sports, games).  Keep moving your body to the best of your abilities. You can do this at home, inside or outside, the park, community center, gym or anywhere you like. Consider a physical therapist or personal trainer to get started. Consider the app Sworkit. Fitness trackers such as smart-watches, smart-phones or Fitbits can help as well.   NUTRITION Eat more plants: colorful vegetables, nuts, seeds and berries.  Eat less sugar, salt, preservatives and processed foods.  Avoid toxins such as cigarettes and alcohol.  Drink water when you are thirsty. Warm water with a slice of lemon is an excellent morning drink to start the day.  Consider these websites for more information The Nutrition Source (https://www.henry-hernandez.biz/) [employer] Nutrition (WindowBlog.ch)   RELAXATION Consider practicing mindfulness meditation or other relaxation techniques such as deep breathing, prayer, yoga, tai chi, massage. See website mindful.org or the apps Headspace or Calm to  help get started.   SLEEP Try to get at least 7-8+ hours sleep per day. Regular exercise and reduced caffeine will help you sleep better. Practice good sleep hygeine techniques. See website sleep.org for more information.   PLANNING Prepare estate planning, living will, healthcare POA documents. Sometimes this is best planned with the help of an attorney. Theconversationproject.org and agingwithdignity.org are excellent resources.

## 2015-04-24 ENCOUNTER — Ambulatory Visit (INDEPENDENT_AMBULATORY_CARE_PROVIDER_SITE_OTHER): Payer: Medicare Other | Admitting: Internal Medicine

## 2015-04-24 VITALS — BP 130/70 | HR 84 | Temp 98.0°F | Resp 18 | Wt 188.0 lb

## 2015-04-24 DIAGNOSIS — Z23 Encounter for immunization: Secondary | ICD-10-CM | POA: Diagnosis not present

## 2015-04-24 DIAGNOSIS — R109 Unspecified abdominal pain: Secondary | ICD-10-CM | POA: Diagnosis not present

## 2015-04-24 NOTE — Progress Notes (Signed)
Patient seen in office for flu vaccine, tolerated well.

## 2015-05-08 ENCOUNTER — Other Ambulatory Visit: Payer: Self-pay | Admitting: Internal Medicine

## 2015-05-27 ENCOUNTER — Ambulatory Visit (INDEPENDENT_AMBULATORY_CARE_PROVIDER_SITE_OTHER): Payer: Medicare Other | Admitting: Neurology

## 2015-05-27 ENCOUNTER — Encounter: Payer: Self-pay | Admitting: Neurology

## 2015-05-27 VITALS — BP 136/72 | HR 76 | Resp 16 | Ht 69.0 in | Wt 189.0 lb

## 2015-05-27 DIAGNOSIS — I2510 Atherosclerotic heart disease of native coronary artery without angina pectoris: Secondary | ICD-10-CM | POA: Diagnosis not present

## 2015-05-27 DIAGNOSIS — Z9989 Dependence on other enabling machines and devices: Principal | ICD-10-CM

## 2015-05-27 DIAGNOSIS — G4733 Obstructive sleep apnea (adult) (pediatric): Secondary | ICD-10-CM | POA: Diagnosis not present

## 2015-05-27 DIAGNOSIS — R413 Other amnesia: Secondary | ICD-10-CM | POA: Diagnosis not present

## 2015-05-27 NOTE — Patient Instructions (Signed)

## 2015-05-27 NOTE — Progress Notes (Signed)
Subjective:    Patient ID: Robert Ramsey is a 74 y.o. male.  HPI     Interim history:   Robert Ramsey is a very pleasant 74 year old right-handed gentleman with an underlying medical history of hypertension, complete heart block, hyperlipidemia, PVCs, coronary artery disease, impaired glucose tolerance, reflux disease, overweight state, memory loss (followed by Dr. Leta Baptist), status post cataract surgery, appendectomy, pacemaker placement and right nephrectomy, who presents for follow-up consultation of his obstructive sleep apnea, after his recent sleep studies. The patient is accompanied by his wife again today. I first met him on 02/19/2015 at the request of Dr. Leta Baptist, at which time the patient reported snoring and excessive daytime somnolence as well as a prior diagnosis of obstructive sleep apnea in 1995. I invited him back for sleep study testing. He had a baseline sleep study, followed by a CPAP titration study and I went over his test results with him in detail today. His baseline sleep study from 03/04/2015 showed a sleep efficiency of 73.3%, latency to sleep was 21 minutes and wake after sleep onset was 99 minutes with mild to moderate sleep fragmentation noted. His arousal index was 10.6 per hour. He had an increased percentage of stage II sleep, absence of slow-wave sleep and low percentage of REM sleep at 4.9% with a very prolonged REM latency of 287.5 minutes. He had no significant PLMS. He had occasional PVCs and PACs on EKG. He had intermittent mild snoring. Total AHI was borderline at 5.3 per hour, rising to 52.5 per hour during REM sleep and 10.6 per hour in the supine position. Average oxygen saturation was 93%, nadir was 80%. Time below 90% saturation was 21 minutes, time below 88% saturation was 9 minutes. Based on his sleep related complaints, his cognitive complaints, and his medical history, I suggested he return for a second sleep study for CPAP titration. He had this on  04/01/2015. Sleep efficiency was 76.5%, sleep latency was 26.5 minutes and wake after sleep onset was 50.5 minutes with mild sleep fragmentation noted. He had a normal arousal index. He had an increased percentage of stage II sleep, absence of slow-wave sleep and REM sleep at 11.6% with a mildly prolonged REM latency of 138 minutes. He had moderate PLMS with an index of 26.7 per hour, resulting in only 3.5 arousals per hour. He had rare PVCs on EKG. He had no significant snoring. CPAP was titrated from 5 cm to 9 cm. AHI was 0 per hour on the final pressure with supine REM sleep achieved on the final pressure. Based on his test results are prescribed CPAP therapy for home use.   Today, 05/27/2015: I reviewed his CPAP compliance data from 04/26/2015 through 05/25/2015 which is a total of 30 days during which time he used his machine every night with percent used days greater than 4 hours at 100%, indicating superb compliance with an average usage of 8 hours, residual AHI low at 0.3 per hour, leak acceptable with the 95th percentile at 14.7 L/m on a pressure of 9 cm with EPR of 3.  Today, 05/27/2015: He reports having adjusted well, he is a little more rested, seems more interested in doing things. Memory is about the same. Overall, feels somewhat better and would not mind continuing with CPAP. He still likes to sit in the recliner with his cat and dozes off. He switched from nasal pillows to a full face mask which he prefers. The nasal pillows cause soreness around the nostrils.  Previously:  02/19/2015: He reports snoring and excessive daytime somnolence. I reviewed your office note from 01/15/2015. He noticed some hearing loss and referred him for audiometry.  His snoring has improved a little, worse on his back, per wife. He denies morning headaches and has occasional nocturia. He denies RLS symptoms and does not tend to kicks his legs in his sleep. Bedtime is around MN and falling asleep is usually not a  problem. He does watch TV prior to going to bed but typically not while in bed. He had sleep study testing in 1995 which per wife showed mild obstructive sleep apnea but not enough to use CPAP. CPAP was discussed at the time however. He is scheduled for neuropsychological evaluation in September. He has hearing testing pending for later this month and a follow-up with you in October. He reports mild snoring. Very occasionally has woken himself up with a sense of gasping or snorting. His wife has noticed occasional snorting sounds. He denies any overt family history of obstructive sleep apnea. He used to smoke a pipe but quit in 1987. He drinks coffee, 2 cups each morning and occasional tea for lunch. He does not drink any alcohol. He had 3 brothers. One is alive , 1 died from a heart attack and the other brother died after a truck accident. His main complaint is lack of energy during the day. Rise time is around 8 or 8:30. He does not always wake up rested and used to take extended naps but his cardiologist advised him to be more active during the day and less sedentary. He does like to nap. His Epworth sleepiness score is 8 out of 24 today, his fatigue scores 51 out of 63.   His Past Medical History Is Significant For: Past Medical History  Diagnosis Date  . HTN (hypertension)   . Complete heart block [facility]) May 2011    has PTVP in place  . Ventral hernia   . History of colonoscopy   . Memory impairment     better with lower dose statin  . PVC's (premature ventricular contractions)   . Abnormal stress test 09/15/10    SEPTAL DEFECT SECONDARY TO PREVIOUS INFARCT VS LBBB; MILD SEPTAL ISCHEMIA  . CAD (coronary artery disease)     cath in 2007 reveals mild diffuse CAD  . Glucose intolerance (impaired glucose tolerance)   . Pacemaker     His Past Surgical History Is Significant For: Past Surgical History  Procedure Laterality Date  . Right nephrectomy  01/2006    partial for mass  . Cataract  surgery  06/2009, 10/2014  . Appendectomy    . Pacemaker insertion  11/18/09    MDT implanted by Dr Doreatha Lew  . Cardiac catheterization  07/29/2005    EF 50-55%; Has mild diffuse CAD  . Lbbb      His Family History Is Significant For: Family History  Problem Relation Age of Onset  . Heart failure Father   . Leukemia Mother     His Social History Is Significant For: Social History   Social History  . Marital Status: Married    Spouse Name: Robert Ramsey  . Number of Children: 4  . Years of Education: 12   Occupational History  . Retired     [employer]   Social History Main Topics  . Smoking status: Former Smoker    Quit date: 07/06/1983  . Smokeless tobacco: None     Comment: stopped smoking in 1985  . Alcohol Use:  No  . Drug Use: No  . Sexual Activity: Not Currently   Other Topics Concern  . None   Social History Narrative   Retired from [employer].   Lives at home with wife   Caffeine use- morning coffee and 2 cup/ 1 cup of tea at lunch     His Allergies Are:  Allergies  Allergen Reactions  . Cinnamon   . Flexeril [Cyclobenzaprine Hcl]     Possible heart side effects  . Imodium [Loperamide Hcl]     unknown  . Levofloxacin Other (See Comments)    Elevated heart rate  . Nabumetone     REACTION: Heart issues/hospital admission  . Naproxen     Effects blood pressure and heart  . Nsaids     Effects blood pressure and heart  . Vantin     Pseudo colitis  . Vioxx [Rofecoxib]     Effects blood pressure and heart  :   His Current Medications Are:  Outpatient Encounter Prescriptions as of 05/27/2015  Medication Sig  . aspirin 81 MG tablet Take 81 mg by mouth daily.  . metoprolol (LOPRESSOR) 50 MG tablet Take 1 tablet by mouth two  times daily  . Multiple Vitamin (MULTIVITAMIN) tablet Take 1 tablet by mouth daily.    . ranitidine (ZANTAC) 150 MG tablet 2 (two) times daily.   . rosuvastatin (CRESTOR) 5 MG tablet Take 0.5 tablets (2.5 mg total) by mouth as  directed.  . VESICARE 10 MG tablet Take 10 mg by mouth daily.   Marland Kitchen ZETIA 10 MG tablet Take 1 tablet by mouth  daily   No facility-administered encounter medications on file as of 05/27/2015.  :  Review of Systems:  Out of a complete 14 point review of systems, all are reviewed and negative with the exception of these symptoms as listed below:   Review of Systems  Neurological:       Patient states that he is adapting to the CPAP machine. Received a new mask which makes CPAP use easier. No new concerns.     Objective:  Neurologic Exam  Physical Exam Physical Examination:   Filed Vitals:   05/27/15 1452  BP: 136/72  Pulse: 76  Resp: 16    General Examination: The patient is a very pleasant 74 y.o. male in no acute distress. He appears well-developed and well-nourished and well groomed. He is in good spirits today.  HEENT: Normocephalic, atraumatic, pupils are equal, round and reactive to light and accommodation. Funduscopic exam is normal with sharp disc margins noted. Extraocular tracking is good without limitation to gaze excursion or nystagmus noted. Normal smooth pursuit is noted. Hearing is grossly intact. Face is symmetric with normal facial animation and normal facial sensation. Speech is clear with no dysarthria noted. There is no hypophonia. There is no lip, neck/head, jaw or voice tremor. Neck is supple with full range of passive and active motion. There are no carotid bruits on auscultation. Oropharynx exam reveals: mild mouth dryness, adequate dental hygiene and mild airway crowding, due to redundant soft palate and thicker tongue. Mallampati is class II. Tongue protrudes centrally and palate elevates symmetrically. Tonsils are small in size. He has a Mild overbite. Nasal inspection reveals no significant nasal mucosal bogginess or redness and no septal deviation.   Chest: Clear to auscultation without wheezing, rhonchi or crackles noted.  Heart: S1+S2+0, regular and normal  without murmurs, rubs or gallops noted.   Abdomen: Soft, non-tender and non-distended with normal bowel sounds  appreciated on auscultation.  Extremities: There is trace pitting edema in the distal lower extremities bilaterally. He has a compression stocking on the right, up to knee, unchanged. Pedal pulses are intact.  Skin: Warm and dry without trophic changes noted. There are no varicose veins.  Musculoskeletal: exam reveals no obvious joint deformities, tenderness or joint swelling or erythema.   Neurologically:  Mental status: The patient is awake, alert and oriented in all 4 spheres. His immediate and remote memory, attention, language skills and fund of knowledge are fairly appropriate. There is no evidence of aphasia, agnosia, apraxia or anomia. Speech is clear with normal prosody and enunciation. Thought process is linear. Mood is normal and affect is normal.  Cranial nerves II - XII are as described above under HEENT exam. In addition: shoulder shrug is normal with equal shoulder height noted. Motor exam: Normal bulk, strength and tone is noted. There is no drift, tremor or rebound. Romberg is negative. Reflexes are 2+ throughout. Fine motor skills and coordination: intact.  Cerebellar testing: No dysmetria or intention tremor on finger to nose testing. Sensory exam: intact to light touch in the upper and lower extremities.  Gait, station and balance: He stands easily. No veering to one side is noted. No leaning to one side is noted. Posture is age-appropriate and stance is narrow based. Gait shows normal stride length and normal pace. No problems turning are noted. He turns en bloc.     Assessment and Plan:   In summary, Robert Ramsey is a very pleasant 74 year old male with an underlying medical history of hypertension, complete heart block, hyperlipidemia, PVCs, coronary artery disease, impaired glucose tolerance, reflux disease, overweight state, memory loss, status post cataract  surgery, appendectomy, pacemaker placement and right nephrectomy, who presents for follow-up consultation of his overall mild obstructive sleep apnea, with significant REM predominance, after his recent sleep studies in August 2016 and September 2016. He has done well on CPAP therapy at a pressure of 9 cm. Today, we talked about his test results in detail and also discussed the most recent compliance data in detail. He is congratulated on his excellent treatment compliance. He has noted an improvement in his sleep and memory has remained stable.  I explained to the patient and his wife that sometimes things like memory scores, cognitive function in general, mood related issues and blood pressure etc take longer to improve. He has been on treatment for about 6 weeks at this time. His exam is stable. At this juncture, I would like for him to continue with treatment with the current settings.  I again explained in particular the risks and ramifications of untreated moderate to severe OSA, especially with respect to developing cardiovascular disease down the Road, including congestive heart failure, difficult to treat hypertension, cardiac arrhythmias, or stroke. Even type 2 diabetes has, in part, been linked to untreated OSA. Symptoms of untreated OSA include daytime sleepiness, memory problems, mood irritability and mood disorder such as depression and anxiety, lack of energy, as well as recurrent headaches, especially morning headaches. We talked about trying to maintain a healthy lifestyle in general, as well as the importance of weight control. I encouraged the patient to eat healthy, exercise daily and keep well hydrated, to keep a scheduled bedtime and wake time routine, to not skip any meals and eat healthy snacks in between meals. I advised the patient not to drive when feeling sleepy. I recommended the following at this time: Continue using CPAP regularly and continue  to strive for weight loss. I explained  the importance of being compliant with PAP treatment, not only for insurance purposes but primarily to improve his symptoms, and for the patient's long term health benefit, including to reduce cardiovascular risks. Since he is doing well from my end of things I would like to see him back in 6 months and yearly thereafter if he continues to do well. he has an appointment with Dr. Leta Baptist in April, which he is encouraged to keep.  I answered all their questions today and the patient and his wife were in agreement. I encouraged them to call with any interim questions, concerns, problems or updates.  I spent 25 minutes in total face-to-face time with the patient, more than 50% of which was spent in counseling and coordination of care, reviewing test results, reviewing medication and discussing or reviewing the diagnosis of OSA and memory loss, its prognosis and treatment options.

## 2015-06-02 DIAGNOSIS — H353131 Nonexudative age-related macular degeneration, bilateral, early dry stage: Secondary | ICD-10-CM | POA: Diagnosis not present

## 2015-06-02 DIAGNOSIS — H5000 Unspecified esotropia: Secondary | ICD-10-CM | POA: Diagnosis not present

## 2015-06-02 DIAGNOSIS — H04123 Dry eye syndrome of bilateral lacrimal glands: Secondary | ICD-10-CM | POA: Diagnosis not present

## 2015-06-02 DIAGNOSIS — H52203 Unspecified astigmatism, bilateral: Secondary | ICD-10-CM | POA: Diagnosis not present

## 2015-06-03 ENCOUNTER — Ambulatory Visit (INDEPENDENT_AMBULATORY_CARE_PROVIDER_SITE_OTHER): Payer: Medicare Other | Admitting: *Deleted

## 2015-06-03 DIAGNOSIS — I442 Atrioventricular block, complete: Secondary | ICD-10-CM | POA: Diagnosis not present

## 2015-06-04 ENCOUNTER — Ambulatory Visit: Payer: Self-pay | Admitting: Neurology

## 2015-06-04 NOTE — Progress Notes (Signed)
Remote pacemaker transmission.   

## 2015-06-12 LAB — CUP PACEART REMOTE DEVICE CHECK
Battery Voltage: 2.79 V
Brady Statistic AP VS Percent: 0 %
Brady Statistic AS VP Percent: 73 %
Date Time Interrogation Session: 20161129143107
Implantable Lead Implant Date: 20110517
Implantable Lead Implant Date: 20110517
Implantable Lead Location: 753859
Lead Channel Impedance Value: 651 Ohm
Lead Channel Setting Pacing Amplitude: 2 V
Lead Channel Setting Pacing Amplitude: 2.5 V
Lead Channel Setting Pacing Pulse Width: 0.4 ms
MDC IDC LEAD LOCATION: 753860
MDC IDC LEAD SERIAL: 543103
MDC IDC LEAD SERIAL: 672341
MDC IDC MSMT BATTERY IMPEDANCE: 224 Ohm
MDC IDC MSMT BATTERY REMAINING LONGEVITY: 115 mo
MDC IDC MSMT LEADCHNL RV IMPEDANCE VALUE: 736 Ohm
MDC IDC SET LEADCHNL RV SENSING SENSITIVITY: 2 mV
MDC IDC STAT BRADY AP VP PERCENT: 27 %
MDC IDC STAT BRADY AS VS PERCENT: 0 %

## 2015-06-13 ENCOUNTER — Encounter: Payer: Self-pay | Admitting: Cardiology

## 2015-07-04 DIAGNOSIS — M25552 Pain in left hip: Secondary | ICD-10-CM | POA: Diagnosis not present

## 2015-07-04 DIAGNOSIS — M76892 Other specified enthesopathies of left lower limb, excluding foot: Secondary | ICD-10-CM | POA: Diagnosis not present

## 2015-07-10 DIAGNOSIS — M25552 Pain in left hip: Secondary | ICD-10-CM | POA: Diagnosis not present

## 2015-07-10 DIAGNOSIS — M76892 Other specified enthesopathies of left lower limb, excluding foot: Secondary | ICD-10-CM | POA: Diagnosis not present

## 2015-08-14 ENCOUNTER — Other Ambulatory Visit: Payer: Self-pay | Admitting: Internal Medicine

## 2015-08-15 ENCOUNTER — Encounter: Payer: Self-pay | Admitting: Internal Medicine

## 2015-08-29 DIAGNOSIS — L570 Actinic keratosis: Secondary | ICD-10-CM | POA: Diagnosis not present

## 2015-08-29 DIAGNOSIS — L0101 Non-bullous impetigo: Secondary | ICD-10-CM | POA: Diagnosis not present

## 2015-09-02 ENCOUNTER — Encounter: Payer: Self-pay | Admitting: Internal Medicine

## 2015-09-02 ENCOUNTER — Ambulatory Visit (INDEPENDENT_AMBULATORY_CARE_PROVIDER_SITE_OTHER): Payer: Medicare Other | Admitting: Internal Medicine

## 2015-09-02 VITALS — BP 138/74 | HR 68 | Ht 69.0 in | Wt 192.6 lb

## 2015-09-02 DIAGNOSIS — I2510 Atherosclerotic heart disease of native coronary artery without angina pectoris: Secondary | ICD-10-CM

## 2015-09-02 DIAGNOSIS — I442 Atrioventricular block, complete: Secondary | ICD-10-CM

## 2015-09-02 DIAGNOSIS — I10 Essential (primary) hypertension: Secondary | ICD-10-CM

## 2015-09-02 LAB — CUP PACEART INCLINIC DEVICE CHECK
Brady Statistic AP VS Percent: 0 %
Brady Statistic AS VS Percent: 0 %
Date Time Interrogation Session: 20170228165739
Implantable Lead Implant Date: 20110517
Implantable Lead Location: 753860
Implantable Lead Model: 4469
Implantable Lead Serial Number: 543103
Lead Channel Impedance Value: 718 Ohm
Lead Channel Pacing Threshold Amplitude: 0.5 V
Lead Channel Pacing Threshold Amplitude: 0.5 V
Lead Channel Pacing Threshold Pulse Width: 0.4 ms
Lead Channel Setting Pacing Amplitude: 2 V
Lead Channel Setting Pacing Amplitude: 2.5 V
Lead Channel Setting Pacing Pulse Width: 0.4 ms
Lead Channel Setting Sensing Sensitivity: 2 mV
MDC IDC LEAD IMPLANT DT: 20110517
MDC IDC LEAD LOCATION: 753859
MDC IDC LEAD SERIAL: 672341
MDC IDC MSMT BATTERY IMPEDANCE: 225 Ohm
MDC IDC MSMT BATTERY REMAINING LONGEVITY: 114 mo
MDC IDC MSMT BATTERY VOLTAGE: 2.79 V
MDC IDC MSMT LEADCHNL RA IMPEDANCE VALUE: 640 Ohm
MDC IDC MSMT LEADCHNL RA PACING THRESHOLD AMPLITUDE: 0.5 V
MDC IDC MSMT LEADCHNL RA PACING THRESHOLD PULSEWIDTH: 0.4 ms
MDC IDC MSMT LEADCHNL RA SENSING INTR AMPL: 4 mV
MDC IDC MSMT LEADCHNL RV PACING THRESHOLD PULSEWIDTH: 0.4 ms
MDC IDC STAT BRADY AP VP PERCENT: 30 %
MDC IDC STAT BRADY AS VP PERCENT: 69 %

## 2015-09-02 MED ORDER — METOPROLOL TARTRATE 25 MG PO TABS: 25.0000 mg | ORAL_TABLET | Freq: Two times a day (BID) | ORAL | Status: DC

## 2015-09-02 NOTE — Patient Instructions (Signed)
Medication Instructions:  Your physician has recommended you make the following change in your medication:  1) Decrease Metoprolol to 25 mg twice daily    Labwork: Your physician recommends that you return for lab work in 6 months same day you see Truitt Merle, NP---fasting lipid and liver   Testing/Procedures: None ordered   Follow-Up: Your physician wants you to follow-up in: 6 months with Truitt Merle, NP and 12 months with Dr Vallery Ridge will receive a reminder letter in the mail two months in advance. If you don't receive a letter, please call our office to schedule the follow-up appointment.  Remote monitoring is used to monitor your Pacemaker  from home. This monitoring reduces the number of office visits required to check your device to one time per year. It allows [place] to keep an eye on the functioning of your device to ensure it is working properly. You are scheduled for a device check from home on 12/02/15. You may send your transmission at any time that day. If you have a wireless device, the transmission will be sent automatically. After your physician reviews your transmission, you will receive a postcard with your next transmission date.     Any Other Special Instructions Will Be Listed Below (If Applicable).     If you need a refill on your cardiac medications before your next appointment, please call your pharmacy.

## 2015-09-02 NOTE — Progress Notes (Signed)
PCP: Elby Showers, MD  Robert Ramsey is a 75 y.o. male who presents today for routine electrophysiology followup.  Since last being seen in our clinic, the patient reports doing very well.   Today, he denies symptoms of palpitations, chest pain, shortness of breath,  lower extremity edema, dizziness, presyncope, or syncope.  The patient is otherwise without complaint today.   Past Medical History  Diagnosis Date  . HTN (hypertension)   . Complete heart block [facility]) May 2011    has PTVP in place  . Ventral hernia   . History of colonoscopy   . Memory impairment     better with lower dose statin  . PVC's (premature ventricular contractions)   . Abnormal stress test 09/15/10    SEPTAL DEFECT SECONDARY TO PREVIOUS INFARCT VS LBBB; MILD SEPTAL ISCHEMIA  . CAD (coronary artery disease)     cath in 2007 reveals mild diffuse CAD  . Glucose intolerance (impaired glucose tolerance)   . Pacemaker    Past Surgical History  Procedure Laterality Date  . Right nephrectomy  01/2006    partial for mass  . Cataract surgery  06/2009, 10/2014  . Appendectomy    . Pacemaker insertion  11/18/09    MDT implanted by Dr Doreatha Lew  . Cardiac catheterization  07/29/2005    EF 50-55%; Has mild diffuse CAD  . Lbbb      Current Outpatient Prescriptions  Medication Sig Dispense Refill  . aspirin 81 MG tablet Take 81 mg by mouth daily.    Marland Kitchen doxycycline (ADOXA) 100 MG tablet Take 1 tablet by mouth 2 (two) times daily.  0  . metoprolol (LOPRESSOR) 50 MG tablet Take 1 tablet by mouth two  times daily 180 tablet 0  . Multiple Vitamin (MULTIVITAMIN) tablet Take 1 tablet by mouth daily.      . ranitidine (ZANTAC) 150 MG tablet Take 150 mg by mouth 2 (two) times daily.     . rosuvastatin (CRESTOR) 5 MG tablet Take 5 mg by mouth 3 (three) times a week.    . VESICARE 10 MG tablet Take 10 mg by mouth daily.     Marland Kitchen ZETIA 10 MG tablet Take 1 tablet by mouth  daily 90 tablet 1   No current facility-administered  medications for this visit.    Physical Exam: Filed Vitals:   09/02/15 1328  BP: 138/74  Pulse: 68  Height: 5\' 9"  (1.753 m)  Weight: 192 lb 9.6 oz (87.363 kg)    GEN- The patient is well appearing, alert and oriented x 3 today.   Head- normocephalic, atraumatic Eyes-  Sclera clear, conjunctiva pink Ears- hearing intact Oropharynx- clear without erythema Neck- supple no LAD Lungs- Clear to ausculation bilaterally, normal work of breathing Chest- pacemaker pocket is well healed Heart- Regular rate and rhythm, no murmurs, rubs or gallops, PMI not laterally displaced GI- soft, NT, ND, + BS Extremities- no clubbing, cyanosis, or edema, R leg support hose in place  Pacemaker interrogation- reviewed in detail today,  See PACEART report  Assessment and Plan:  1. Complete heart block Normal pacemaker function See Pace Art report MVP turned off and AV reduced from 280 to 200 msec  2. HTN Stable No change required today 2 gram sodium restriction advised  3. HL Lipids from 6/16 are reviewed with the patient today Repeat labs in June  4. conative impairment Reduce metoprolol to 25mg  BID as this could possibly contribute  5. OSA On cpap  6. CAD No  ischemic symptoms  [facility] Return to see Cecille Rubin in 6 months I will see in a year  [name], [facility] 09/02/2015 2:21 PM

## 2015-09-05 ENCOUNTER — Encounter: Payer: Medicare Other | Admitting: Internal Medicine

## 2015-09-09 DIAGNOSIS — D485 Neoplasm of uncertain behavior of skin: Secondary | ICD-10-CM | POA: Diagnosis not present

## 2015-09-09 DIAGNOSIS — L0101 Non-bullous impetigo: Secondary | ICD-10-CM | POA: Diagnosis not present

## 2015-09-22 ENCOUNTER — Other Ambulatory Visit: Payer: Self-pay | Admitting: Internal Medicine

## 2015-09-22 ENCOUNTER — Telehealth: Payer: Self-pay | Admitting: Neurology

## 2015-09-22 DIAGNOSIS — D485 Neoplasm of uncertain behavior of skin: Secondary | ICD-10-CM | POA: Diagnosis not present

## 2015-09-22 DIAGNOSIS — L98499 Non-pressure chronic ulcer of skin of other sites with unspecified severity: Secondary | ICD-10-CM | POA: Diagnosis not present

## 2015-10-20 ENCOUNTER — Ambulatory Visit (INDEPENDENT_AMBULATORY_CARE_PROVIDER_SITE_OTHER): Payer: Medicare Other | Admitting: Diagnostic Neuroimaging

## 2015-10-20 ENCOUNTER — Encounter: Payer: Self-pay | Admitting: Diagnostic Neuroimaging

## 2015-10-20 VITALS — BP 135/72 | HR 65 | Ht 69.0 in | Wt 191.0 lb

## 2015-10-20 DIAGNOSIS — G4733 Obstructive sleep apnea (adult) (pediatric): Secondary | ICD-10-CM | POA: Diagnosis not present

## 2015-10-20 DIAGNOSIS — G3184 Mild cognitive impairment, so stated: Secondary | ICD-10-CM | POA: Diagnosis not present

## 2015-10-20 DIAGNOSIS — I2510 Atherosclerotic heart disease of native coronary artery without angina pectoris: Secondary | ICD-10-CM

## 2015-10-20 DIAGNOSIS — R413 Other amnesia: Secondary | ICD-10-CM | POA: Diagnosis not present

## 2015-10-20 DIAGNOSIS — Z9989 Dependence on other enabling machines and devices: Secondary | ICD-10-CM

## 2015-10-20 NOTE — Progress Notes (Signed)
[facility]  PATIENT: Robert Ramsey DOB: [birth year]  REFERRING CLINICIAN: Baxley HISTORY FROM: patient and wife Robert Ramsey) REASON FOR VISIT: follow up    HISTORICAL  CHIEF COMPLAINT:  Chief Complaint  Patient presents with  . Memory Loss    rm 7, wifeJoaquim Ramsey, MMSE 25  . Follow-up    6 month    HISTORY OF PRESENT ILLNESS:   UPDATE 10/20/15: Since last visit, memory loss issues are stable. No new events. Able to do all ADLs.   UPDATE 04/21/15: Since last visit, memory loss symptoms are stable. Had neuropsych testing, showing dx of mild cognitive impairment (amnestic type). Overall stable. Also with sleep apnea diagnosis and now on CPAP since Apr 12, 2015, getting used to it but sometimes wakes up at night with nasal mask leak or mouth breathing.   PRIOR HPI (01/15/15): 75 year old right-handed male here for evaluation of memory loss. History of hypertension, hyperkalemia, coronary artery disease. Over past 2 months patient has had increasing problem with navigation while driving the car. He tends to take along with around to get from point A to point B. Even if this is a similar location. Over the past 6 months she is having to increase in the use shopping lists. Sometimes he has difficulty comprehending instructions from his wife. 2011 patient had some mild memory problems and had evaluation at that time. 2014 there is more progression. In the past 6-12 months more problems with short-term memory problems, recent conversations, saying the wrong name the streets, saying the wrong name of people including family members, word finding difficulties. Patient's wife gave an example of sending him to the store to purchase yellow cake mix and then patient having extreme difficulty finding the correct item in spite of 3 phone calls while he was at the grocery store.   REVIEW OF SYSTEMS: Full 14 system review of systems performed and negative except:   Review of Systems    Respiratory:       Apnea  Cardiovascular:       Hand swelling  Genitourinary: Positive for urgency and frequency.  Neurological:       Memory loss  Endo/Heme/Allergies: Bruises/bleeds easily.  Psychiatric/Behavioral:       Confusion  All other systems reviewed and are negative.    ALLERGIES: Allergies  Allergen Reactions  . Cinnamon     Cinnamon toothpaste caused him to break out inside his mouth and lesions were removed  . Flexeril [Cyclobenzaprine Hcl]     Possible heart side effects  . Imodium [Loperamide Hcl]     unknown  . Levofloxacin Other (See Comments)    Elevated heart rate  . Nabumetone     REACTION: Heart issues/hospital admission  . Naproxen     Effects blood pressure and heart  . Nsaids     Effects blood pressure and heart  . Vantin     Pseudo colitis  . Vioxx [Rofecoxib]     Effects blood pressure and heart    HOME MEDICATIONS: Outpatient Prescriptions Prior to Visit  Medication Sig Dispense Refill  . aspirin 81 MG tablet Take 81 mg by mouth daily.    . metoprolol (LOPRESSOR) 25 MG tablet Take 1 tablet (25 mg total) by mouth 2 (two) times daily. 180 tablet 3  . Multiple Vitamin (MULTIVITAMIN) tablet Take 1 tablet by mouth daily.      . ranitidine (ZANTAC) 150 MG tablet Take 150 mg by mouth 2 (two) times daily.     Marland Kitchen  rosuvastatin (CRESTOR) 5 MG tablet Take 5 mg by mouth 3 (three) times a week.    . VESICARE 10 MG tablet Take 10 mg by mouth daily.     Marland Kitchen ZETIA 10 MG tablet Take 1 tablet by mouth  daily 90 tablet 2  . doxycycline (ADOXA) 100 MG tablet Take 1 tablet by mouth 2 (two) times daily.  0   No facility-administered medications prior to visit.    PAST MEDICAL HISTORY: Past Medical History  Diagnosis Date  . HTN (hypertension)   . Complete heart block [facility]) May 2011    has PTVP in place  . Ventral hernia   . History of colonoscopy   . Memory impairment     better with lower dose statin  . PVC's (premature ventricular contractions)   .  Abnormal stress test 09/15/10    SEPTAL DEFECT SECONDARY TO PREVIOUS INFARCT VS LBBB; MILD SEPTAL ISCHEMIA  . CAD (coronary artery disease)     cath in 2007 reveals mild diffuse CAD  . Glucose intolerance (impaired glucose tolerance)   . Pacemaker     PAST SURGICAL HISTORY: Past Surgical History  Procedure Laterality Date  . Right nephrectomy  01/2006    partial for mass  . Cataract surgery  06/2009, 10/2014  . Appendectomy    . Pacemaker insertion  11/18/09    MDT implanted by Dr Doreatha Lew  . Cardiac catheterization  07/29/2005    EF 50-55%; Has mild diffuse CAD  . Lbbb      FAMILY HISTORY: Family History  Problem Relation Age of Onset  . Heart failure Father   . Leukemia Mother     SOCIAL HISTORY:  Social History   Social History  . Marital Status: Married    Spouse Name: Robert Ramsey  . Number of Children: 4  . Years of Education: 12   Occupational History  . Retired     [employer]   Social History Main Topics  . Smoking status: Former Smoker    Quit date: 07/06/1983  . Smokeless tobacco: Not on file     Comment: stopped smoking in 1985  . Alcohol Use: No  . Drug Use: No  . Sexual Activity: Not Currently   Other Topics Concern  . Not on file   Social History Narrative   Retired from Surveyor, [employer].   Lives at home with wife   Caffeine use- morning coffee and 2 cup/ 1 cup of tea at lunch      PHYSICAL EXAM  GENERAL EXAM/CONSTITUTIONAL: Vitals:  Filed Vitals:   10/20/15 1248  BP: 135/72  Pulse: 65  Height: 5\' 9"  (1.753 m)  Weight: 191 lb (86.637 kg)   Body mass index is 28.19 kg/(m^2). No exam data present  Patient is in no distress; well developed, nourished and groomed; neck is supple; MASKED FACIES  CARDIOVASCULAR:  Examination of carotid arteries is normal  Regular rate and rhythm, no murmurs  Examination of peripheral vascular system by observation and palpation is normal  EYES:  Ophthalmoscopic exam of optic discs and posterior  segments is normal; no papilledema or hemorrhages  MUSCULOSKELETAL:  Gait, strength, tone, movements noted in Neurologic exam below  NEUROLOGIC: MENTAL STATUS:  MMSE - Mini Mental State Exam 10/20/2015 04/21/2015 01/15/2015  Orientation to time 5 5 5   Orientation to Place 5 5 5   Registration 3 3 3   Attention/ Calculation 3 4 5   Recall 2 1 1   Language- name 2 objects 2 2 2   Language-  repeat 1 1 1   Language- follow 3 step command 3 3 3   Language- read & follow direction 1 1 1   Write a sentence 1 1 1   Copy design 1 1 1   Total score 27 27 28     awake, alert, oriented to person, place and time  recent and remote memory intact; EXCEPT 2/3 RECALL.  normal attention and concentration; EXCEPT 3/5 ATTENTION  language fluent, comprehension intact, naming intact,   fund of knowledge appropriate  CRANIAL NERVE:   2nd, 3rd, 4th, 6th - pupils equal and reactive to light, visual fields full to confrontation, extraocular muscles intact, no nystagmus  5th - facial sensation symmetric  7th - facial strength symmetric  8th - hearing intact  9th - palate elevates symmetrically, uvula midline  11th - shoulder shrug symmetric  12th - tongue protrusion midline  MOTOR:   normal bulk and tone; full strength in the BUE, BLE  SENSORY:   normal and symmetric to light touch  COORDINATION:   finger-nose-finger, fine finger movements normal  REFLEXES:   deep tendon reflexes present and symmetric; NO FRONTAL RELEASE SIGNS  GAIT/STATION:   narrow based gait; ABLE TO TANDEM WALK; romberg is negative    DIAGNOSTIC DATA (LABS, IMAGING, TESTING) - I reviewed patient records, labs, notes, testing and imaging myself where available.  Lab Results  Component Value Date   WBC 6.4 12/16/2014   HGB 15.2 12/16/2014   HCT 45.1 12/16/2014   MCV 95.1 12/16/2014   PLT 223 12/16/2014      Component Value Date/Time   NA 137 12/23/2014 0840   K 4.4 12/23/2014 0840   CL 108 12/23/2014  0840   CO2 24 12/23/2014 0840   GLUCOSE 101* 12/23/2014 0840   BUN 25* 12/23/2014 0840   CREATININE 1.07 12/23/2014 0840   CREATININE 0.96 12/16/2014 1252   CALCIUM 9.8 12/23/2014 0840   PROT 6.5 12/23/2014 0840   ALBUMIN 3.9 12/23/2014 0840   AST 22 12/23/2014 0840   ALT 18 12/23/2014 0840   ALKPHOS 51 12/23/2014 0840   BILITOT 0.3 12/23/2014 0840   Lab Results  Component Value Date   CHOL 112 12/23/2014   HDL 34.30* 12/23/2014   LDLCALC 53 12/23/2014   TRIG 126.0 12/23/2014   CHOLHDL 3 12/23/2014   Lab Results  Component Value Date   HGBA1C 5.8* 12/16/2014   Lab Results  Component Value Date   VITAMINB12 947* 12/16/2014   Lab Results  Component Value Date   TSH 2.242 12/16/2014    12/23/14 CT head [I reviewed images myself and agree with interpretation. -VRP]  - Atrophy. No acute findings.  09/02/15 PACEMAKER CHECK UP  - Pacemaker check in clinic. Normal device function. Thresholds, sensing, impedances consistent with previous measurements.   03/14/15 Neuropsychology testing - Calcifer Schweitzer is a 75 year-old man who according to his wife began to exhibit mild memory difficulties in 2011 that have become more pronounced within the past two years.  - Neuropsychological evaluation indicated prominent impairment of memory for acquisition, storage and retrieval. In addition, his ability to solve novel problems was deficient, primarily due to his apparent difficulty maintaining conceptual ideas within working memory. On the positive side, his non-memory cognitive functions were relatively intact. There were no indications or report of problems with mood, behavior or psychosocial adjustment.  - In conclusion, his neuropsychological profile, clinical history and current level of functioning were consistent with a diagnosis of Mild Cognitive Impairment-amnestic type. It is unlikely that his medical conditions (  e.g., sleep apnea) or any emotional/motivational factors would account for  his memory disorder. His prominent memory dysfunction would be concerning for a possible underlying neurodegenerative condition.    ASSESSMENT AND PLAN  75 y.o. year old male here with progressive short-term memory problems, navigation difficulty, confusion, over past 6-12 months. Mild symptoms started in 2011. Now with sleep apnea diagnosis and on CPAP since Oct 8. 2016.   Ddx: mild cognitive impairment vs pseudo-dementia (sleep apnea, depression)  Memory loss  MCI (mild cognitive impairment)  OSA on CPAP     PLAN: - continue CPAP - stay active; physical and mental stimulation - general health strategies and advanced care planning reviewed  Return in about 1 year (around 10/19/2016).    Penni Bombard, MD [id number], [id number] PM Certified in Neurology, Neurophysiology and Neuroimaging  [facility] [address] [place], [place] [address] [phone]

## 2015-10-20 NOTE — Patient Instructions (Signed)

## 2015-10-24 DIAGNOSIS — K210 Gastro-esophageal reflux disease with esophagitis: Secondary | ICD-10-CM | POA: Diagnosis not present

## 2015-10-24 DIAGNOSIS — Z86010 Personal history of colonic polyps: Secondary | ICD-10-CM | POA: Diagnosis not present

## 2015-11-05 ENCOUNTER — Telehealth: Payer: Self-pay | Admitting: Internal Medicine

## 2015-11-05 NOTE — Telephone Encounter (Signed)
New Message  Pt c/o swelling: STAT is pt has developed SOB within 24 hours  1. How long have you been experiencing swelling? 1 week or so   2. Where is the swelling located? Left hand   3.  Are you currently taking a "fluid pill"? No   4.  Are you currently SOB? No   5.  Have you traveled recently? NO   Comments:There is no definition in his left hand. He wakes up to a swollen hand and then slowly after lunch it un-swells. Please call back to discuss.

## 2015-11-05 NOTE — Telephone Encounter (Signed)
Returned call to patient and have suggested he call his PCP.  If they fill it is related to his heart can call for an appointment.

## 2015-11-06 ENCOUNTER — Encounter: Payer: Self-pay | Admitting: Internal Medicine

## 2015-11-06 ENCOUNTER — Ambulatory Visit (INDEPENDENT_AMBULATORY_CARE_PROVIDER_SITE_OTHER): Payer: Medicare Other | Admitting: Internal Medicine

## 2015-11-06 ENCOUNTER — Ambulatory Visit
Admission: RE | Admit: 2015-11-06 | Discharge: 2015-11-06 | Disposition: A | Discharge status: Home / Self Care | Payer: Medicare Other | Source: Ambulatory Visit | Attending: Internal Medicine | Admitting: Internal Medicine

## 2015-11-06 VITALS — BP 124/70 | HR 63 | Temp 97.1°F | Resp 18 | Wt 193.5 lb

## 2015-11-06 DIAGNOSIS — M7989 Other specified soft tissue disorders: Secondary | ICD-10-CM

## 2015-11-06 DIAGNOSIS — I2510 Atherosclerotic heart disease of native coronary artery without angina pectoris: Secondary | ICD-10-CM

## 2015-11-06 DIAGNOSIS — M19042 Primary osteoarthritis, left hand: Secondary | ICD-10-CM | POA: Diagnosis not present

## 2015-11-06 DIAGNOSIS — R609 Edema, unspecified: Secondary | ICD-10-CM | POA: Diagnosis not present

## 2015-11-06 LAB — CBC WITH DIFFERENTIAL/PLATELET
BASOS ABS: 0 {cells}/uL (ref 0–200)
BASOS PCT: 0 %
EOS ABS: 171 {cells}/uL (ref 15–500)
Eosinophils Relative: 3 %
HEMATOCRIT: 43.8 % (ref 38.5–50.0)
Hemoglobin: 14.8 g/dL (ref 13.2–17.1)
LYMPHS PCT: 24 %
Lymphs Abs: 1368 cells/uL (ref 850–3900)
MCH: 32.5 pg (ref 27.0–33.0)
MCHC: 33.8 g/dL (ref 32.0–36.0)
MCV: 96.3 fL (ref 80.0–100.0)
MONO ABS: 456 {cells}/uL (ref 200–950)
MONOS PCT: 8 %
MPV: 9.6 fL (ref 7.5–12.5)
NEUTROS PCT: 65 %
Neutro Abs: 3705 cells/uL (ref 1500–7800)
PLATELETS: 199 10*3/uL (ref 140–400)
RBC: 4.55 MIL/uL (ref 4.20–5.80)
RDW: 13.9 % (ref 11.0–15.0)
WBC: 5.7 10*3/uL (ref 3.8–10.8)

## 2015-11-06 LAB — COMPLETE METABOLIC PANEL WITH GFR
ALT: 14 U/L (ref 9–46)
AST: 17 U/L (ref 10–35)
Albumin: 4.2 g/dL (ref 3.6–5.1)
Alkaline Phosphatase: 49 U/L (ref 40–115)
BILIRUBIN TOTAL: 0.4 mg/dL (ref 0.2–1.2)
BUN: 20 mg/dL (ref 7–25)
CHLORIDE: 106 mmol/L (ref 98–110)
CO2: 27 mmol/L (ref 20–31)
CREATININE: 0.91 mg/dL (ref 0.70–1.18)
Calcium: 10.1 mg/dL (ref 8.6–10.3)
GFR, Est Non African American: 82 mL/min (ref >=60)
GLUCOSE: 77 mg/dL (ref 65–99)
Potassium: 4.6 mmol/L (ref 3.5–5.3)
Sodium: 140 mmol/L (ref 135–146)
TOTAL PROTEIN: 6.3 g/dL (ref 6.1–8.1)

## 2015-11-06 LAB — RHEUMATOID FACTOR: Rhuematoid fact SerPl-aCnc: <10 IU/mL (ref <=14)

## 2015-11-06 LAB — URIC ACID: Uric Acid, Serum: 6.7 mg/dL (ref 4.0–7.8)

## 2015-11-06 NOTE — Progress Notes (Signed)
   Subjective:    Patient ID: Robert Ramsey, male    DOB: 1940-07-21, 75 y.o.   MRN: [id number]  HPI 75 year old White Male with left hand swelling since late February or early March. He does sleep using the C Pap device. Doesn't think this is positional. Says hand is swollen every morning. Seems to get better later in the day. Today, it is not swollen. He also notes sometimes when driving he will have acute ulnar deviation of left fourth finger for no good reason. He does have some pain in finger and hand when that happens. Denies numbness or tingling in the fingers or hand. No recent injury. He is right handed so he has no repetitive motion with his left hand. He does have a pacemaker. He has mild cognitive impairment and sees neurologist. He was thinking this had something to do with pacemaker or his heart. He's not had significant lower extremity edema. No shortness of breath. His wife is a retired Marine scientist and is worried about this. He says it's been every morning consistently.  Denies excessive salt intake.    Review of Systems     Objective:   Physical Exam There is no edema in the left hand at this point in time. His radial pulses normal. His muscle strength in the hands is normal. He has Heberden's and Bouchard's nodes but no inflamed red joints. There is no axillary adenopathy. Deep tendon reflexes are 2+ and symmetrical in the left upper extremity. Phalen's  sign is negative.  Chest is clear. Cardiac exam regular rate and rhythm. Extremity without pitting edema       Assessment & Plan:  Edema of left hand with ulnar deviation left fourth finger intermittently  I'm not sure what is causing his symptoms. Do not think he has congestive heart failure. I do think he has osteoarthritis in his left hand.  Plan: We will to do a number of lab studies including rheumatoid factor, CCP, sedimentation rate, ANA. He will have x-ray of his left hand. BNP will be ordered. CBC and C-met  ordered.  His medications were reviewed. I don't think anything is medication related. He is not on diuretic and I see no reason to start one at this point in time. He may need to see hand surgeon. We'll see how lab work turns out.  Addendum: X-ray of left hand shows arthritis in fingers.

## 2015-11-06 NOTE — Patient Instructions (Signed)
Lab studies drawn and pending. Please have x-ray of left hand. Results will be called to you.

## 2015-11-07 LAB — ANA: ANA: NEGATIVE

## 2015-11-07 LAB — CYCLIC CITRUL PEPTIDE ANTIBODY, IGG

## 2015-11-07 LAB — BRAIN NATRIURETIC PEPTIDE: BRAIN NATRIURETIC PEPTIDE: 25.9 pg/mL (ref <100)

## 2015-11-07 LAB — SEDIMENTATION RATE: Sed Rate: 12 mm/hr (ref 0–20)

## 2015-11-07 NOTE — Addendum Note (Signed)
Addended by: Beryle Quant on: 11/07/2015 02:06 PM   Modules accepted: Orders

## 2015-11-24 ENCOUNTER — Ambulatory Visit: Payer: Medicare Other | Admitting: Neurology

## 2015-12-02 ENCOUNTER — Ambulatory Visit (INDEPENDENT_AMBULATORY_CARE_PROVIDER_SITE_OTHER): Payer: Medicare Other | Admitting: *Deleted

## 2015-12-02 DIAGNOSIS — I442 Atrioventricular block, complete: Secondary | ICD-10-CM

## 2015-12-02 NOTE — Progress Notes (Signed)
Remote pacemaker transmission.   

## 2015-12-10 ENCOUNTER — Encounter: Payer: Self-pay | Admitting: Neurology

## 2015-12-10 ENCOUNTER — Ambulatory Visit (INDEPENDENT_AMBULATORY_CARE_PROVIDER_SITE_OTHER): Payer: Medicare Other | Admitting: Neurology

## 2015-12-10 VITALS — BP 142/70 | HR 72 | Resp 16 | Ht 69.0 in | Wt 191.0 lb

## 2015-12-10 DIAGNOSIS — G4733 Obstructive sleep apnea (adult) (pediatric): Secondary | ICD-10-CM

## 2015-12-10 DIAGNOSIS — Z9989 Dependence on other enabling machines and devices: Principal | ICD-10-CM

## 2015-12-10 DIAGNOSIS — I2510 Atherosclerotic heart disease of native coronary artery without angina pectoris: Secondary | ICD-10-CM

## 2015-12-10 NOTE — Progress Notes (Signed)
Subjective:    Patient ID: Robert Ramsey is a 75 y.o. male.  HPI     Interim history:   Robert Ramsey is a very pleasant 75 year old right-handed gentleman with an underlying medical history of hypertension, complete heart block, hyperlipidemia, PVCs, coronary artery disease, impaired glucose tolerance, reflux disease, overweight state, memory loss (followed by Dr. Leta Baptist), status post cataract surgery, appendectomy, pacemaker placement and right nephrectomy, who presents for follow-up consultation of Robert Ramsey obstructive sleep apnea, after Robert Ramsey recent sleep studies. The patient is accompanied by Robert Ramsey wife again today. I last saw him on 05/27/2015, at which time we talked about Robert Ramsey sleep study results first baseline sleep study as well as Robert Ramsey CPAP titration study. Robert Ramsey was compliant with CPAP therapy. Robert Ramsey reported having adjusted well. Felt more rested, more motivated to do things and memory was stable. Overall Robert Ramsey felt improved and was motivated to continue with CPAP therapy. Robert Ramsey saw Dr. Leta Baptist on 10/20/2015 in the interim and I reviewed that note today.   Today, 12/10/2015: I reviewed Robert Ramsey CPAP compliance data from 11/09/2015 through 12/08/2015 which is a total of 30 days during which time Robert Ramsey used Robert Ramsey machine every day with percent used days greater than 4 hours at 100%, indicating superb compliance with an average usage of 8 hours and 7 minutes, residual AHI low at 0.2 per hour, leaked low with the 95th percentile at 6.8 L/m on a pressure of 9 cm with EPR of 3.   Today, 12/10/2015: Robert Ramsey reports doing well, memory stable, compliant with treatment.   Previously:   I first met him on 02/19/2015 at the request of Dr. Leta Baptist, at which time the patient reported snoring and excessive daytime somnolence as well as a prior diagnosis of obstructive sleep apnea in 1995. I invited him back for sleep study testing. Robert Ramsey had a baseline sleep study, followed by a CPAP titration study and I went over Robert Ramsey test results  with him in detail today. Robert Ramsey baseline sleep study from 03/04/2015 showed a sleep efficiency of 73.3%, latency to sleep was 21 minutes and wake after sleep onset was 99 minutes with mild to moderate sleep fragmentation noted. Robert Ramsey arousal index was 10.6 per hour. Robert Ramsey had an increased percentage of stage II sleep, absence of slow-wave sleep and low percentage of REM sleep at 4.9% with a very prolonged REM latency of 287.5 minutes. Robert Ramsey had no significant PLMS. Robert Ramsey had occasional PVCs and PACs on EKG. Robert Ramsey had intermittent mild snoring. Total AHI was borderline at 5.3 per hour, rising to 52.5 per hour during REM sleep and 10.6 per hour in the supine position. Average oxygen saturation was 93%, nadir was 80%. Time below 90% saturation was 21 minutes, time below 88% saturation was 9 minutes. Based on Robert Ramsey sleep related complaints, Robert Ramsey cognitive complaints, and Robert Ramsey medical history, I suggested Robert Ramsey return for a second sleep study for CPAP titration. Robert Ramsey had this on 04/01/2015. Sleep efficiency was 76.5%, sleep latency was 26.5 minutes and wake after sleep onset was 50.5 minutes with mild sleep fragmentation noted. Robert Ramsey had a normal arousal index. Robert Ramsey had an increased percentage of stage II sleep, absence of slow-wave sleep and REM sleep at 11.6% with a mildly prolonged REM latency of 138 minutes. Robert Ramsey had moderate PLMS with an index of 26.7 per hour, resulting in only 3.5 arousals per hour. Robert Ramsey had rare PVCs on EKG. Robert Ramsey had no significant snoring. CPAP was titrated from 5 cm to 9 cm. AHI was 0 per hour on  the final pressure with supine REM sleep achieved on the final pressure. Based on Robert Ramsey test results are prescribed CPAP therapy for home use.   I reviewed Robert Ramsey CPAP compliance data from 04/26/2015 through 05/25/2015 which is a total of 30 days during which time Robert Ramsey used Robert Ramsey machine every night with percent used days greater than 4 hours at 100%, indicating superb compliance with an average usage of 8 hours, residual AHI low at 0.3 per  hour, leak acceptable with the 95th percentile at 14.7 L/m on a pressure of 9 cm with EPR of 3.  02/19/2015: Robert Ramsey reports snoring and excessive daytime somnolence. I reviewed your office note from 01/15/2015. Robert Ramsey noticed some hearing loss and referred him for audiometry.   Robert Ramsey snoring has improved a little, worse on Robert Ramsey back, per wife. Robert Ramsey denies morning headaches and has occasional nocturia. Robert Ramsey denies RLS symptoms and does not tend to kicks Robert Ramsey legs in Robert Ramsey sleep. Bedtime is around MN and falling asleep is usually not a problem. Robert Ramsey does watch TV prior to going to bed but typically not while in bed. Robert Ramsey had sleep study testing in 1995 which per wife showed mild obstructive sleep apnea but not enough to use CPAP. CPAP was discussed at the time however. Robert Ramsey is scheduled for neuropsychological evaluation in September. Robert Ramsey has hearing testing pending for later this month and a follow-up with you in October. Robert Ramsey reports mild snoring. Very occasionally has woken himself up with a sense of gasping or snorting. Robert Ramsey wife has noticed occasional snorting sounds. Robert Ramsey denies any overt family history of obstructive sleep apnea. Robert Ramsey used to smoke a pipe but quit in 1987. Robert Ramsey drinks coffee, 2 cups each morning and occasional tea for lunch. Robert Ramsey does not drink any alcohol. Robert Ramsey had 3 brothers. One is alive , 1 died from a heart attack and the other brother died after a truck accident. Robert Ramsey main complaint is lack of energy during the day. Rise time is around 8 or 8:30. Robert Ramsey does not always wake up rested and used to take extended naps but Robert Ramsey cardiologist advised him to be more active during the day and less sedentary. Robert Ramsey does like to nap. Robert Ramsey Epworth sleepiness score is 8 out of 24 today, Robert Ramsey fatigue scores 51 out of 63.   Robert Ramsey Past Medical History Is Significant For: Past Medical History  Diagnosis Date  . HTN (hypertension)   . Complete heart block [facility]) May 2011    has PTVP in place  . Ventral hernia   . History of colonoscopy   .  Memory impairment     better with lower dose statin  . PVC's (premature ventricular contractions)   . Abnormal stress test 09/15/10    SEPTAL DEFECT SECONDARY TO PREVIOUS INFARCT VS LBBB; MILD SEPTAL ISCHEMIA  . CAD (coronary artery disease)     cath in 2007 reveals mild diffuse CAD  . Glucose intolerance (impaired glucose tolerance)   . Pacemaker     Robert Ramsey Past Surgical History Is Significant For: Past Surgical History  Procedure Laterality Date  . Right nephrectomy  01/2006    partial for mass  . Cataract surgery  06/2009, 10/2014  . Appendectomy    . Pacemaker insertion  11/18/09    MDT implanted by Dr Doreatha Lew  . Cardiac catheterization  07/29/2005    EF 50-55%; Has mild diffuse CAD  . Lbbb      Robert Ramsey Family History Is Significant For: Family History  Problem Relation Age of  Onset  . Heart failure Father   . Leukemia Mother     Robert Ramsey Social History Is Significant For: Social History   Social History  . Marital Status: Married    Spouse Name: Robert Ramsey  . Number of Children: 4  . Years of Education: 12   Occupational History  . Retired     [employer]   Social History Main Topics  . Smoking status: Former Smoker    Quit date: 07/06/1983  . Smokeless tobacco: None     Comment: stopped smoking in 1985  . Alcohol Use: No  . Drug Use: No  . Sexual Activity: Not Currently   Other Topics Concern  . None   Social History Narrative   Retired from [employer], [employer].   Lives at home with wife   Caffeine use- morning coffee and 2 cup/ 1 cup of tea at lunch     Robert Ramsey Allergies Are:  Allergies  Allergen Reactions  . Cinnamon     Cinnamon toothpaste caused him to break out inside Robert Ramsey mouth and lesions were removed  . Flexeril [Cyclobenzaprine Hcl]     Possible heart side effects  . Imodium [Loperamide Hcl]     unknown  . Levofloxacin Other (See Comments)    Elevated heart rate  . Nabumetone     REACTION: Heart issues/hospital admission  . Naproxen     Effects blood  pressure and heart  . Nsaids     Effects blood pressure and heart  . Vantin     Pseudo colitis  . Vioxx [Rofecoxib]     Effects blood pressure and heart  :   Robert Ramsey Current Medications Are:  Outpatient Encounter Prescriptions as of 12/10/2015  Medication Sig  . aspirin 81 MG tablet Take 81 mg by mouth daily.  . metoprolol (LOPRESSOR) 25 MG tablet Take 1 tablet (25 mg total) by mouth 2 (two) times daily.  . Multiple Vitamin (MULTIVITAMIN) tablet Take 1 tablet by mouth daily.    . ranitidine (ZANTAC) 150 MG tablet Take 150 mg by mouth 2 (two) times daily.   . rosuvastatin (CRESTOR) 5 MG tablet Take 5 mg by mouth 3 (three) times a week.  . VESICARE 10 MG tablet Take 10 mg by mouth daily.   Marland Kitchen ZETIA 10 MG tablet Take 1 tablet by mouth  daily   No facility-administered encounter medications on file as of 12/10/2015.  :  Review of Systems:  Out of a complete 14 point review of systems, all are reviewed and negative with the exception of these symptoms as listed below:   Review of Systems  Neurological:       Patient is here for f/u. States that Robert Ramsey is doing well on CPAP. No new concerns.     Objective:  Neurologic Exam  Physical Exam Physical Examination:   Filed Vitals:   12/10/15 1323  BP: 142/70  Pulse: 72  Resp: 16    General Examination: The patient is a very pleasant 75 y.o. male in no acute distress. Robert Ramsey appears well-developed and well-nourished and well groomed. Robert Ramsey is in good spirits today.  HEENT: Normocephalic, atraumatic, pupils are equal, round and reactive to light and accommodation. Funduscopic exam is normal with sharp disc margins noted. Robert Ramsey is status post bilateral cataract repairs. Extraocular tracking is good without limitation to gaze excursion or nystagmus noted. Normal smooth pursuit is noted. Hearing is grossly intact. Face is symmetric with normal facial animation and normal facial sensation. Speech is clear with  no dysarthria noted. There is no hypophonia. There  is no lip, neck/head, jaw or voice tremor. Neck is supple with full range of passive and active motion. There are no carotid bruits on auscultation. Oropharynx exam reveals: mild to moderate mouth dryness, adequate dental hygiene and mild airway crowding, due to redundant soft palate and thicker tongue. Mallampati is class II. Tongue protrudes centrally and palate elevates symmetrically. Tonsils are small in size. Robert Ramsey has a Mild overbite.   Chest: Clear to auscultation without wheezing, rhonchi or crackles noted.  Heart: S1+S2+0, regular and normal without murmurs, rubs or gallops noted.   Abdomen: Soft, non-tender and non-distended with normal bowel sounds appreciated on auscultation.  Extremities: There is trace pitting edema in the distal lower extremities bilaterally. Robert Ramsey has a compression stocking on the right, up to knee, unchanged. Pedal pulses are intact.  Skin: Warm and dry without trophic changes noted. There are no varicose veins.  Musculoskeletal: exam reveals no obvious joint deformities, tenderness or joint swelling or erythema.   Neurologically:  Mental status: The patient is awake, alert and oriented in all 4 spheres. Robert Ramsey immediate and remote memory, attention, language skills and fund of knowledge are fairly appropriate. There is no evidence of aphasia, agnosia, apraxia or anomia. Speech is clear with normal prosody and enunciation. Thought process is linear. Mood is normal and affect is normal.  Cranial nerves II - XII are as described above under HEENT exam. In addition: shoulder shrug is normal with equal shoulder height noted. Motor exam: Normal bulk, strength and tone is noted. There is no drift, tremor or rebound. Romberg is negative. Reflexes are 2+ throughout. Fine motor skills and coordination: intact.  Cerebellar testing: No dysmetria or intention tremor on finger to nose testing. Sensory exam: intact to light touch in the upper and lower extremities.  Gait, station and  balance: Robert Ramsey stands easily. No veering to one side is noted. No leaning to one side is noted. Posture is age-appropriate and stance is narrow based. Gait shows normal stride length and normal pace. No problems turning are noted. Robert Ramsey turns en bloc.     Assessment and Plan:   In summary, JUANANGEL SODERHOLM is a very pleasant 75 year old male with an underlying medical history of hypertension, complete heart block, hyperlipidemia, PVCs, coronary artery disease, impaired glucose tolerance, reflux disease, overweight state, memory loss, status post cataract surgery, appendectomy, pacemaker placement and right nephrectomy, who presents for follow-up consultation of Robert Ramsey overall mild obstructive sleep apnea, with significant REM predominance, after Robert Ramsey sleep studies in August 2016 and September 2016. Robert Ramsey has done well on CPAP therapy at a pressure of 9 cm with full compliance and good results reported. Today, we briefly talked about Robert Ramsey test results again and also discussed the most recent compliance data in detail. Robert Ramsey is congratulated on Robert Ramsey excellent treatment compliance. Robert Ramsey is encouraged to increase Robert Ramsey physical activity and stay well-hydrated. Robert Ramsey memory has been stable. Robert Ramsey had a checkup in that regard with Dr. Leta Baptist in April and was told to follow-up in one year. From my end of things Robert Ramsey has done rather well. I would like for him to follow-up in one year for sleep apnea checkup with me. I answered all their questions today and the patient and Robert Ramsey wife were in agreement. I spent 25 minutes in total face-to-face time with the patient, more than 50% of which was spent in counseling and coordination of care, reviewing test results, reviewing medication and discussing or reviewing the  diagnosis of OSA and memory loss, its prognosis and treatment options.

## 2015-12-10 NOTE — Patient Instructions (Addendum)

## 2015-12-16 LAB — CUP PACEART REMOTE DEVICE CHECK
Battery Remaining Longevity: 109 mo
Brady Statistic AP VS Percent: 0 %
Brady Statistic AS VP Percent: 84 %
Brady Statistic AS VS Percent: 0 %
Implantable Lead Implant Date: 20110517
Implantable Lead Location: 753859
Implantable Lead Serial Number: 672341
Lead Channel Impedance Value: 640 Ohm
Lead Channel Setting Pacing Amplitude: 2 V
Lead Channel Setting Pacing Amplitude: 2.5 V
Lead Channel Setting Pacing Pulse Width: 0.4 ms
MDC IDC LEAD IMPLANT DT: 20110517
MDC IDC LEAD LOCATION: 753860
MDC IDC LEAD SERIAL: 543103
MDC IDC MSMT BATTERY IMPEDANCE: 271 Ohm
MDC IDC MSMT BATTERY VOLTAGE: 2.79 V
MDC IDC MSMT LEADCHNL RV IMPEDANCE VALUE: 684 Ohm
MDC IDC SESS DTM: 20170530112002
MDC IDC SET LEADCHNL RV SENSING SENSITIVITY: 2 mV
MDC IDC STAT BRADY AP VP PERCENT: 15 %

## 2015-12-18 ENCOUNTER — Other Ambulatory Visit: Payer: Self-pay | Admitting: Internal Medicine

## 2015-12-19 ENCOUNTER — Other Ambulatory Visit: Payer: Self-pay | Admitting: *Deleted

## 2015-12-19 MED ORDER — ROSUVASTATIN CALCIUM 5 MG PO TABS: ORAL_TABLET | ORAL | Status: DC

## 2015-12-23 ENCOUNTER — Encounter: Payer: Self-pay | Admitting: Cardiology

## 2015-12-29 DIAGNOSIS — M79642 Pain in left hand: Secondary | ICD-10-CM | POA: Diagnosis not present

## 2015-12-29 DIAGNOSIS — M7989 Other specified soft tissue disorders: Secondary | ICD-10-CM | POA: Diagnosis not present

## 2016-01-29 DIAGNOSIS — N402 Nodular prostate without lower urinary tract symptoms: Secondary | ICD-10-CM | POA: Diagnosis not present

## 2016-02-09 NOTE — Telephone Encounter (Signed)
Closing encounter

## 2016-02-17 ENCOUNTER — Encounter: Payer: Self-pay | Admitting: Nurse Practitioner

## 2016-02-26 ENCOUNTER — Ambulatory Visit (INDEPENDENT_AMBULATORY_CARE_PROVIDER_SITE_OTHER): Payer: Medicare Other | Admitting: *Deleted

## 2016-02-26 DIAGNOSIS — I442 Atrioventricular block, complete: Secondary | ICD-10-CM | POA: Diagnosis not present

## 2016-02-26 NOTE — Progress Notes (Signed)
Remote pacemaker transmission.   

## 2016-02-27 ENCOUNTER — Other Ambulatory Visit: Payer: Medicare Other | Admitting: *Deleted

## 2016-02-27 DIAGNOSIS — I2510 Atherosclerotic heart disease of native coronary artery without angina pectoris: Secondary | ICD-10-CM

## 2016-02-27 LAB — HEPATIC FUNCTION PANEL
ALBUMIN: 3.9 g/dL (ref 3.6–5.1)
ALK PHOS: 50 U/L (ref 40–115)
ALT: 13 U/L (ref 9–46)
AST: 14 U/L (ref 10–35)
BILIRUBIN TOTAL: 0.4 mg/dL (ref 0.2–1.2)
Bilirubin, Direct: 0.1 mg/dL (ref <=0.2)
Indirect Bilirubin: 0.3 mg/dL (ref 0.2–1.2)
Total Protein: 6 g/dL — ABNORMAL LOW (ref 6.1–8.1)

## 2016-02-27 LAB — LIPID PANEL
CHOL/HDL RATIO: 2.9 ratio (ref <=5.0)
Cholesterol: 114 mg/dL — ABNORMAL LOW (ref 125–200)
HDL: 40 mg/dL (ref >=40)
LDL CALC: 52 mg/dL (ref <130)
Triglycerides: 109 mg/dL (ref <150)
VLDL: 22 mg/dL (ref <30)

## 2016-03-02 ENCOUNTER — Ambulatory Visit (INDEPENDENT_AMBULATORY_CARE_PROVIDER_SITE_OTHER): Payer: Medicare Other | Admitting: Nurse Practitioner

## 2016-03-02 ENCOUNTER — Encounter: Payer: Self-pay | Admitting: Nurse Practitioner

## 2016-03-02 ENCOUNTER — Ambulatory Visit: Payer: Medicare Other

## 2016-03-02 VITALS — BP 138/88 | HR 66 | Ht 69.0 in | Wt 191.0 lb

## 2016-03-02 DIAGNOSIS — Z950 Presence of cardiac pacemaker: Secondary | ICD-10-CM | POA: Diagnosis not present

## 2016-03-02 DIAGNOSIS — I2510 Atherosclerotic heart disease of native coronary artery without angina pectoris: Secondary | ICD-10-CM | POA: Diagnosis not present

## 2016-03-02 DIAGNOSIS — E785 Hyperlipidemia, unspecified: Secondary | ICD-10-CM

## 2016-03-02 DIAGNOSIS — I10 Essential (primary) hypertension: Secondary | ICD-10-CM

## 2016-03-02 NOTE — Progress Notes (Signed)
CARDIOLOGY OFFICE NOTE  Date:  03/02/2016    Robert Ramsey Date of Birth: 19-Aug-1940 Medical Record [id number]  PCP:  Robert Showers, MD  Cardiologist:  Robert Ramsey & Robert Ramsey  Chief Complaint  Patient presents with  . Coronary Artery Disease  . Hyperlipidemia  . Hypertension    6 month check - seen for Robert Ramsey    History of Present Illness: Robert Ramsey is a 75 y.o. male who presents today for a 6 month check. Seen for Robert Ramsey. He is a former patient of Robert Ramsey.   He has mild CAD, HTN, HLD and pacemaker in place for Mobitz II AV block. Tolerates only low dose statin due to memory issues. He has had remote cath from 2007 showing mild diffuse CAD. He has had prior right partial nephrectomy for a renal mass by Robert Ramsey back in 2012. He has had some progressive memory issues.   Last seen in Feburary by Robert Ramsey - was doing ok.   Comes in today. Here with his wife. Says he is doing "great". Walking some. Recent remote pacemaker check ok with no episodes. BP diary from home is fine. On less Metoprolol. Worried about some swelling in the left hand - most likely getting way too much salt - they eat out several times a week - pizza, [place], etc. Saw PCP and saw Robert Ramsey about this as well - thought it was more related to arthritis. No chest pain. Breathing is fine. Wearing his support stockings. He feels like he is doing fine.   Past Medical History:  Diagnosis Date  . Abnormal stress test 09/15/10   SEPTAL DEFECT SECONDARY TO PREVIOUS INFARCT VS LBBB; MILD SEPTAL ISCHEMIA  . CAD (coronary artery disease)    cath in 2007 reveals mild diffuse CAD  . Complete heart block [facility]) May 2011   has PTVP in place  . Glucose intolerance (impaired glucose tolerance)   . History of colonoscopy   . HTN (hypertension)   . Memory impairment    better with lower dose statin  . Pacemaker   . PVC's (premature ventricular contractions)   . Ventral hernia     Past Surgical  History:  Procedure Laterality Date  . APPENDECTOMY    . CARDIAC CATHETERIZATION  07/29/2005   EF 50-55%; Has mild diffuse CAD  . cataract surgery  06/2009, 10/2014  . LBBB    . PACEMAKER INSERTION  11/18/09   MDT implanted by Dr Doreatha Lew  . right nephrectomy  01/2006   partial for mass     Medications: Current Outpatient Prescriptions  Medication Sig Dispense Refill  . aspirin 81 MG tablet Take 81 mg by mouth daily.    . metoprolol (LOPRESSOR) 25 MG tablet Take 1 tablet (25 mg total) by mouth 2 (two) times daily. 180 tablet 3  . Multiple Vitamin (MULTIVITAMIN) tablet Take 1 tablet by mouth daily.      . ranitidine (ZANTAC) 150 MG tablet Take 150 mg by mouth 2 (two) times daily.     . rosuvastatin (CRESTOR) 5 MG tablet Take 5 mg by mouth 3 (three) times a week.    . VESICARE 10 MG tablet Take 10 mg by mouth daily.     Marland Kitchen ZETIA 10 MG tablet Take 1 tablet by mouth  daily 90 tablet 2   No current facility-administered medications for this visit.     Allergies: Allergies  Allergen Reactions  . Cinnamon     Cinnamon toothpaste  caused him to break out inside his mouth and lesions were removed  . Flexeril [Cyclobenzaprine Hcl]     Possible heart side effects  . Imodium [Loperamide Hcl]     unknown  . Levofloxacin Other (See Comments)    Elevated heart rate  . Nabumetone     REACTION: Heart issues/hospital admission  . Naproxen     Effects blood pressure and heart  . Nsaids     Effects blood pressure and heart  . Vantin     Pseudo colitis  . Vioxx [Rofecoxib]     Effects blood pressure and heart    Social History: The patient  reports that he quit smoking about 32 years ago. He has never used smokeless tobacco. He reports that he does not drink alcohol or use drugs.   Family History: The patient's family history includes Heart failure in his father; Leukemia in his mother.   Review of Systems: Please see the history of present illness.   Otherwise, the review of systems  is positive for none.   All other systems are reviewed and negative.   Physical Exam: VS:  BP 138/88   Pulse 66   Ht 5\' 9"  (1.753 m)   Wt 191 lb (86.6 kg)   BMI 28.21 kg/m  .  BMI Body mass index is 28.21 kg/m.  Wt Readings from Last 3 Encounters:  03/02/16 191 lb (86.6 kg)  12/10/15 191 lb (86.6 kg)  11/06/15 193 lb 8 oz (87.8 kg)    General: Pleasant. His affect is not as flat today. He is alert and in no acute distress.   HEENT: Normal.  Neck: Supple, no JVD, carotid bruits, or masses noted.  Cardiac: Regular rate and rhythm. No murmurs, rubs, or gallops. No edema.  Respiratory:  Lungs are clear to auscultation bilaterally with normal work of breathing.  GI: Soft and nontender.  MS: No deformity or atrophy. Gait and ROM intact.  Skin: Warm and dry. Color is normal.  Neuro:  Strength and sensation are intact and no gross focal deficits noted.  Psych: Alert, appropriate and with normal affect.   LABORATORY DATA:  EKG:  EKG is ordered today. This demonstrates a paced rhythm today.  Lab Results  Component Value Date   WBC 5.7 11/06/2015   HGB 14.8 11/06/2015   HCT 43.8 11/06/2015   PLT 199 11/06/2015   GLUCOSE 77 11/06/2015   CHOL 114 (L) 02/27/2016   TRIG 109 02/27/2016   HDL 40 02/27/2016   LDLCALC 52 02/27/2016   ALT 13 02/27/2016   AST 14 02/27/2016   NA 140 11/06/2015   K 4.6 11/06/2015   CL 106 11/06/2015   CREATININE 0.91 11/06/2015   BUN 20 11/06/2015   CO2 27 11/06/2015   TSH 2.242 12/16/2014   HGBA1C 5.8 (H) 12/16/2014    BNP (last 3 results)  Recent Labs  11/06/15 1051  BNP 25.9    ProBNP (last 3 results) No results for input(s): PROBNP in the last 8760 hours.   Other Studies Reviewed Today:   Assessment/Plan: 1. CAD - mild/diffuse disease per remote cath - no symptoms clinically. Would continue with CV risk factor modification.   2. HTN - BP ok on current regimen  3. HLD - tolerates only low dose statin therapy - recent labs  noted.  I gave him a copy of his labs.   4. PPM - followed by Robert Ramsey  5. Past renal mass - followed by GU   6.  Progressive memory issues -  Followed by neurology yearly.   7. Left hand swelling - would encourage salt restriction. Offered to get an echo but he has no other symptoms and would favor salt restriction regardless.   Current medicines are reviewed with the patient today.  The patient does not have concerns regarding medicines other than what has been noted above.  The following changes have been made:  See above.  Labs/ tests ordered today include:   No orders of the defined types were placed in this encounter.    Disposition:   FU with Robert Ramsey in 6 months.   Patient is agreeable to this plan and will call if any problems develop in the interim.   Signed: [name], RN, ANP-C 03/02/2016 10:48 AM  [facility] [address] [place],   [address] Phone: [phone] Fax: [phone]

## 2016-03-02 NOTE — Patient Instructions (Addendum)
We will be checking the following labs today - NONE   Medication Instructions:    Continue with your current medicines.     Testing/Procedures To Be Arranged:  N/A  Follow-Up:   See Dr. Rayann Heman    Other Special Instructions:   Need to really restrict your use of salt.     If you need a refill on your cardiac medications before your next appointment, please call your pharmacy.   Call the [place] office at [phone] if you have any questions, problems or concerns.

## 2016-03-04 ENCOUNTER — Encounter: Payer: Self-pay | Admitting: Cardiology

## 2016-03-10 LAB — CUP PACEART REMOTE DEVICE CHECK
Battery Voltage: 2.79 V
Brady Statistic AP VS Percent: 0 %
Brady Statistic AS VS Percent: 0 %
Date Time Interrogation Session: 20170824103248
Implantable Lead Implant Date: 20110517
Implantable Lead Model: 4469
Lead Channel Pacing Threshold Pulse Width: 0.4 ms
Lead Channel Setting Pacing Amplitude: 2 V
Lead Channel Setting Pacing Amplitude: 2.5 V
Lead Channel Setting Sensing Sensitivity: 2 mV
MDC IDC LEAD IMPLANT DT: 20110517
MDC IDC LEAD LOCATION: 753859
MDC IDC LEAD LOCATION: 753860
MDC IDC LEAD SERIAL: 543103
MDC IDC LEAD SERIAL: 672341
MDC IDC MSMT BATTERY IMPEDANCE: 295 Ohm
MDC IDC MSMT BATTERY REMAINING LONGEVITY: 107 mo
MDC IDC MSMT LEADCHNL RA IMPEDANCE VALUE: 699 Ohm
MDC IDC MSMT LEADCHNL RA PACING THRESHOLD AMPLITUDE: 0.375 V
MDC IDC MSMT LEADCHNL RA PACING THRESHOLD PULSEWIDTH: 0.4 ms
MDC IDC MSMT LEADCHNL RA SENSING INTR AMPL: 2.8 mV
MDC IDC MSMT LEADCHNL RV IMPEDANCE VALUE: 734 Ohm
MDC IDC MSMT LEADCHNL RV PACING THRESHOLD AMPLITUDE: 0.5 V
MDC IDC SET LEADCHNL RV PACING PULSEWIDTH: 0.4 ms
MDC IDC STAT BRADY AP VP PERCENT: 18 %
MDC IDC STAT BRADY AS VP PERCENT: 82 %

## 2016-03-19 DIAGNOSIS — Z961 Presence of intraocular lens: Secondary | ICD-10-CM | POA: Diagnosis not present

## 2016-03-19 DIAGNOSIS — H5000 Unspecified esotropia: Secondary | ICD-10-CM | POA: Diagnosis not present

## 2016-03-19 DIAGNOSIS — H26493 Other secondary cataract, bilateral: Secondary | ICD-10-CM | POA: Diagnosis not present

## 2016-03-19 DIAGNOSIS — H04123 Dry eye syndrome of bilateral lacrimal glands: Secondary | ICD-10-CM | POA: Diagnosis not present

## 2016-03-25 ENCOUNTER — Ambulatory Visit (INDEPENDENT_AMBULATORY_CARE_PROVIDER_SITE_OTHER): Payer: Medicare Other | Admitting: Internal Medicine

## 2016-03-25 VITALS — Temp 97.4°F

## 2016-03-25 DIAGNOSIS — Z23 Encounter for immunization: Secondary | ICD-10-CM

## 2016-04-08 DIAGNOSIS — L820 Inflamed seborrheic keratosis: Secondary | ICD-10-CM | POA: Diagnosis not present

## 2016-04-08 DIAGNOSIS — D692 Other nonthrombocytopenic purpura: Secondary | ICD-10-CM | POA: Diagnosis not present

## 2016-04-08 DIAGNOSIS — D1801 Hemangioma of skin and subcutaneous tissue: Secondary | ICD-10-CM | POA: Diagnosis not present

## 2016-04-08 DIAGNOSIS — L821 Other seborrheic keratosis: Secondary | ICD-10-CM | POA: Diagnosis not present

## 2016-04-08 DIAGNOSIS — H61001 Unspecified perichondritis of right external ear: Secondary | ICD-10-CM | POA: Diagnosis not present

## 2016-04-08 DIAGNOSIS — L570 Actinic keratosis: Secondary | ICD-10-CM | POA: Diagnosis not present

## 2016-05-06 DIAGNOSIS — H26491 Other secondary cataract, right eye: Secondary | ICD-10-CM | POA: Diagnosis not present

## 2016-05-20 DIAGNOSIS — H26492 Other secondary cataract, left eye: Secondary | ICD-10-CM | POA: Diagnosis not present

## 2016-06-02 ENCOUNTER — Ambulatory Visit (INDEPENDENT_AMBULATORY_CARE_PROVIDER_SITE_OTHER): Payer: Medicare Other | Admitting: *Deleted

## 2016-06-02 DIAGNOSIS — I442 Atrioventricular block, complete: Secondary | ICD-10-CM

## 2016-06-02 NOTE — Progress Notes (Signed)
Remote pacemaker transmission.   

## 2016-06-11 ENCOUNTER — Encounter: Payer: Self-pay | Admitting: Cardiology

## 2016-07-06 LAB — CUP PACEART REMOTE DEVICE CHECK
Battery Remaining Longevity: 103 mo
Battery Voltage: 2.79 V
Implantable Lead Implant Date: 20110517
Implantable Lead Location: 753860
Implantable Lead Serial Number: 543103
Implantable Pulse Generator Implant Date: 20110517
Lead Channel Pacing Threshold Amplitude: 0.5 V
Lead Channel Pacing Threshold Pulse Width: 0.4 ms
Lead Channel Setting Pacing Amplitude: 2 V
Lead Channel Setting Pacing Pulse Width: 0.4 ms
Lead Channel Setting Sensing Sensitivity: 2 mV
MDC IDC LEAD IMPLANT DT: 20110517
MDC IDC LEAD LOCATION: 753859
MDC IDC LEAD SERIAL: 672341
MDC IDC MSMT BATTERY IMPEDANCE: 319 Ohm
MDC IDC MSMT LEADCHNL RA IMPEDANCE VALUE: 651 Ohm
MDC IDC MSMT LEADCHNL RA PACING THRESHOLD AMPLITUDE: 0.375 V
MDC IDC MSMT LEADCHNL RA PACING THRESHOLD PULSEWIDTH: 0.4 ms
MDC IDC MSMT LEADCHNL RV IMPEDANCE VALUE: 682 Ohm
MDC IDC SESS DTM: 20171129133330
MDC IDC SET LEADCHNL RV PACING AMPLITUDE: 2.5 V
MDC IDC STAT BRADY AP VP PERCENT: 18 %
MDC IDC STAT BRADY AP VS PERCENT: 0 %
MDC IDC STAT BRADY AS VP PERCENT: 82 %
MDC IDC STAT BRADY AS VS PERCENT: 0 %

## 2016-07-09 ENCOUNTER — Other Ambulatory Visit: Payer: Self-pay | Admitting: Internal Medicine

## 2016-07-09 NOTE — Telephone Encounter (Signed)
Rx refill sent to pharmacy. 

## 2016-08-11 ENCOUNTER — Telehealth: Payer: Self-pay | Admitting: Internal Medicine

## 2016-08-11 ENCOUNTER — Ambulatory Visit (INDEPENDENT_AMBULATORY_CARE_PROVIDER_SITE_OTHER): Payer: Medicare Other | Admitting: Internal Medicine

## 2016-08-11 ENCOUNTER — Encounter: Payer: Self-pay | Admitting: Internal Medicine

## 2016-08-11 ENCOUNTER — Encounter (INDEPENDENT_AMBULATORY_CARE_PROVIDER_SITE_OTHER): Payer: Self-pay

## 2016-08-11 VITALS — BP 146/78 | HR 67 | Ht 69.0 in | Wt 186.2 lb

## 2016-08-11 DIAGNOSIS — I442 Atrioventricular block, complete: Secondary | ICD-10-CM

## 2016-08-11 DIAGNOSIS — E7800 Pure hypercholesterolemia, unspecified: Secondary | ICD-10-CM

## 2016-08-11 DIAGNOSIS — Z950 Presence of cardiac pacemaker: Secondary | ICD-10-CM

## 2016-08-11 DIAGNOSIS — I10 Essential (primary) hypertension: Secondary | ICD-10-CM | POA: Diagnosis not present

## 2016-08-11 MED ORDER — LOSARTAN POTASSIUM 25 MG PO TABS: 25.0000 mg | ORAL_TABLET | Freq: Every day | ORAL | 3 refills | Status: DC

## 2016-08-11 NOTE — Patient Instructions (Signed)
Medication Instructions:  Your physician has recommended you make the following change in your medication:  1) Stop Metoprolol 2) Start Losartan 25 mg daily   Labwork: Your physician recommends that you return for lab work fasting: BMP/Liver/Lipid   Testing/Procedures: None ordered   Follow-Up: Remote monitoring is used to monitor your Pacemaker from home. This monitoring reduces the number of office visits required to check your device to one time per year. It allows [place] to keep an eye on the functioning of your device to ensure it is working properly. You are scheduled for a device check from home on 11/10/16. You may send your transmission at any time that day. If you have a wireless device, the transmission will be sent automatically. After your physician reviews your transmission, you will receive a postcard with your next transmission date.   Your physician wants you to follow-up in: 6 months with Robert Merle, NP and 12 months with Dr Robert Ramsey will receive a reminder letter in the mail two months in advance. If you don't receive a letter, please call our office to schedule the follow-up appointment.    Any Other Special Instructions Will Be Listed Below (If Applicable).     If you need a refill on your cardiac medications before your next appointment, please call your pharmacy.

## 2016-08-11 NOTE — Telephone Encounter (Signed)
Returned call to patient and let him know he was on a small dose of medication it is okay to stop as directed by Dr Rayann Heman

## 2016-08-11 NOTE — Telephone Encounter (Signed)
New Message  Pt c/o medication issue:  1. Name of Medication:  Metoprolol-not listed losartan (Cozaar) 25 mg tablet once daily  2. How are you currently taking this medication (dosage and times per day)? See above  3. Are you having a reaction (difficulty breathing--STAT)? N/A  4. What is your medication issue? Pts wife voiced pharmacist stated do not stop all of a sudden due to it could cause a heart attack.  Pts wife is concerned and would like for the nurse or MD to call.  Please f/u

## 2016-08-11 NOTE — Progress Notes (Signed)
PCP: Elby Showers, MD  Robert Ramsey is a 76 y.o. male who presents today for routine electrophysiology followup.  Since last being seen in our clinic, the patient reports doing very well.   Today, he denies symptoms of palpitations, chest pain, shortness of breath,  lower extremity edema, dizziness, presyncope, or syncope.  The patient is otherwise without complaint today.   Past Medical History:  Diagnosis Date  . Abnormal stress test 09/15/10   SEPTAL DEFECT SECONDARY TO PREVIOUS INFARCT VS LBBB; MILD SEPTAL ISCHEMIA  . CAD (coronary artery disease)    cath in 2007 reveals mild diffuse CAD  . Complete heart block [facility]) May 2011   has PTVP in place  . Glucose intolerance (impaired glucose tolerance)   . History of colonoscopy   . HTN (hypertension)   . Memory impairment    better with lower dose statin  . Pacemaker   . PVC's (premature ventricular contractions)   . Ventral hernia    Past Surgical History:  Procedure Laterality Date  . APPENDECTOMY    . CARDIAC CATHETERIZATION  07/29/2005   EF 50-55%; Has mild diffuse CAD  . cataract surgery  06/2009, 10/2014  . LBBB    . PACEMAKER INSERTION  11/18/09   MDT implanted by Dr Doreatha Lew  . right nephrectomy  01/2006   partial for mass    Current Outpatient Prescriptions  Medication Sig Dispense Refill  . aspirin 81 MG tablet Take 81 mg by mouth daily.    Marland Kitchen ezetimibe (ZETIA) 10 MG tablet TAKE 1 TABLET BY MOUTH  DAILY 90 tablet 3  . metoprolol (LOPRESSOR) 25 MG tablet Take 1 tablet (25 mg total) by mouth 2 (two) times daily. 180 tablet 3  . Multiple Vitamin (MULTIVITAMIN) tablet Take 1 tablet by mouth daily.      . ranitidine (ZANTAC) 150 MG tablet Take 150 mg by mouth 2 (two) times daily.     . rosuvastatin (CRESTOR) 5 MG tablet Take 2.5 mg by mouth 3 (three) times a week.     . VESICARE 10 MG tablet Take 10 mg by mouth daily.      No current facility-administered medications for this visit.     Physical Exam: Vitals:   08/11/16 1134  BP: (!) 146/78  Pulse: 67  Weight: 186 lb 3.2 oz (84.5 kg)  Height: 5\' 9"  (1.753 m)    GEN- The patient is well appearing, alert and oriented x 3 today.   Head- normocephalic, atraumatic Eyes-  Sclera clear, conjunctiva pink Ears- hearing intact Oropharynx- clear without erythema Neck- supple no LAD Lungs- Clear to ausculation bilaterally, normal work of breathing Chest- pacemaker pocket is well healed Heart- Regular rate and rhythm, no murmurs, rubs or gallops, PMI not laterally displaced GI- soft, NT, ND, + BS Extremities- no clubbing, cyanosis, or edema, R leg support hose in place  Pacemaker interrogation- reviewed in detail today,  See PACEART report  Assessment and Plan:  1. Complete heart block Normal pacemaker function See Pace Art report  2. HTN Stable Stop metoprolol Add losartan bmet 2 gram sodium restriction advised  3. HL Check lipids/ LFTs No change required today  4. conative impairment Stop metoprolol as this could possibly contribute  5. OSA On cpap  6. CAD No ischemic symptoms  [facility] Return to see Cecille Rubin in 6 months I will see in a year  [name], [facility] 08/11/2016 12:18 PM

## 2016-08-12 LAB — CUP PACEART INCLINIC DEVICE CHECK
Battery Impedance: 319 Ohm
Battery Remaining Longevity: 104 mo
Battery Voltage: 2.79 V
Brady Statistic AP VP Percent: 18 %
Implantable Lead Implant Date: 20110517
Implantable Lead Location: 753859
Implantable Lead Model: 4469
Implantable Lead Model: 4470
Implantable Pulse Generator Implant Date: 20110517
Lead Channel Impedance Value: 672 Ohm
Lead Channel Impedance Value: 716 Ohm
Lead Channel Sensing Intrinsic Amplitude: 4 mV
Lead Channel Setting Pacing Amplitude: 2 V
Lead Channel Setting Pacing Amplitude: 2.5 V
Lead Channel Setting Pacing Pulse Width: 0.4 ms
MDC IDC LEAD IMPLANT DT: 20110517
MDC IDC LEAD LOCATION: 753860
MDC IDC LEAD SERIAL: 543103
MDC IDC LEAD SERIAL: 672341
MDC IDC MSMT LEADCHNL RA PACING THRESHOLD AMPLITUDE: 0.5 V
MDC IDC MSMT LEADCHNL RA PACING THRESHOLD PULSEWIDTH: 0.4 ms
MDC IDC MSMT LEADCHNL RV PACING THRESHOLD AMPLITUDE: 0.5 V
MDC IDC MSMT LEADCHNL RV PACING THRESHOLD PULSEWIDTH: 0.4 ms
MDC IDC SESS DTM: 20180207180814
MDC IDC SET LEADCHNL RV SENSING SENSITIVITY: 2 mV
MDC IDC STAT BRADY AP VS PERCENT: 0 %
MDC IDC STAT BRADY AS VP PERCENT: 82 %
MDC IDC STAT BRADY AS VS PERCENT: 0 %

## 2016-08-18 ENCOUNTER — Other Ambulatory Visit: Payer: Medicare Other | Admitting: *Deleted

## 2016-08-18 DIAGNOSIS — I10 Essential (primary) hypertension: Secondary | ICD-10-CM

## 2016-08-18 LAB — BASIC METABOLIC PANEL
BUN / CREAT RATIO: 18 (ref 10–24)
BUN: 16 mg/dL (ref 8–27)
CO2: 20 mmol/L (ref 18–29)
CREATININE: 0.88 mg/dL (ref 0.76–1.27)
Calcium: 9.3 mg/dL (ref 8.6–10.2)
Chloride: 103 mmol/L (ref 96–106)
GFR calc Af Amer: 96 mL/min/{1.73_m2} (ref >59)
GFR, EST NON AFRICAN AMERICAN: 83 mL/min/{1.73_m2} (ref >59)
Glucose: 103 mg/dL — ABNORMAL HIGH (ref 65–99)
POTASSIUM: 4.4 mmol/L (ref 3.5–5.2)
SODIUM: 141 mmol/L (ref 134–144)

## 2016-08-18 LAB — HEPATIC FUNCTION PANEL
ALBUMIN: 4 g/dL (ref 3.5–4.8)
ALK PHOS: 53 IU/L (ref 39–117)
ALT: 15 IU/L (ref 0–44)
AST: 17 IU/L (ref 0–40)
Bilirubin Total: 0.5 mg/dL (ref 0.0–1.2)
Bilirubin, Direct: 0.16 mg/dL (ref 0.00–0.40)
Total Protein: 6.1 g/dL (ref 6.0–8.5)

## 2016-09-16 DIAGNOSIS — L308 Other specified dermatitis: Secondary | ICD-10-CM | POA: Diagnosis not present

## 2016-10-19 ENCOUNTER — Ambulatory Visit: Payer: Medicare Other | Admitting: Diagnostic Neuroimaging

## 2016-10-19 ENCOUNTER — Other Ambulatory Visit: Payer: Self-pay | Admitting: *Deleted

## 2016-10-19 MED ORDER — LOSARTAN POTASSIUM 25 MG PO TABS: 25.0000 mg | ORAL_TABLET | Freq: Every day | ORAL | 2 refills | Status: DC

## 2016-10-20 ENCOUNTER — Encounter: Payer: Self-pay | Admitting: Diagnostic Neuroimaging

## 2016-10-21 ENCOUNTER — Telehealth: Payer: Self-pay | Admitting: *Deleted

## 2016-10-21 NOTE — Telephone Encounter (Signed)
Spoke to pt.  He was doing ok.  Would like to be seen sooner then July appt that is scheduled.   I told him that possibility that Dr. Leta Baptist may open up some days.  Will call him back when this happens/

## 2016-10-21 NOTE — Telephone Encounter (Signed)
LMVM for pt that returned call.  Appt for 02/01/17 Dr. Gladstone Lighter next available.  Wanted to see how you were doing, possibly see NP?  I'll wait to hear from you.

## 2016-10-26 DIAGNOSIS — K210 Gastro-esophageal reflux disease with esophagitis: Secondary | ICD-10-CM | POA: Diagnosis not present

## 2016-10-26 DIAGNOSIS — Z86010 Personal history of colonic polyps: Secondary | ICD-10-CM | POA: Diagnosis not present

## 2016-10-26 DIAGNOSIS — K5901 Slow transit constipation: Secondary | ICD-10-CM | POA: Diagnosis not present
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
## 2016-11-10 ENCOUNTER — Ambulatory Visit (INDEPENDENT_AMBULATORY_CARE_PROVIDER_SITE_OTHER): Payer: Medicare Other | Admitting: *Deleted

## 2016-11-10 DIAGNOSIS — I442 Atrioventricular block, complete: Secondary | ICD-10-CM

## 2016-11-10 NOTE — Progress Notes (Signed)
Remote pacemaker transmission.   

## 2016-11-12 ENCOUNTER — Encounter: Payer: Self-pay | Admitting: Cardiology

## 2016-11-12 LAB — CUP PACEART REMOTE DEVICE CHECK
Battery Remaining Longevity: 98 mo
Battery Voltage: 2.79 V
Brady Statistic AP VP Percent: 4 %
Brady Statistic AP VS Percent: 0 %
Brady Statistic AS VP Percent: 95 %
Date Time Interrogation Session: 20180509121848
Implantable Lead Implant Date: 20110517
Implantable Lead Location: 753860
Implantable Lead Model: 4470
Implantable Lead Serial Number: 672341
Implantable Pulse Generator Implant Date: 20110517
Lead Channel Pacing Threshold Amplitude: 0.375 V
Lead Channel Pacing Threshold Amplitude: 0.5 V
Lead Channel Pacing Threshold Pulse Width: 0.4 ms
Lead Channel Sensing Intrinsic Amplitude: 2.8 mV
Lead Channel Setting Pacing Amplitude: 2.5 V
Lead Channel Setting Pacing Pulse Width: 0.4 ms
MDC IDC LEAD IMPLANT DT: 20110517
MDC IDC LEAD LOCATION: 753859
MDC IDC LEAD SERIAL: 543103
MDC IDC MSMT BATTERY IMPEDANCE: 367 Ohm
MDC IDC MSMT LEADCHNL RA IMPEDANCE VALUE: 630 Ohm
MDC IDC MSMT LEADCHNL RV IMPEDANCE VALUE: 694 Ohm
MDC IDC MSMT LEADCHNL RV PACING THRESHOLD PULSEWIDTH: 0.4 ms
MDC IDC SET LEADCHNL RA PACING AMPLITUDE: 2 V
MDC IDC SET LEADCHNL RV SENSING SENSITIVITY: 2 mV
MDC IDC STAT BRADY AS VS PERCENT: 0 %

## 2016-11-22 ENCOUNTER — Encounter: Payer: Self-pay | Admitting: Diagnostic Neuroimaging

## 2016-11-22 ENCOUNTER — Ambulatory Visit (INDEPENDENT_AMBULATORY_CARE_PROVIDER_SITE_OTHER): Payer: Medicare Other | Admitting: Diagnostic Neuroimaging

## 2016-11-22 VITALS — BP 110/72 | HR 74 | Wt 188.0 lb

## 2016-11-22 DIAGNOSIS — G3184 Mild cognitive impairment, so stated: Secondary | ICD-10-CM

## 2016-11-22 DIAGNOSIS — R413 Other amnesia: Secondary | ICD-10-CM | POA: Diagnosis not present

## 2016-11-22 DIAGNOSIS — G4733 Obstructive sleep apnea (adult) (pediatric): Secondary | ICD-10-CM
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
## 2016-12-07 ENCOUNTER — Encounter: Payer: Self-pay | Admitting: Neurology

## 2016-12-09 ENCOUNTER — Ambulatory Visit (INDEPENDENT_AMBULATORY_CARE_PROVIDER_SITE_OTHER): Payer: Medicare Other | Admitting: Neurology

## 2016-12-09 ENCOUNTER — Encounter: Payer: Self-pay | Admitting: Neurology

## 2016-12-09 VITALS — BP 143/80 | HR 81 | Ht 69.0 in | Wt 186.0 lb

## 2016-12-09 DIAGNOSIS — Z9989 Dependence on other enabling machines and devices: Secondary | ICD-10-CM

## 2016-12-09 DIAGNOSIS — G4733 Obstructive sleep apnea (adult) (pediatric): Secondary | ICD-10-CM

## 2016-12-09 NOTE — Patient Instructions (Signed)

## 2016-12-09 NOTE — Progress Notes (Signed)
Subjective:    Patient ID: Robert Ramsey is a 76 y.o. male.  HPI     Interim history:   Robert Ramsey is a very pleasant 76 year old right-handed gentleman with an underlying medical history of hypertension, complete heart block, hyperlipidemia, PVCs, coronary artery disease, impaired glucose tolerance, reflux disease, overweight state, memory loss (followed by Robert Ramsey), status post cataract surgery, appendectomy, pacemaker placement and right nephrectomy, who presents for follow-up consultation of his obstructive sleep apnea, established on CPAP therapy. The patient is accompanied by his wife today. Robert Ramsey last saw him on 12/10/2015, at which time she reported doing well, his memory was stable, he was compliant with CPAP. Robert Ramsey suggested a one-year checkup.  Today, 12/09/2016 (all dictated new, as well as above notes, some dictation done in note pad or Word, outside of chart, may appear as copied):   Robert Ramsey reviewed his CPAP compliance data from 11/08/2016 through 12/07/2016 which is a total of 30 days, during which time he used his CPAP every night with percent used days greater than 4 hours at 100%, indicating superb compliance with an average usage of 7 hours and 43 minutes, residual AHI 0.2 per hour, leak acceptable with the 95th percentile at 9 L/m on a pressure of 9 cm with EPR of 3. He reports doing well, BP stable, weight stable, had cardiology FU in 2/18, goes q 6 mo. He was seen recently by Robert Ramsey on 11/22/2016 and Robert Ramsey reviewed the note. He was doing well enough with regards to his mild cognitive impairment for a one-year checkup. MMSE was 28.   The patient's allergies, current medications, family history, past medical history, past social history, past surgical history and problem list were reviewed and updated as appropriate.   Previously (copied from previous notes for reference):   Robert Ramsey saw him on 05/27/2015, at which time we talked about his sleep study results first baseline sleep study  as well as his CPAP titration study. He was compliant with CPAP therapy. He reported having adjusted well. Felt more rested, more motivated to do things and memory was stable. Overall he felt improved and was motivated to continue with CPAP therapy. He saw Robert Ramsey on 10/20/2015 in the interim and Robert Ramsey reviewed that note today.    Robert Ramsey reviewed his CPAP compliance data from 11/09/2015 through 12/08/2015 which is a total of 30 days during which time he used his machine every day with percent used days greater than 4 hours at 100%, indicating superb compliance with an average usage of 8 hours and 7 minutes, residual AHI low at 0.2 per hour, leaked low with the 95th percentile at 6.8 L/m on a pressure of 9 cm with EPR of 3.    Robert Ramsey first met him on 02/19/2015 at the request of Robert Ramsey, at which time the patient reported snoring and excessive daytime somnolence as well as a prior diagnosis of obstructive sleep apnea in 1995. Robert Ramsey invited him back for sleep study testing. He had a baseline sleep study, followed by a CPAP titration study and Robert Ramsey went over his test results with him in detail today. His baseline sleep study from 03/04/2015 showed a sleep efficiency of 73.3%, latency to sleep was 21 minutes and wake after sleep onset was 99 minutes with mild to moderate sleep fragmentation noted. His arousal index was 10.6 per hour. He had an increased percentage of stage II sleep, absence of slow-wave sleep and low percentage of REM sleep at 4.9% with a very prolonged REM  latency of 287.5 minutes. He had no significant PLMS. He had occasional PVCs and PACs on EKG. He had intermittent mild snoring. Total AHI was borderline at 5.3 per hour, rising to 52.5 per hour during REM sleep and 10.6 per hour in the supine position. Average oxygen saturation was 93%, nadir was 80%. Time below 90% saturation was 21 minutes, time below 88% saturation was 9 minutes. Based on his sleep related complaints, his cognitive complaints, and his  medical history, Robert Ramsey suggested he return for a second sleep study for CPAP titration. He had this on 04/01/2015. Sleep efficiency was 76.5%, sleep latency was 26.5 minutes and wake after sleep onset was 50.5 minutes with mild sleep fragmentation noted. He had a normal arousal index. He had an increased percentage of stage II sleep, absence of slow-wave sleep and REM sleep at 11.6% with a mildly prolonged REM latency of 138 minutes. He had moderate PLMS with an index of 26.7 per hour, resulting in only 3.5 arousals per hour. He had rare PVCs on EKG. He had no significant snoring. CPAP was titrated from 5 cm to 9 cm. AHI was 0 per hour on the final pressure with supine REM sleep achieved on the final pressure. Based on his test results are prescribed CPAP therapy for home use.    Robert Ramsey reviewed his CPAP compliance data from 04/26/2015 through 05/25/2015 which is a total of 30 days during which time he used his machine every night with percent used days greater than 4 hours at 100%, indicating superb compliance with an average usage of 8 hours, residual AHI low at 0.3 per hour, leak acceptable with the 95th percentile at 14.7 L/m on a pressure of 9 cm with EPR of 3.   02/19/2015: He reports snoring and excessive daytime somnolence. Robert Ramsey reviewed your office note from 01/15/2015. He noticed some hearing loss and referred him for audiometry.   His snoring has improved a little, worse on his back, per wife. He denies morning headaches and has occasional nocturia. He denies RLS symptoms and does not tend to kicks his legs in his sleep. Bedtime is around MN and falling asleep is usually not a problem. He does watch TV prior to going to bed but typically not while in bed. He had sleep study testing in 1995 which per wife showed mild obstructive sleep apnea but not enough to use CPAP. CPAP was discussed at the time however. He is scheduled for neuropsychological evaluation in September. He has hearing testing pending for later  this month and a follow-up with you in October. He reports mild snoring. Very occasionally has woken himself up with a sense of gasping or snorting. His wife has noticed occasional snorting sounds. He denies any overt family history of obstructive sleep apnea. He used to smoke a pipe but quit in 1987. He drinks coffee, 2 cups each morning and occasional tea for lunch. He does not drink any alcohol. He had 3 brothers. One is alive , 1 died from a heart attack and the other brother died after a truck accident. His main complaint is lack of energy during the day. Rise time is around 8 or 8:30. He does not always wake up rested and used to take extended naps but his cardiologist advised him to be more active during the day and less sedentary. He does like to nap. His Epworth sleepiness score is 8 out of 24 today, his fatigue scores 51 out of 63.    His Past Medical History  Is Significant For: Past Medical History:  Diagnosis Date  . Abnormal stress test 09/15/10   SEPTAL DEFECT SECONDARY TO PREVIOUS INFARCT VS LBBB; MILD SEPTAL ISCHEMIA  . CAD (coronary artery disease)    cath in 2007 reveals mild diffuse CAD  . Complete heart block [facility]) May 2011   has PTVP in place  . Glucose intolerance (impaired glucose tolerance)   . History of colonoscopy   . HTN (hypertension)   . Memory impairment    better with lower dose statin  . Pacemaker   . PVC's (premature ventricular contractions)   . Ventral hernia     His Past Surgical History Is Significant For: Past Surgical History:  Procedure Laterality Date  . APPENDECTOMY    . CARDIAC CATHETERIZATION  07/29/2005   EF 50-55%; Has mild diffuse CAD  . cataract surgery  06/2009, 10/2014  . LBBB    . PACEMAKER INSERTION  11/18/09   MDT implanted by Dr Doreatha Lew  . right nephrectomy  01/2006   partial for mass    His Family History Is Significant For: Family History  Problem Relation Age of Onset  . Heart failure Father   . Leukemia Mother     His  Social History Is Significant For: Social History   Social History  . Marital status: Married    Spouse name: Robert Ramsey  . Number of children: 4  . Years of education: 12   Occupational History  . Retired     [employer]   Social History Main Topics  . Smoking status: Former Smoker    Quit date: 07/06/1983  . Smokeless tobacco: Never Used     Comment: stopped smoking in 1985  . Alcohol use No  . Drug use: No  . Sexual activity: Not Currently   Other Topics Concern  . None   Social History Narrative   Retired from [employer], [employer].   Lives at home with wife   Caffeine use- morning coffee and 2 cup/ 1 cup of tea at lunch     His Allergies Are:  Allergies  Allergen Reactions  . Cinnamon     Cinnamon toothpaste caused him to break out inside his mouth and lesions were removed  . Flexeril [Cyclobenzaprine Hcl]     Possible heart side effects  . Imodium [Loperamide Hcl]     unknown  . Levofloxacin Other (See Comments)    Elevated heart rate  . Nabumetone     REACTION: Heart issues/hospital admission  . Naproxen     Effects blood pressure and heart  . Nsaids     Effects blood pressure and heart  . Vantin     Pseudo colitis  . Vioxx [Rofecoxib]     Effects blood pressure and heart  :   His Current Medications Are:  Outpatient Encounter Prescriptions as of 12/09/2016  Medication Sig  . aspirin 81 MG tablet Take 81 mg by mouth daily.  Marland Kitchen ezetimibe (ZETIA) 10 MG tablet TAKE 1 TABLET BY MOUTH  DAILY  . losartan (COZAAR) 25 MG tablet Take 1 tablet (25 mg total) by mouth daily.  . Multiple Vitamin (MULTIVITAMIN) tablet Take 1 tablet by mouth daily.    . ranitidine (ZANTAC) 150 MG tablet Take 150 mg by mouth 2 (two) times daily.   . rosuvastatin (CRESTOR) 5 MG tablet Take 2.5 mg by mouth 3 (three) times a week.   . VESICARE 10 MG tablet Take 10 mg by mouth daily.    No facility-administered encounter  medications on file as of 12/09/2016.   :  Review of Systems:  Out of a  complete 14 point review of systems, all are reviewed and negative with the exception of these symptoms as listed below: Review of Systems  Neurological:       Pt presents today to discuss his cpap. Pt has had no problems and denies any complaints today.    Objective:  Neurologic Exam  Physical Exam Physical Examination:   Vitals:   12/09/16 1300  BP: (!) 143/80  Pulse: 81   General Examination: The patient is a very pleasant 76 y.o. male in no acute distress. He appears well-developed and well-nourished and well groomed. Good spirits.   HEENT: Normocephalic, atraumatic, pupils are equal, round and reactive to light and accommodation. He is status post bilateral cataract repairs. Has corrective eye glasses. Extraocular tracking is good without limitation to gaze excursion or nystagmus noted. Normal smooth pursuit is noted. Hearing is grossly intact. Face is symmetric with normal facial animation and normal facial sensation. Speech is clear with no dysarthria noted. There is no hypophonia. There is no lip, neck/head, jaw or voice tremor. Neck shows full range of motion. Oropharynx exam reveals: mild to moderate mouth dryness, adequate dental hygiene and mild airway crowding, smaller airway entry. Mallampati is class II. Tongue protrudes centrally and palate elevates symmetrically. Tonsils are small in size.   Chest: Clear to auscultation without wheezing, rhonchi or crackles noted.  Heart: S1+S2+0, regular and normal without murmurs, rubs or gallops noted.   Abdomen: Soft, non-tender and non-distended with normal bowel sounds appreciated on auscultation.  Extremities: There is trace pitting edema in the L ankle. He has a knee high compression stocking on the right.  Skin: Warm and dry without trophic changes noted. There are no varicose veins.  Musculoskeletal: exam reveals no obvious joint deformities, tenderness or joint swelling or erythema.   Neurologically:  Mental  status: The patient is awake, alert and oriented in all 4 spheres. His immediate and remote memory, attention, language skills and fund of knowledge are fairly appropriate. There is no evidence of aphasia, agnosia, apraxia or anomia. Speech is clear with normal prosody and enunciation. Thought process is linear. Mood is normal and affect is normal.  Cranial nerves II - XII are as described above under HEENT exam. In addition: shoulder shrug is normal with equal shoulder height noted. Motor exam: Normal bulk, strength and tone is noted. There is no drift, tremor or rebound. Romberg is negative. Reflexes are 1-2+ throughout. Fine motor skills and coordination: intact for age grossly.  Cerebellar testing: No dysmetria or intention tremor on finger to nose testing. Sensory exam: intact to light touch in the upper and lower extremities.  Gait, station and balance: He stands easily. No veering to one side is noted. No leaning to one side is noted. Posture is age-appropriate and stance is narrow based. Gait shows normal stride length and normal pace. No problems turning are noted. Tandem gait okay for age.   Assessment and Plan:   In summary, REIS GOGA is a very pleasant 76 year old male with an underlying medical history of hypertension, complete heart block, hyperlipidemia, PVCs, coronary artery disease, impaired glucose tolerance, reflux disease, overweight state, memory loss, status post cataract surgery, appendectomy, pacemaker placement and right nephrectomy, who presents for follow-up consultation of his obstructive sleep apnea, with significant REM predominance with s/p sleep study testing in August 2016 (baseline) and September 2016 (PAP titration). He has done well  on CPAP therapy at a pressure of 9 cm with full compliance and ongoing good results reported. Today, we reviewed his latest compliance data in detail. He is commended for his superb treatment adherence. He is reminded to be  well-hydrated and exercise regularly. His memory scores have been stable and he is in regular checkup with [place] with Robert Ramsey. From my end of things he has done rather well. Robert Ramsey suggested a one-year checkup with one of our nurse practitioners. Robert Ramsey answered all Robert Ramsey questions today and the patient and his wife were in agreement. Robert Ramsey spent 20 minutes in total face-to-face time with the patient, more than 50% of which was spent in counseling and coordination of care, reviewing test results, reviewing medication and discussing or reviewing the diagnosis of OSA, its prognosis and treatment options. Pertinent laboratory and imaging test results that were available during this visit with the patient were reviewed by me and considered in my medical decision making (see chart for details).

## 2016-12-14 ENCOUNTER — Telehealth: Payer: Self-pay | Admitting: Nurse Practitioner

## 2016-12-14 NOTE — Telephone Encounter (Signed)
S/w pt wanted to know if needed to fast on day of appointment in August, stated yes was due for lipids and to home remote before pt comes in. Pt is aware.

## 2016-12-14 NOTE — Telephone Encounter (Signed)
Patient wife, calling would like to know if patient needs to have cholesterol checked as well as lipid panel. Please call to discuss,thanks.

## 2016-12-14 NOTE — Telephone Encounter (Signed)
Patient wife calling, states that patient has an appt with Truitt Merle on the same day as home remote pacer check and would like to know if they can have pace maker checked in office

## 2017-01-07 DIAGNOSIS — L308 Other specified dermatitis: Secondary | ICD-10-CM | POA: Diagnosis not present

## 2017-01-25 DIAGNOSIS — R972 Elevated prostate specific antigen [PSA]: Secondary | ICD-10-CM | POA: Diagnosis not present

## 2017-01-31 ENCOUNTER — Other Ambulatory Visit: Payer: Self-pay

## 2017-02-01 ENCOUNTER — Ambulatory Visit: Payer: Medicare Other | Admitting: Diagnostic Neuroimaging

## 2017-02-01 DIAGNOSIS — N402 Nodular prostate without lower urinary tract symptoms: Secondary | ICD-10-CM | POA: Diagnosis not present

## 2017-02-01 DIAGNOSIS — R350 Frequency of micturition: Secondary | ICD-10-CM | POA: Diagnosis not present

## 2017-02-09 ENCOUNTER — Other Ambulatory Visit: Payer: Medicare Other

## 2017-02-09 ENCOUNTER — Ambulatory Visit (INDEPENDENT_AMBULATORY_CARE_PROVIDER_SITE_OTHER): Payer: Medicare Other | Admitting: *Deleted

## 2017-02-09 ENCOUNTER — Encounter: Payer: Self-pay | Admitting: Nurse Practitioner

## 2017-02-09 ENCOUNTER — Ambulatory Visit (INDEPENDENT_AMBULATORY_CARE_PROVIDER_SITE_OTHER): Payer: Medicare Other | Admitting: Nurse Practitioner

## 2017-02-09 VITALS — BP 150/76 | HR 63 | Ht 69.0 in | Wt 183.4 lb

## 2017-02-09 DIAGNOSIS — E7800 Pure hypercholesterolemia, unspecified: Secondary | ICD-10-CM

## 2017-02-09 DIAGNOSIS — I442 Atrioventricular block, complete: Secondary | ICD-10-CM

## 2017-02-09 DIAGNOSIS — I2510 Atherosclerotic heart disease of native coronary artery without angina pectoris: Secondary | ICD-10-CM

## 2017-02-09 DIAGNOSIS — Z950 Presence of cardiac pacemaker: Secondary | ICD-10-CM | POA: Diagnosis not present

## 2017-02-09 LAB — BASIC METABOLIC PANEL
BUN/Creatinine Ratio: 20 (ref 10–24)
BUN: 18 mg/dL (ref 8–27)
CO2: 22 mmol/L (ref 20–29)
Calcium: 10 mg/dL (ref 8.6–10.2)
Chloride: 105 mmol/L (ref 96–106)
Creatinine, Ser: 0.92 mg/dL (ref 0.76–1.27)
GFR calc Af Amer: 92 mL/min/{1.73_m2} (ref >59)
GFR calc non Af Amer: 80 mL/min/{1.73_m2} (ref >59)
Glucose: 91 mg/dL (ref 65–99)
Potassium: 4.8 mmol/L (ref 3.5–5.2)
Sodium: 141 mmol/L (ref 134–144)

## 2017-02-09 LAB — LIPID PANEL
Chol/HDL Ratio: 2.5 ratio (ref 0.0–5.0)
Cholesterol, Total: 116 mg/dL (ref 100–199)
HDL: 47 mg/dL (ref >39)
LDL Calculated: 52 mg/dL (ref 0–99)
Triglycerides: 86 mg/dL (ref 0–149)
VLDL Cholesterol Cal: 17 mg/dL (ref 5–40)

## 2017-02-09 LAB — HEPATIC FUNCTION PANEL
ALT: 16 IU/L (ref 0–44)
AST: 16 IU/L (ref 0–40)
Albumin: 4.2 g/dL (ref 3.5–4.8)
Alkaline Phosphatase: 55 IU/L (ref 39–117)
Bilirubin Total: 0.4 mg/dL (ref 0.0–1.2)
Bilirubin, Direct: 0.12 mg/dL (ref 0.00–0.40)
Total Protein: 6.4 g/dL (ref 6.0–8.5)

## 2017-02-09 NOTE — Progress Notes (Signed)
CARDIOLOGY OFFICE NOTE  Date:  02/09/2017    [name] Date of Birth: 1940-08-15 Medical Record #[id number]  PCP:  Elby Showers, MD  Cardiologist:  [place]   Chief Complaint  Patient presents with  . Coronary Artery Disease    Seen for Dr. Rayann Heman    History of Present Illness: Robert Ramsey is a 76 y.o. male who presents today for a follow up visit. Seen for Dr. Rayann Heman. He is a former patient of Dr. Susa Simmonds.   He has mild CAD, HTN, HLD and pacemaker in place for Mobitz II AV block. Tolerates only low dose statin due to memory issues. He has had remote cath from 2007 showing mild diffuse CAD. He has had prior right partial nephrectomy for a renal mass by Dr. Karsten Ro back in 2012. He has had some progressive memory issues.   Last seen in [place] by Dr. Rayann Heman - was doing ok but metoprolol was stopped due to possible memory issues and he was started on Losartan. I saw him a year ago and he was doing ok but having issues with swelling - lots of excess salt in their diet noted.   Comes in today. Here with his wife. He is doing well. Has a rash on his hand - using some type of ointment. Not exercising - wife says that is her fault due to her RA issues. He is "socialize" as much as possible - due to his memory issues. They still eat a lot. BP much better at home by their report - rarely above 132 systolic. No chest pain. Breathing is good. He wears only one support stocking. He is to get fasting labs here today. Really no issue/concerns noted today.   Past Medical History:  Diagnosis Date  . Abnormal stress test 09/15/10   SEPTAL DEFECT SECONDARY TO PREVIOUS INFARCT VS LBBB; MILD SEPTAL ISCHEMIA  . CAD (coronary artery disease)    cath in 2007 reveals mild diffuse CAD  . Complete heart block [facility]) May 2011   has PTVP in place  . Glucose intolerance (impaired glucose tolerance)   . History of colonoscopy   . HTN (hypertension)   . Memory impairment    better with lower dose statin  . Pacemaker   . PVC's (premature ventricular contractions)   . Ventral hernia     Past Surgical History:  Procedure Laterality Date  . APPENDECTOMY    . CARDIAC CATHETERIZATION  07/29/2005   EF 50-55%; Has mild diffuse CAD  . cataract surgery  06/2009, 10/2014  . LBBB    . PACEMAKER INSERTION  11/18/09   MDT implanted by Dr Doreatha Lew  . right nephrectomy  01/2006   partial for mass     Medications: Current Meds  Medication Sig  . aspirin 81 MG tablet Take 81 mg by mouth daily.  Marland Kitchen ezetimibe (ZETIA) 10 MG tablet TAKE 1 TABLET BY MOUTH  DAILY  . Multiple Vitamin (MULTIVITAMIN) tablet Take 1 tablet by mouth daily.    . ranitidine (ZANTAC) 150 MG tablet Take 150 mg by mouth 2 (two) times daily.   . rosuvastatin (CRESTOR) 5 MG tablet Take 2.5 mg by mouth 3 (three) times a week.   . triamcinolone ointment (KENALOG) 0.1 % Apply 1 application topically at bedtime as needed. Flare up between fingers  . VESICARE 10 MG tablet Take 10 mg by mouth daily.      Allergies: Allergies  Allergen Reactions  . Cinnamon  Cinnamon toothpaste caused him to break out inside his mouth and lesions were removed  . Flexeril [Cyclobenzaprine Hcl]     Possible heart side effects  . Imodium [Loperamide Hcl]     unknown  . Levofloxacin Other (See Comments)    Elevated heart rate  . Nabumetone     REACTION: Heart issues/hospital admission  . Naproxen     Effects blood pressure and heart  . Nsaids     Effects blood pressure and heart  . Vantin     Pseudo colitis  . Vioxx [Rofecoxib]     Effects blood pressure and heart    Social History: The patient  reports that he quit smoking about 33 years ago. He has never used smokeless tobacco. He reports that he does not drink alcohol or use drugs.   Family History: The patient's family history includes Heart failure in his father; Leukemia in his mother.   Review of Systems: Please see the history of present illness.    Otherwise, the review of systems is positive for none.   All other systems are reviewed and negative.   Physical Exam: VS:  BP (!) 150/76 (BP Location: Left Arm, Patient Position: Sitting, Cuff Size: Normal)   Pulse 63   Ht 5\' 9"  (1.753 m)   Wt 183 lb 6.4 oz (83.2 kg)   SpO2 99% Comment: at rest  BMI 27.08 kg/m  .  BMI Body mass index is 27.08 kg/m.  Wt Readings from Last 3 Encounters:  02/09/17 183 lb 6.4 oz (83.2 kg)  12/09/16 186 lb (84.4 kg)  11/22/16 188 lb (85.3 kg)   Repeat BP by me is 140/70   General: Pleasant. Well developed, well nourished and in no acute distress.   HEENT: Normal.  Neck: Supple, no JVD, carotid bruits, or masses noted.  Cardiac: Regular rate and rhythm. Heart tones are distant.  No edema.  Respiratory:  Lungs are clear to auscultation bilaterally with normal work of breathing.  GI: Soft and nontender.  MS: No deformity or atrophy. Gait and ROM intact.  Skin: Warm and dry. Color is normal.  Neuro:  Strength and sensation are intact and no gross focal deficits noted.  Psych: Alert, appropriate and with normal affect.   LABORATORY DATA:  EKG:  EKG is not ordered today.  Lab Results  Component Value Date   WBC 5.7 11/06/2015   HGB 14.8 11/06/2015   HCT 43.8 11/06/2015   PLT 199 11/06/2015   GLUCOSE 103 (H) 08/18/2016   CHOL 114 (L) 02/27/2016   TRIG 109 02/27/2016   HDL 40 02/27/2016   LDLCALC 52 02/27/2016   ALT 15 08/18/2016   AST 17 08/18/2016   NA 141 08/18/2016   K 4.4 08/18/2016   CL 103 08/18/2016   CREATININE 0.88 08/18/2016   BUN 16 08/18/2016   CO2 20 08/18/2016   TSH 2.242 12/16/2014   HGBA1C 5.8 (H) 12/16/2014     BNP (last 3 results) No results for input(s): BNP in the last 8760 hours.  ProBNP (last 3 results) No results for input(s): PROBNP in the last 8760 hours.   Other Studies Reviewed Today:   Assessment/Plan:  1. CAD - mild/diffuse disease per remote cath - no symptoms clinically. Encouraged CV risk  factor modification.   2. HTN - BP ok on current regimen - they do monitor at home.   3. HLD - tolerates only low dose statin therapy - lab today  4. PPM - followed by Dr.  Allred  5. Past renal mass - followed by GU - not discussed today.    6. Progressive memory issues -  Followed by neurology yearly.   Current medicines are reviewed with the patient today.  The patient does not have concerns regarding medicines other than what has been noted above.  The following changes have been made:  See above.  Labs/ tests ordered today include:    Orders Placed This Encounter  Procedures  . Basic metabolic panel  . Hepatic function panel  . Lipid panel     Disposition:   FU with Dr. Rayann Heman in 6 months. See me in one year.   Patient is agreeable to this plan and will call if any problems develop in the interim.   SignedTruitt Merle, NP  02/09/2017 11:24 AM  [facility] [address] [place], [place]  [address] Phone: [phone] Fax: [phone]

## 2017-02-09 NOTE — Progress Notes (Signed)
Remote pacemaker transmission.   

## 2017-02-09 NOTE — Patient Instructions (Addendum)
We will be checking the following labs today - BMET, Lipids and HPF   Medication Instructions:    Continue with your current medicines.     Testing/Procedures To Be Arranged:  N/A  Follow-Up:   See Dr. Rayann Heman in 6 months and me in one year with fasting labs.     Other Special Instructions:   N/A    If you need a refill on your cardiac medications before your next appointment, please call your pharmacy.   Call the [place] office at [phone] if you have any questions, problems or concerns.

## 2017-02-10 ENCOUNTER — Encounter: Payer: Self-pay | Admitting: Cardiology

## 2017-02-27 LAB — CUP PACEART REMOTE DEVICE CHECK
Battery Impedance: 415 Ohm
Battery Remaining Longevity: 94 mo
Battery Voltage: 2.79 V
Brady Statistic AP VP Percent: 5 %
Brady Statistic AS VP Percent: 94 %
Implantable Lead Location: 753859
Implantable Lead Model: 4469
Implantable Lead Serial Number: 543103
Implantable Pulse Generator Implant Date: 20110517
Lead Channel Pacing Threshold Amplitude: 0.375 V
Lead Channel Pacing Threshold Pulse Width: 0.4 ms
Lead Channel Setting Pacing Amplitude: 2.5 V
Lead Channel Setting Pacing Pulse Width: 0.4 ms
MDC IDC LEAD IMPLANT DT: 20110517
MDC IDC LEAD IMPLANT DT: 20110517
MDC IDC LEAD LOCATION: 753860
MDC IDC LEAD SERIAL: 672341
MDC IDC MSMT LEADCHNL RA IMPEDANCE VALUE: 664 Ohm
MDC IDC MSMT LEADCHNL RV IMPEDANCE VALUE: 709 Ohm
MDC IDC MSMT LEADCHNL RV PACING THRESHOLD AMPLITUDE: 0.5 V
MDC IDC MSMT LEADCHNL RV PACING THRESHOLD PULSEWIDTH: 0.4 ms
MDC IDC SESS DTM: 20180808105314
MDC IDC SET LEADCHNL RA PACING AMPLITUDE: 2 V
MDC IDC SET LEADCHNL RV SENSING SENSITIVITY: 2 mV
MDC IDC STAT BRADY AP VS PERCENT: 0 %
MDC IDC STAT BRADY AS VS PERCENT: 0 %

## 2017-03-10 DIAGNOSIS — H5000 Unspecified esotropia: Secondary | ICD-10-CM | POA: Diagnosis not present

## 2017-03-10 DIAGNOSIS — H04123 Dry eye syndrome of bilateral lacrimal glands: Secondary | ICD-10-CM | POA: Diagnosis not present

## 2017-03-10 DIAGNOSIS — Z961 Presence of intraocular lens: Secondary | ICD-10-CM | POA: Diagnosis not present

## 2017-03-10 DIAGNOSIS — H5202 Hypermetropia, left eye: Secondary | ICD-10-CM | POA: Diagnosis not present

## 2017-03-14 ENCOUNTER — Other Ambulatory Visit: Payer: Medicare Other | Admitting: Internal Medicine

## 2017-03-17 ENCOUNTER — Ambulatory Visit (INDEPENDENT_AMBULATORY_CARE_PROVIDER_SITE_OTHER): Payer: Medicare Other | Admitting: Internal Medicine

## 2017-03-17 ENCOUNTER — Encounter: Payer: Self-pay | Admitting: Internal Medicine

## 2017-03-17 VITALS — BP 124/72 | HR 79 | Temp 97.8°F | Ht 66.5 in | Wt 183.0 lb

## 2017-03-17 DIAGNOSIS — R413 Other amnesia: Secondary | ICD-10-CM

## 2017-03-17 DIAGNOSIS — I10 Essential (primary) hypertension: Secondary | ICD-10-CM | POA: Diagnosis not present

## 2017-03-17 DIAGNOSIS — G4730 Sleep apnea, unspecified: Secondary | ICD-10-CM | POA: Diagnosis not present

## 2017-03-17 DIAGNOSIS — Z0000 Encounter for general adult medical examination without abnormal findings: Secondary | ICD-10-CM

## 2017-03-17 DIAGNOSIS — Z950 Presence of cardiac pacemaker: Secondary | ICD-10-CM | POA: Diagnosis not present

## 2017-03-17 DIAGNOSIS — I2510 Atherosclerotic heart disease of native coronary artery without angina pectoris: Secondary | ICD-10-CM

## 2017-03-17 DIAGNOSIS — E784 Other hyperlipidemia: Secondary | ICD-10-CM

## 2017-03-17 DIAGNOSIS — R7302 Impaired glucose tolerance (oral): Secondary | ICD-10-CM

## 2017-03-17 DIAGNOSIS — M19042 Primary osteoarthritis, left hand: Secondary | ICD-10-CM | POA: Diagnosis not present

## 2017-03-17 DIAGNOSIS — E7849 Other hyperlipidemia: Secondary | ICD-10-CM

## 2017-03-17 NOTE — Progress Notes (Signed)
Subjective:    Patient ID: Robert Ramsey, male    DOB: 22-Aug-1940, 76 y.o.   MRN: [id number]  HPI  76 year old Male for health maintenance exam, Medicare wellness, and evaluation of medical issues.  He sees Remer Macho at [facility]. He has a pacemaker which was placed2011 for second-degree AV block Mobitz II. He has hyperlipidemia and mild coronary artery disease as well as hypertension. History of GE reflux. He is on Crestor 5 mg daily and Prilosec 20 mg daily. He also takes Zantac 150 mg twice daily. He is on Zetia 10 mg daily and Cozaar 25 mg daily. Catheterization in 2007 revealed mild diffuse coronary artery disease. He has a history of impaired glucose tolerance.  He had a partial right nephrectomy for mass in 2007. Had cataract surgery December 2010. History of appendectomy. History of left bundle branch block. Quit smoking in 1985.  He has a number of drug intolerances see list of allergies but many of these are intolerances.  Family history: Father deceased due to heart failure and mother deceased due to leukemia  Social history: He is married to a retired Marine scientist  He is followed by neurology for memory issues. Wife reports he sometimes gets a bit lost when driving. He can't seem to find the most direct way to a place while driving. He doesn't drive long distances just around town.  He has a history of sleep apnea. He has had neuropsychological testing by Dr.Zelson in 2016. He was diagnosed with mild cognitive impairment amnestic type  Review of Systems  Respiratory: Negative.   Cardiovascular: Negative.   Gastrointestinal: Negative.   Endocrine: Negative.        Objective:   Physical Exam  Constitutional: He is oriented to person, place, and time. He appears well-developed and well-nourished.  HENT:  Head: Normocephalic and atraumatic.  Right Ear: External ear normal.  Left Ear: External ear normal.  Mouth/Throat: Oropharynx is clear and moist.  Eyes: Pupils  are equal, round, and reactive to light. Conjunctivae and EOM are normal. No scleral icterus.  Neck: Neck supple. No JVD present. No tracheal deviation present. No thyromegaly present.  Cardiovascular: Normal rate, regular rhythm, normal heart sounds and intact distal pulses.   No murmur heard. Pulmonary/Chest: Effort normal and breath sounds normal. He has no rales.  Abdominal: Soft. Bowel sounds are normal. He exhibits no distension and no mass. There is no tenderness. There is no rebound and no guarding.  Genitourinary: Prostate normal.  Musculoskeletal: He exhibits no edema.  Lymphadenopathy:    He has no cervical adenopathy.  Neurological: He is alert and oriented to person, place, and time. He has normal reflexes. No cranial nerve deficit. Coordination normal.  Skin: Skin is warm and dry.  Psychiatric: He has a normal mood and affect. His behavior is normal. Judgment and thought content normal.          Assessment & Plan:  Mild cognitive impairment followed by neurology  Hypertension-stable on treatment  Coronary artery disease  History of pacemaker  GE reflux-2 drug regimen  Hyperlipidemia-2 drug regimen  Sleep apnea-C Pap is been ordered  Frequent urination treated with Vesicare  Plan: Continue current treatment through other providers and return here in one year or as needed. Lab work reviewed with him is within normal limits. An A1c was not done but can be obtained at a later time. Last one on file was 2016 and was 5.8%  Subjective:   Patient presents for  Medicare Annual/Subsequent preventive examination.  Review Past Medical/Family/Social:See above   Risk Factors  Current exercise habits: Walks Dietary issues discussed: Low fat low carb  Cardiac risk factors: Family history in father, hyperlipidemia, hypertension, documented coronary disease  Depression Screen  (Note: if answer to either of the following is "Yes", a more complete depression screening is  indicated)   Over the past two weeks, have you felt down, depressed or hopeless? No  Over the past two weeks, have you felt little interest or pleasure in doing things? No Have you lost interest or pleasure in daily life? No Do you often feel hopeless? No Do you cry easily over simple problems? No   Activities of Daily Living  In your present state of health, do you have any difficulty performing the following activities?:   Driving? No  Managing money? No  Feeding yourself? No  Getting from bed to chair? No  Climbing a flight of stairs? No  Preparing food and eating?: No  Bathing or showering? No  Getting dressed: No  Getting to the toilet? No  Using the toilet:No  Moving around from place to place: No  In the past year have you fallen or had a near fall?:No  Are you sexually active? No  Do you have more than one partner? No   Hearing Difficulties: No  Do you often ask people to speak up or repeat themselves? No  Do you experience ringing or noises in your ears? No  Do you have difficulty understanding soft or whispered voices? Yes Do you feel that you have a problem with memory?Yes Do you often misplace items? No    Home Safety:  Do you have a smoke alarm at your residence? Yes Do you have grab bars in the bathroom?Yes Do you have throw rugs in your house? Yes   Cognitive Testing  Alert? Yes Normal Appearance?Yes  Oriented to person? Yes Place? Yes  Time? Yes  Recall of three objects? Not tested Can perform simple calculations? Not tested deferred to neurology Displays appropriate judgment?Yes  Can read the correct time from a watch face?Yes   List the Names of Other Physician/Practitioners you currently use:  See referral list for the physicians patient is currently seeing.     Review of Systems: See above   Objective:     General appearance: Appears stated age  Head: Normocephalic, without obvious abnormality, atraumatic  Eyes: conj clear, EOMi PEERLA   Ears: normal TM's and external ear canals both ears  Nose: Nares normal. Septum midline. Mucosa normal. No drainage or sinus tenderness.  Throat: lips, mucosa, and tongue normal; teeth and gums normal  Neck: no adenopathy, no carotid bruit, no JVD, supple, symmetrical, trachea midline and thyroid not enlarged, symmetric, no tenderness/mass/nodules  No CVA tenderness.  Lungs: clear to auscultation bilaterally  Breasts: normal appearance, no masses or tenderness Heart: regular rate and rhythm, S1, S2 normal, no murmur, click, rub or gallop  Abdomen: soft, non-tender; bowel sounds normal; no masses, no organomegaly  Musculoskeletal: ROM normal in all joints, no crepitus, no deformity, Normal muscle strengthen. Back  is symmetric, no curvature. Skin: Skin color, texture, turgor normal. No rashes or lesions  Lymph nodes: Cervical, supraclavicular, and axillary nodes normal.  Neurologic: CN 2 -12 Normal, Normal symmetric reflexes. Normal coordination and gait  Psych: Alert & Oriented x 3, Mood appear stable.    Assessment:    Annual wellness medicare exam   Plan:    During the course of the  visit the patient was educated and counseled about appropriate screening and preventive services including:   Annual flu vaccine     Patient Instructions (the written plan) was given to the patient.  Medicare Attestation  I have personally reviewed:  The patient's medical and social history  Their use of alcohol, tobacco or illicit drugs  Their current medications and supplements  The patient's functional ability including ADLs,fall risks, home safety risks, cognitive, and hearing and visual impairment  Diet and physical activities  Evidence for depression or mood disorders  The patient's weight, height, BMI, and visual acuity have been recorded in the chart. I have made referrals, counseling, and provided education to the patient based on review of the above and I have provided the patient with  a written personalized care plan for preventive services.

## 2017-03-18 LAB — CBC WITH DIFFERENTIAL/PLATELET
BASOS ABS: 39 {cells}/uL (ref 0–200)
Basophils Relative: 0.6 %
Eosinophils Absolute: 202 cells/uL (ref 15–500)
Eosinophils Relative: 3.1 %
HCT: 41.3 % (ref 38.5–50.0)
Hemoglobin: 13.9 g/dL (ref 13.2–17.1)
Lymphs Abs: 1521 cells/uL (ref 850–3900)
MCH: 31.6 pg (ref 27.0–33.0)
MCHC: 33.7 g/dL (ref 32.0–36.0)
MCV: 93.9 fL (ref 80.0–100.0)
MPV: 10.6 fL (ref 7.5–12.5)
Monocytes Relative: 9.5 %
NEUTROS PCT: 63.4 %
Neutro Abs: 4121 cells/uL (ref 1500–7800)
PLATELETS: 203 10*3/uL (ref 140–400)
RBC: 4.4 10*6/uL (ref 4.20–5.80)
RDW: 12.7 % (ref 11.0–15.0)
TOTAL LYMPHOCYTE: 23.4 %
WBC: 6.5 10*3/uL (ref 3.8–10.8)
WBCMIX: 618 {cells}/uL (ref 200–950)

## 2017-03-18 LAB — B12 AND FOLATE PANEL: Vitamin B-12: 690 pg/mL (ref 200–1100)

## 2017-03-18 LAB — TSH: TSH: 1.82 mIU/L (ref 0.40–4.50)

## 2017-03-24 ENCOUNTER — Telehealth: Payer: Self-pay | Admitting: Internal Medicine

## 2017-03-24 NOTE — Telephone Encounter (Signed)
Good idea it is a 2 shot series at pharmacy. May pick up Rx here

## 2017-03-24 NOTE — Telephone Encounter (Signed)
Pt is aware.  

## 2017-03-24 NOTE — Telephone Encounter (Signed)
Patient wants to know if he should get the shingles shot.  He has had a minor case of shingles in the past (around his stomach region).  He wants to get it if you think he should.  He forgot to ask this when he was here last week.  Also, he wants to know if his labs are back and get the report on his labs.    Best number for contact:  [phone]  [facility] to speak with wife, [name].

## 2017-03-25 ENCOUNTER — Ambulatory Visit (INDEPENDENT_AMBULATORY_CARE_PROVIDER_SITE_OTHER): Payer: Medicare Other | Admitting: Internal Medicine

## 2017-03-25 DIAGNOSIS — Z23 Encounter for immunization: Secondary | ICD-10-CM | POA: Diagnosis not present

## 2017-03-25 NOTE — Progress Notes (Signed)
Flu vaccine given.

## 2017-04-03 NOTE — Patient Instructions (Signed)
Continue same medications and return in one year or as needed. Continue close cardiology follow-up and neurology follow-up.

## 2017-04-06 ENCOUNTER — Telehealth: Payer: Self-pay | Admitting: Neurology

## 2017-04-06 NOTE — Telephone Encounter (Signed)
Pt would like to know if Dr Rexene Alberts would approve of a Lumin(a CPAP cleaner) this is offered at [facility].  Pt states a discount could be offered if he is able to get a note from his Dr stating it would be beneficial to him.  Pt states if Dr Rexene Alberts would be willing to write a note he would be willing to come and get it or it could be placed in the mail.  Please call either way.

## 2017-04-07 NOTE — Telephone Encounter (Signed)
Left message for patient explaining self cleaning his cpap works for the machine. We do not have documentation from insurance companies that it is beneficial.

## 2017-04-22 ENCOUNTER — Encounter (HOSPITAL_COMMUNITY): Payer: Self-pay | Admitting: Emergency Medicine

## 2017-04-22 ENCOUNTER — Ambulatory Visit (HOSPITAL_COMMUNITY)
Admission: EM | Admit: 2017-04-22 | Discharge: 2017-04-22 | Disposition: A | Discharge status: Home / Self Care | Payer: Medicare Other | Attending: Emergency Medicine | Admitting: Emergency Medicine

## 2017-04-22 ENCOUNTER — Telehealth: Payer: Self-pay

## 2017-04-22 DIAGNOSIS — L03818 Cellulitis of other sites: Secondary | ICD-10-CM | POA: Diagnosis not present

## 2017-04-22 DIAGNOSIS — W5501XA Bitten by cat, initial encounter: Secondary | ICD-10-CM

## 2017-04-22 DIAGNOSIS — W5503XA Scratched by cat, initial encounter: Secondary | ICD-10-CM | POA: Diagnosis not present

## 2017-04-22 MED ORDER — SULFAMETHOXAZOLE-TRIMETHOPRIM 800-160 MG PO TABS: 1.0000 | ORAL_TABLET | Freq: Two times a day (BID) | ORAL | 0 refills | Status: DC

## 2017-04-22 NOTE — Discharge Instructions (Signed)
You should wash and dry area daily Apply small amount of neosporin to area  Circle area daily to ensure the area is not spreading Currently you do not have any fluid under the skin, the area is just red if it begins to spread past the line drawn you may need to go to the ER for further treatment.  Follow up with pcp in a few days

## 2017-04-22 NOTE — Telephone Encounter (Signed)
Please have him go to Urgent care today

## 2017-04-22 NOTE — Telephone Encounter (Signed)
Was bit by his cat on his wrist on Tuesday and it is still "red and puffy" per pt. He said its about the size of a quarter. Cleaning with regular soap and using neosporin and bandaids at night. Would like to know what else can be recommended for him to do to help this? Please advise

## 2017-04-22 NOTE — Telephone Encounter (Signed)
Pt is aware.  

## 2017-04-22 NOTE — ED Triage Notes (Signed)
Pt states his cat bit his R arm Tuesday, tried using neosporin on it. Wound on R arm size of quarter with redness. Cat up to date with all shots.

## 2017-04-22 NOTE — ED Provider Notes (Signed)
[place]    CSN: [id number] Arrival date & time: 04/22/17  1357     History   Chief Complaint Chief Complaint  Patient presents with  . Animal Bite    HPI Robert Ramsey is a 76 y.o. male.   Pt states 3 days ago his cat scratched him on RT fa, states that the area is not getting any large in size, he called his pcp to be seen to ensure it was not infected and they are not able to see him. Denies any n/v/d no fevers. No pain to area. Cat has had shots. Pt has had tetanus up to date. Has been washing and applying neosporin to area.       Past Medical History:  Diagnosis Date  . Abnormal stress test 09/15/10   SEPTAL DEFECT SECONDARY TO PREVIOUS INFARCT VS LBBB; MILD SEPTAL ISCHEMIA  . CAD (coronary artery disease)    cath in 2007 reveals mild diffuse CAD  . Complete heart block [facility]) May 2011   has PTVP in place  . Glucose intolerance (impaired glucose tolerance)   . History of colonoscopy   . HTN (hypertension)   . Memory impairment    better with lower dose statin  . Pacemaker   . PVC's (premature ventricular contractions)   . Ventral hernia     Patient Active Problem List   Diagnosis Date Noted  . Memory loss 01/15/2015  . Excessive daytime sleepiness 01/15/2015  . Hx of partial nephrectomy 09/20/2011  . CAD (coronary artery disease) 02/17/2011  . Hyperlipidemia 02/17/2011  . Pacemaker-Medtronic 02/17/2011  . Complete heart block ([place]) 11/06/2010  . Hypertension 11/06/2010    Past Surgical History:  Procedure Laterality Date  . APPENDECTOMY    . CARDIAC CATHETERIZATION  07/29/2005   EF 50-55%; Has mild diffuse CAD  . cataract surgery  06/2009, 10/2014  . LBBB    . PACEMAKER INSERTION  11/18/09   MDT implanted by Dr Doreatha Lew  . right nephrectomy  01/2006   partial for mass       Home Medications    Prior to Admission medications   Medication Sig Start Date End Date Taking? Authorizing Provider  aspirin 81 MG tablet Take 81 mg by  mouth daily.   Yes [provider]  ezetimibe (ZETIA) 10 MG tablet TAKE 1 TABLET BY MOUTH  DAILY 07/09/16  Yes Allred, Jeneen Rinks, MD  losartan (COZAAR) 25 MG tablet Take 1 tablet (25 mg total) by mouth daily. 10/19/16 04/22/17 Yes Allred, Jeneen Rinks, MD  Multiple Vitamin (MULTIVITAMIN) tablet Take 1 tablet by mouth daily.     Yes [provider]  ranitidine (ZANTAC) 150 MG tablet Take 150 mg by mouth 2 (two) times daily.  02/07/15  Yes [provider]  rosuvastatin (CRESTOR) 5 MG tablet Take 2.5 mg by mouth 3 (three) times a week.    Yes [provider]  triamcinolone ointment (KENALOG) 0.1 % Apply 1 application topically at bedtime as needed. Flare up between fingers 01/07/17  Yes [provider]  VESICARE 10 MG tablet Take 10 mg by mouth daily.  08/22/14  Yes [provider]    Family History Family History  Problem Relation Age of Onset  . Heart failure Father   . Leukemia Mother     Social History Social History  Substance Use Topics  . Smoking status: Former Smoker    Quit date: 07/06/1983  . Smokeless tobacco: Never Used     Comment: stopped smoking in  1985  . Alcohol use No     Allergies   Cinnamon; Flexeril [cyclobenzaprine hcl]; Imodium [loperamide hcl]; Levofloxacin; Nabumetone; Naproxen; Nsaids; Vantin; and Vioxx [rofecoxib]   Review of Systems Review of Systems  Constitutional: Negative.   Respiratory: Negative.   Cardiovascular: Negative.   Gastrointestinal: Negative.   Genitourinary: Negative.   Musculoskeletal: Negative.   Skin: Positive for wound.       Small dime size area to RT FA from cat scratch 3 days ago ,   Neurological: Negative.      Physical Exam Triage Vital Signs ED Triage Vitals [04/22/17 1500]  Enc Vitals Group     BP 126/65     Pulse Rate 70     Resp 16     Temp 98 F (36.7 C)     Temp Source Oral     SpO2 100 %     Weight      Height      Head Circumference      Peak Flow      Pain Score        Pain Loc      Pain Edu?      Excl. in [place]?    No data found.   Updated Vital Signs BP 126/65   Pulse 70   Temp 98 F (36.7 C) (Oral)   Resp 16   SpO2 100%   Visual Acuity     Physical Exam  Constitutional: He appears well-developed.  Cardiovascular: Normal rate and regular rhythm.   Pulmonary/Chest: Effort normal and breath sounds normal.  Abdominal: Soft. Bowel sounds are normal.  Musculoskeletal: Normal range of motion.  Neurological: He is alert.  Skin: Skin is warm. Capillary refill takes less than 2 seconds. There is erythema.     Small dime size erythema to LT FA , no drainage, small 1cm scratch mark from cat 3 days ago. Flat area non-raised,non painful,       UC Treatments / Results  Labs (all labs ordered are listed, but only abnormal results are displayed) Labs Reviewed - No data to display  EKG  EKG Interpretation None       Radiology No results found.  Procedures Procedures (including critical care time)  Medications Ordered in UC Medications - No data to display   Initial Impression / Assessment and Plan / UC Course  I have reviewed the triage vital signs and the nursing notes.  Pertinent labs & imaging results that were available during my care of the patient were reviewed by me and considered in my medical decision making (see chart for details).     You should wash and dry area daily Apply small amount of neosporin to area  Circle area daily to ensure the area is not spreading Currently you do not have any fluid under the skin, the area is just red if it begins to spread past the line drawn you may need to go to the ER for further treatment.  Follow up with pcp in a few days   Final Clinical Impressions(s) / UC Diagnoses   Final diagnoses:  Cat bite, initial encounter  Cellulitis of other specified site    New Prescriptions New Prescriptions   No medications on file     Controlled Substance Prescriptions [place] Controlled  Substance Registry consulted? Not Applicable   Marney Setting, NP 04/22/17 [phone]

## 2017-04-25 ENCOUNTER — Ambulatory Visit (INDEPENDENT_AMBULATORY_CARE_PROVIDER_SITE_OTHER): Payer: Medicare Other | Admitting: Internal Medicine

## 2017-04-25 ENCOUNTER — Encounter: Payer: Self-pay | Admitting: Internal Medicine

## 2017-04-25 VITALS — BP 140/80 | HR 78 | Temp 98.0°F | Ht 67.0 in | Wt 182.1 lb

## 2017-04-25 DIAGNOSIS — W5501XD Bitten by cat, subsequent encounter: Secondary | ICD-10-CM | POA: Diagnosis not present

## 2017-04-25 DIAGNOSIS — I2510 Atherosclerotic heart disease of native coronary artery without angina pectoris: Secondary | ICD-10-CM | POA: Diagnosis not present

## 2017-04-25 NOTE — Progress Notes (Signed)
   Subjective:    Patient ID: Robert Ramsey, male    DOB: 03-Jul-1941, 76 y.o.   MRN: [id number]  HPI 76 year old Male was bitten by his own cat on Friday, October 16.  He called on Friday, October 19 saying the area was red and   he was advised to be seen at the [facility] [place].  He was placed on Bactrim DS.  His tetanus immunization is up-to-date having been given in August 2013.  Cat's  rabies vaccine is also up-to-date.    Review of Systems see above     Objective:   Physical Exam On the dorsal aspect of his right wrist he has a small linear wound approximately 0.75 cm in length.  There is very little surrounding erythema perhaps the size of a dime.  No drainage noted.  Area appears to be healing.       Assessment & Plan:  Bite dorsal aspect right wrist-healing  Plan: Finish course of Bactrim prescribed by urgent care physician.

## 2017-04-25 NOTE — Patient Instructions (Signed)
Finish course of Bactrim prescribed in urgent care center.  Continue to keep area clean and dry.

## 2017-05-03 ENCOUNTER — Ambulatory Visit (INDEPENDENT_AMBULATORY_CARE_PROVIDER_SITE_OTHER): Payer: Medicare Other | Admitting: Internal Medicine

## 2017-05-03 ENCOUNTER — Encounter: Payer: Self-pay | Admitting: Internal Medicine

## 2017-05-03 ENCOUNTER — Telehealth: Payer: Self-pay | Admitting: Internal Medicine

## 2017-05-03 VITALS — BP 120/82 | HR 82 | Temp 97.4°F | Wt 181.0 lb

## 2017-05-03 DIAGNOSIS — J3489 Other specified disorders of nose and nasal sinuses: Secondary | ICD-10-CM

## 2017-05-03 DIAGNOSIS — I2510 Atherosclerotic heart disease of native coronary artery without angina pectoris: Secondary | ICD-10-CM

## 2017-05-03 MED ORDER — MUPIROCIN 2 % EX OINT: 1.0000 "application " | TOPICAL_OINTMENT | Freq: Two times a day (BID) | CUTANEOUS | 1 refills | Status: DC

## 2017-05-03 MED ORDER — DOXYCYCLINE HYCLATE 100 MG PO TABS: 100.0000 mg | ORAL_TABLET | Freq: Two times a day (BID) | ORAL | 0 refills | Status: DC

## 2017-05-03 NOTE — Telephone Encounter (Signed)
OK 

## 2017-05-03 NOTE — Telephone Encounter (Signed)
appt made for today 

## 2017-05-03 NOTE — Patient Instructions (Signed)
Apply Bactroban twice daily for 5 days and take doxycycline 100 mg twice daily for 5 days

## 2017-05-03 NOTE — Progress Notes (Signed)
   Subjective:    Patient ID: Robert Ramsey, male    DOB: 05-Feb-1941, 76 y.o.   MRN: [id number]  HPI He uses an attachment on his razor to help clip nasal hairs.  Recently he has noticed some tenderness in left nostril.  No drainage.  He recently was seen for cat bite and was on Bactrim.  This has cleared up.    Review of Systems see above.  No fever or chills.     Objective:   Physical Exam  He has an abraded area inside his left nostril medial aspect.  There is no definite carbuncle or drainage.      Assessment & Plan:  Abrasion left nostril  Plan: Bactroban ointment apply to left nostril twice daily for 5 days.  Doxycycline 100 mg twice daily for 5 days.

## 2017-05-03 NOTE — Telephone Encounter (Signed)
Patient states that he has a place that has come up inside of his left nostril since he was last here.  He called Dr. Wilhemina Bonito but is unable to get in to see him for a few weeks.  And, states that it is quite painful to the touch.   He's not sure if it's an ingrown hair or a sore or what it is.  He wants to know if you could take a look at it sooner?  Would it be possible for him to come this afternoon?  Thank you.

## 2017-05-11 ENCOUNTER — Ambulatory Visit (INDEPENDENT_AMBULATORY_CARE_PROVIDER_SITE_OTHER): Payer: Medicare Other | Admitting: *Deleted

## 2017-05-11 ENCOUNTER — Telehealth: Payer: Self-pay | Admitting: Cardiology

## 2017-05-11 DIAGNOSIS — I442 Atrioventricular block, complete: Secondary | ICD-10-CM

## 2017-05-11 NOTE — Telephone Encounter (Signed)
Spoke with pt and reminded pt of remote transmission that is due today. Pt verbalized understanding.   

## 2017-05-12 ENCOUNTER — Encounter: Payer: Self-pay | Admitting: Cardiology

## 2017-05-12 NOTE — Progress Notes (Signed)
Remote pacemaker transmission.   

## 2017-05-16 LAB — CUP PACEART REMOTE DEVICE CHECK
Battery Impedance: 439 Ohm
Battery Remaining Longevity: 93 mo
Battery Voltage: 2.78 V
Brady Statistic AP VP Percent: 5 %
Brady Statistic AS VP Percent: 94 %
Brady Statistic AS VS Percent: 1 %
Implantable Lead Implant Date: 20110517
Implantable Lead Location: 753859
Implantable Lead Model: 4469
Implantable Lead Model: 4470
Implantable Lead Serial Number: 543103
Implantable Pulse Generator Implant Date: 20110517
Lead Channel Impedance Value: 640 Ohm
Lead Channel Impedance Value: 707 Ohm
Lead Channel Pacing Threshold Amplitude: 0.375 V
Lead Channel Setting Pacing Amplitude: 2 V
Lead Channel Setting Pacing Amplitude: 2.5 V
Lead Channel Setting Pacing Pulse Width: 0.4 ms
Lead Channel Setting Sensing Sensitivity: 2 mV
MDC IDC LEAD IMPLANT DT: 20110517
MDC IDC LEAD LOCATION: 753860
MDC IDC LEAD SERIAL: 672341
MDC IDC MSMT LEADCHNL RA PACING THRESHOLD PULSEWIDTH: 0.4 ms
MDC IDC MSMT LEADCHNL RV PACING THRESHOLD AMPLITUDE: 0.5 V
MDC IDC MSMT LEADCHNL RV PACING THRESHOLD PULSEWIDTH: 0.4 ms
MDC IDC SESS DTM: 20181107181905
MDC IDC STAT BRADY AP VS PERCENT: 0 %

## 2017-06-05 ENCOUNTER — Other Ambulatory Visit: Payer: Self-pay | Admitting: Internal Medicine

## 2017-07-06 ENCOUNTER — Other Ambulatory Visit: Payer: Self-pay | Admitting: Internal Medicine

## 2017-07-07 MED ORDER — ROSUVASTATIN CALCIUM 5 MG PO TABS: ORAL_TABLET | ORAL | 3 refills | Status: DC

## 2017-07-07 NOTE — Addendum Note (Signed)
Addended by: Derl Barrow on: 07/07/2017 04:38 PM   Modules accepted: Orders

## 2017-08-10 ENCOUNTER — Ambulatory Visit (INDEPENDENT_AMBULATORY_CARE_PROVIDER_SITE_OTHER): Payer: Medicare Other | Admitting: *Deleted

## 2017-08-10 DIAGNOSIS — I442 Atrioventricular block, complete: Secondary | ICD-10-CM

## 2017-08-10 NOTE — Progress Notes (Signed)
Remote pacemaker transmission.   

## 2017-08-11 ENCOUNTER — Encounter: Payer: Self-pay | Admitting: Cardiology

## 2017-08-11 NOTE — Progress Notes (Signed)
Letter  

## 2017-08-15 ENCOUNTER — Encounter: Payer: Medicare Other | Admitting: Internal Medicine

## 2017-08-17 ENCOUNTER — Encounter: Payer: Self-pay | Admitting: Internal Medicine

## 2017-08-17 ENCOUNTER — Encounter (INDEPENDENT_AMBULATORY_CARE_PROVIDER_SITE_OTHER): Payer: Self-pay

## 2017-08-17 ENCOUNTER — Ambulatory Visit (INDEPENDENT_AMBULATORY_CARE_PROVIDER_SITE_OTHER): Payer: Medicare Other | Admitting: Internal Medicine

## 2017-08-17 VITALS — BP 126/74 | HR 76 | Ht 67.0 in | Wt 179.0 lb

## 2017-08-17 DIAGNOSIS — Z950 Presence of cardiac pacemaker: Secondary | ICD-10-CM | POA: Diagnosis not present

## 2017-08-17 DIAGNOSIS — I442 Atrioventricular block, complete: Secondary | ICD-10-CM

## 2017-08-17 DIAGNOSIS — I10 Essential (primary) hypertension: Secondary | ICD-10-CM

## 2017-08-17 LAB — CUP PACEART INCLINIC DEVICE CHECK
Battery Impedance: 463 Ohm
Brady Statistic AP VS Percent: 0 %
Brady Statistic AS VP Percent: 93 %
Brady Statistic AS VS Percent: 1 %
Date Time Interrogation Session: 20190213163007
Implantable Lead Implant Date: 20110517
Implantable Lead Model: 4469
Implantable Lead Model: 4470
Implantable Lead Serial Number: 543103
Lead Channel Impedance Value: 662 Ohm
Lead Channel Impedance Value: 672 Ohm
Lead Channel Pacing Threshold Amplitude: 0.5 V
Lead Channel Pacing Threshold Pulse Width: 0.4 ms
Lead Channel Pacing Threshold Pulse Width: 0.4 ms
Lead Channel Setting Sensing Sensitivity: 2 mV
MDC IDC LEAD IMPLANT DT: 20110517
MDC IDC LEAD LOCATION: 753859
MDC IDC LEAD LOCATION: 753860
MDC IDC LEAD SERIAL: 672341
MDC IDC MSMT BATTERY REMAINING LONGEVITY: 90 mo
MDC IDC MSMT BATTERY VOLTAGE: 2.79 V
MDC IDC MSMT LEADCHNL RA SENSING INTR AMPL: 4 mV
MDC IDC MSMT LEADCHNL RV PACING THRESHOLD AMPLITUDE: 0.75 V
MDC IDC PG IMPLANT DT: 20110517
MDC IDC SET LEADCHNL RA PACING AMPLITUDE: 2 V
MDC IDC SET LEADCHNL RV PACING AMPLITUDE: 2.5 V
MDC IDC SET LEADCHNL RV PACING PULSEWIDTH: 0.4 ms
MDC IDC STAT BRADY AP VP PERCENT: 6 %

## 2017-08-17 NOTE — Progress Notes (Signed)
    PCP: Elby Showers, MD   Primary EP:  Dr Rayann Heman  Robert Ramsey is a 77 y.o. male who presents today for routine electrophysiology followup.  Since last being seen in our clinic, the patient reports doing very well.  Today, he denies symptoms of palpitations, chest pain, shortness of breath,  lower extremity edema, dizziness, presyncope, or syncope.  The patient is otherwise without complaint today.   Past Medical History:  Diagnosis Date  . Abnormal stress test 09/15/10   SEPTAL DEFECT SECONDARY TO PREVIOUS INFARCT VS LBBB; MILD SEPTAL ISCHEMIA  . CAD (coronary artery disease)    cath in 2007 reveals mild diffuse CAD  . Complete heart block [facility]) May 2011   has PTVP in place  . Glucose intolerance (impaired glucose tolerance)   . History of colonoscopy   . HTN (hypertension)   . Memory impairment    better with lower dose statin  . Pacemaker   . PVC's (premature ventricular contractions)   . Ventral hernia    Past Surgical History:  Procedure Laterality Date  . APPENDECTOMY    . CARDIAC CATHETERIZATION  07/29/2005   EF 50-55%; Has mild diffuse CAD  . cataract surgery  06/2009, 10/2014  . LBBB    . PACEMAKER INSERTION  11/18/09   MDT implanted by Dr Doreatha Lew  . right nephrectomy  01/2006   partial for mass    ROS- all systems are reviewed and negative except as per HPI above  Current Outpatient Medications  Medication Sig Dispense Refill  . aspirin 81 MG tablet Take 81 mg by mouth daily.    Marland Kitchen ezetimibe (ZETIA) 10 MG tablet TAKE 1 TABLET BY MOUTH  DAILY 90 tablet 2  . losartan (COZAAR) 25 MG tablet TAKE 1 TABLET BY MOUTH  DAILY 90 tablet 2  . Multiple Vitamin (MULTIVITAMIN) tablet Take 1 tablet by mouth daily.      Marland Kitchen MYRBETRIQ 50 MG TB24 tablet Take 50 mg by mouth daily.    . ranitidine (ZANTAC) 150 MG tablet Take 150 mg by mouth 2 (two) times daily.     . rosuvastatin (CRESTOR) 5 MG tablet Take 1/2 tablet by mouth three times a week. 45 tablet 3   No current  facility-administered medications for this visit.     Physical Exam: Vitals:   08/17/17 1224  BP: 126/74  Pulse: 76  Weight: 179 lb (81.2 kg)  Height: 5\' 7"  (1.702 m)    GEN- The patient is well appearing, alert and oriented x 3 today.   Head- normocephalic, atraumatic Eyes-  Sclera clear, conjunctiva pink Ears- hearing intact Oropharynx- clear Lungs- Clear to ausculation bilaterally, normal work of breathing Chest- pacemaker pocket is well healed Heart- Regular rate and rhythm, no murmurs, rubs or gallops, PMI not laterally displaced GI- soft, NT, ND, + BS Extremities- no clubbing, cyanosis, or edema  Pacemaker interrogation- reviewed in detail today,  See PACEART report  ekg tracing ordered today is personally reviewed and shows AV paced rhythm  Assessment and Plan:  1. Symptomatic complete heart block Normal pacemaker function See Pace Art report No changes today  2. CAD No ischemic symptoms Mild diffuse disease per remote cath  3. HTN Stable No change required today  4. OSA Uses CPAP  5. HL Stable No change required today  Carelink Return to see Cecille Rubin in 6 months I will see in a year  [name], [facility] 08/17/2017 12:48 PM

## 2017-08-17 NOTE — Patient Instructions (Addendum)
Medication Instructions:  Your physician recommends that you continue on your current medications as directed. Please refer to the Current Medication list given to you today.  *If you need a refill on your cardiac medications before your next appointment, please call your pharmacy*  Labwork: None ordered  Testing/Procedures: None ordered  Follow-Up: Remote monitoring is used to monitor your Pacemaker or ICD from home. This monitoring reduces the number of office visits required to check your device to one time per year. It allows [place] to keep an eye on the functioning of your device to ensure it is working properly. You are scheduled for a device check from home on 11/09/2017. You may send your transmission at any time that day. If you have a wireless device, the transmission will be sent automatically. After your physician reviews your transmission, you will receive a postcard with your next transmission date.  Your physician wants you to follow-up in: 6 months with Truitt Merle, NP. You will receive a reminder letter in the mail two months in advance. If you don't receive a letter, please call our office to schedule the follow-up appointment.  Your physician wants you to follow-up in: 1 year with Dr. Rayann Heman.  You will receive a reminder letter in the mail two months in advance. If you don't receive a letter, please call our office to schedule the follow-up appointment.  Thank you for choosing [facility]!!

## 2017-09-01 LAB — CUP PACEART REMOTE DEVICE CHECK
Battery Impedance: 511 Ohm
Brady Statistic AP VP Percent: 6 %
Brady Statistic AP VS Percent: 0 %
Brady Statistic AS VP Percent: 93 %
Brady Statistic AS VS Percent: 1 %
Date Time Interrogation Session: 20190206154109
Implantable Lead Implant Date: 20110517
Implantable Lead Location: 753859
Implantable Lead Model: 4469
Implantable Lead Model: 4470
Implantable Lead Serial Number: 543103
Implantable Lead Serial Number: 672341
Lead Channel Impedance Value: 705 Ohm
Lead Channel Pacing Threshold Amplitude: 0.375 V
Lead Channel Pacing Threshold Pulse Width: 0.4 ms
Lead Channel Setting Pacing Amplitude: 2.5 V
MDC IDC LEAD IMPLANT DT: 20110517
MDC IDC LEAD LOCATION: 753860
MDC IDC MSMT BATTERY REMAINING LONGEVITY: 87 mo
MDC IDC MSMT BATTERY VOLTAGE: 2.79 V
MDC IDC MSMT LEADCHNL RA IMPEDANCE VALUE: 684 Ohm
MDC IDC MSMT LEADCHNL RV PACING THRESHOLD AMPLITUDE: 0.5 V
MDC IDC MSMT LEADCHNL RV PACING THRESHOLD PULSEWIDTH: 0.4 ms
MDC IDC PG IMPLANT DT: 20110517
MDC IDC SET LEADCHNL RA PACING AMPLITUDE: 2 V
MDC IDC SET LEADCHNL RV PACING PULSEWIDTH: 0.4 ms
MDC IDC SET LEADCHNL RV SENSING SENSITIVITY: 2 mV

## 2017-09-05 DIAGNOSIS — H524 Presbyopia: Secondary | ICD-10-CM | POA: Diagnosis not present

## 2017-09-05 DIAGNOSIS — H353132 Nonexudative age-related macular degeneration, bilateral, intermediate dry stage: Secondary | ICD-10-CM | POA: Diagnosis not present

## 2017-09-05 DIAGNOSIS — H5310 Unspecified subjective visual disturbances: Secondary | ICD-10-CM | POA: Diagnosis not present

## 2017-09-05 DIAGNOSIS — H5000 Unspecified esotropia: Secondary | ICD-10-CM | POA: Diagnosis not present

## 2017-09-07 ENCOUNTER — Telehealth: Payer: Self-pay

## 2017-09-07 NOTE — Telephone Encounter (Signed)
Pt called about the losartan recall and has some question contant information is [phone].

## 2017-09-07 NOTE — Telephone Encounter (Signed)
Returned Pt call.  Per Pt [facility] had called him and told him that his losartan was not part of the recall and was ok to take.  Pt wanted a 2nd opinion.  Educated pt that a small amount of losartan with specific lot  Numbers was tainted, and if [facility] states his was not part of the recall he was ok.   Pt indicates understanding.

## 2017-10-12 DIAGNOSIS — Z86010 Personal history of colonic polyps: Secondary | ICD-10-CM | POA: Diagnosis not present

## 2017-10-12 DIAGNOSIS — K5901 Slow transit constipation: Secondary | ICD-10-CM | POA: Diagnosis not present

## 2017-10-12 DIAGNOSIS — K210 Gastro-esophageal reflux disease with esophagitis: Secondary | ICD-10-CM | POA: Diagnosis not present

## 2017-11-09 ENCOUNTER — Ambulatory Visit (INDEPENDENT_AMBULATORY_CARE_PROVIDER_SITE_OTHER): Payer: Medicare Other | Admitting: *Deleted

## 2017-11-09 DIAGNOSIS — I442 Atrioventricular block, complete: Secondary | ICD-10-CM | POA: Diagnosis not present

## 2017-11-09 NOTE — Progress Notes (Signed)
Remote pacemaker transmission.   

## 2017-11-10 ENCOUNTER — Encounter: Payer: Self-pay | Admitting: Cardiology

## 2017-11-10 ENCOUNTER — Telehealth: Payer: Self-pay | Admitting: Internal Medicine

## 2017-11-10 NOTE — Telephone Encounter (Signed)
Robert Ramsey Self [phone]  Paul called to say that on Monday he scraped his arm on a cabinet in the kitchen, hr cleaned it and bandaged it when it happened. When he took off bandage it pulled skin back and has been bleeding, he stated it does not look infected, no fever, chills. He is just wondering if you should look at it or should he go to ED or keep a bandage on it or leave it open and give it a few more days. Treyven also stated that he takes an aspirin every day.

## 2017-11-10 NOTE — Telephone Encounter (Signed)
APPT WAS MADE FOR TOMORROW, PATIENT IS AWARE.

## 2017-11-10 NOTE — Telephone Encounter (Signed)
We can see it tomorrow.

## 2017-11-11 ENCOUNTER — Ambulatory Visit (INDEPENDENT_AMBULATORY_CARE_PROVIDER_SITE_OTHER): Payer: Medicare Other | Admitting: Internal Medicine

## 2017-11-11 ENCOUNTER — Encounter: Payer: Self-pay | Admitting: Internal Medicine

## 2017-11-11 VITALS — BP 120/82 | HR 80 | Temp 98.3°F | Ht 67.0 in | Wt 179.0 lb

## 2017-11-11 DIAGNOSIS — S50812A Abrasion of left forearm, initial encounter: Secondary | ICD-10-CM

## 2017-11-11 MED ORDER — MUPIROCIN 2 % EX OINT: 1.0000 "application " | TOPICAL_OINTMENT | Freq: Two times a day (BID) | CUTANEOUS | 0 refills | Status: DC

## 2017-11-11 NOTE — Patient Instructions (Signed)
Clean wound with peroxide twice daily.  Dry carefully.  Try Bactroban ointment sparingly to the abraded area twice daily then wrapped with Telfa and then Kling until healed.  Call if not healing well.

## 2017-11-11 NOTE — Progress Notes (Signed)
   Subjective:    Patient ID: Robert Ramsey, male    DOB: 01-15-1941, 77 y.o.   MRN: [id number]  HPI He struck his left arm on a kitchen island Tuesday, May 7.  He became alarmed because his wife removed the bandage and the abrasion bled.  She has been keeping it covered with a nonstick adhesive bandage.  She is been applying Neosporin ointment.  His tetanus immunization is up-to-date have not been given in 2013.    Review of Systems     Objective:   Physical Exam  Approximately 2.5 cm superficial abrasion of left forearm dorsal aspect.  There is some slight erythema surrounding the abraded area but I do not think it is infected.  The wound is clean.      Assessment & Plan:  The wound was dressed in a sterile fashion using Bactroban ointment sparingly and then Telfa applied and wrapped with Kling.  Wife was advised to perform the same treatment  twice daily and to clean wound carefully with peroxide but not scrub the area.  He may get in the shower and clean the area lightly with soap and water prior to having it dressed.  No oral antibiotics were prescribed.

## 2017-11-23 ENCOUNTER — Ambulatory Visit (INDEPENDENT_AMBULATORY_CARE_PROVIDER_SITE_OTHER): Payer: Medicare Other | Admitting: Diagnostic Neuroimaging

## 2017-11-23 ENCOUNTER — Ambulatory Visit: Payer: Medicare Other | Admitting: Diagnostic Neuroimaging

## 2017-11-23 ENCOUNTER — Encounter: Payer: Self-pay | Admitting: Diagnostic Neuroimaging

## 2017-11-23 VITALS — BP 122/71 | HR 72 | Ht 67.0 in | Wt 175.8 lb

## 2017-11-23 DIAGNOSIS — G4733 Obstructive sleep apnea (adult) (pediatric): Secondary | ICD-10-CM

## 2017-11-23 DIAGNOSIS — Z9989 Dependence on other enabling machines and devices: Secondary | ICD-10-CM

## 2017-11-23 DIAGNOSIS — R413 Other amnesia: Secondary | ICD-10-CM

## 2017-11-23 DIAGNOSIS — G3184 Mild cognitive impairment, so stated: Secondary | ICD-10-CM
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
## 2017-12-06 LAB — CUP PACEART REMOTE DEVICE CHECK
Battery Impedance: 560 Ohm
Battery Remaining Longevity: 82 mo
Battery Voltage: 2.79 V
Brady Statistic AP VP Percent: 5 %
Brady Statistic AP VS Percent: 0 %
Brady Statistic AS VP Percent: 95 %
Brady Statistic AS VS Percent: 0 %
Date Time Interrogation Session: 20190508123641
Implantable Lead Implant Date: 20110517
Implantable Lead Implant Date: 20110517
Implantable Lead Location: 753859
Implantable Lead Location: 753860
Implantable Lead Model: 4469
Implantable Lead Model: 4470
Implantable Lead Serial Number: 543103
Implantable Lead Serial Number: 672341
Implantable Pulse Generator Implant Date: 20110517
Lead Channel Impedance Value: 630 Ohm
Lead Channel Impedance Value: 670 Ohm
Lead Channel Pacing Threshold Amplitude: 0.375 V
Lead Channel Pacing Threshold Amplitude: 0.5 V
Lead Channel Pacing Threshold Pulse Width: 0.4 ms
Lead Channel Pacing Threshold Pulse Width: 0.4 ms
Lead Channel Setting Pacing Amplitude: 2 V
Lead Channel Setting Pacing Amplitude: 2.5 V
Lead Channel Setting Pacing Pulse Width: 0.4 ms
Lead Channel Setting Sensing Sensitivity: 2 mV

## 2017-12-12 ENCOUNTER — Encounter: Payer: Self-pay | Admitting: Adult Health

## 2017-12-12 ENCOUNTER — Ambulatory Visit (INDEPENDENT_AMBULATORY_CARE_PROVIDER_SITE_OTHER): Payer: Medicare Other | Admitting: Adult Health

## 2017-12-12 VITALS — BP 117/68 | HR 71 | Ht 67.0 in | Wt 178.2 lb

## 2017-12-12 DIAGNOSIS — Z9989 Dependence on other enabling machines and devices: Secondary | ICD-10-CM

## 2017-12-12 DIAGNOSIS — G4733 Obstructive sleep apnea (adult) (pediatric): Secondary | ICD-10-CM | POA: Diagnosis not present

## 2017-12-12 NOTE — Progress Notes (Addendum)
PATIENT: Robert Ramsey DOB: 28-Dec-1940  REASON FOR VISIT: follow up HISTORY FROM: patient  HISTORY OF PRESENT ILLNESS: Today 12/12/17:  Robert Ramsey is a 77 year old male with a history of obstructive sleep apnea on CPAP.  His CPAP download indicates that he use his machine nightly for compliance of 100%.  He uses machine greater than 4 hours each night.  On average he uses his machine 7 hours and 49 minutes.  His residual AHI is 0.2 on 9 mm of water with EPR 3.  He does not have a significant leak.  Overall the patient has done well.  His Epworth sleepiness score is 9.  He feels that the CPAP continues to work well for him.  He recently saw Dr. Leta Ramsey and his memory score has remained stable.  He returns today for evaluation.  HISTORY 12/09/2016 (Copied From Robert Ramsey note):  I reviewed his CPAP compliance data from 11/08/2016 through 12/07/2016 which is a total of 30 days, during which time he used his CPAP every night with percent used days greater than 4 hours at 100%, indicating superb compliance with an average usage of 7 hours and 43 minutes, residual AHI 0.2 per hour, leak acceptable with the 95th percentile at 9 L/m on a pressure of 9 cm with EPR of 3. He reports doing well, BP stable, weight stable, had cardiology FU in 2/18, goes q 6 mo. He was seen recently by Dr. Leta Ramsey on 11/22/2016 and I reviewed the note. He was doing well enough with regards to his mild cognitive impairment for a one-year checkup. MMSE was 28.   REVIEW OF SYSTEMS: Out of a complete 14 system review of symptoms, the patient complains only of the following symptoms, and all other reviewed systems are negative.  See HPI  ALLERGIES: Allergies  Allergen Reactions  . Acyclovir And Related   . Cinnamon     Cinnamon toothpaste caused him to break out inside his mouth and lesions were removed  . Flexeril [Cyclobenzaprine Hcl]     Possible heart side effects  . Imodium [Loperamide Hcl]     unknown  .  Levofloxacin Other (See Comments)    Elevated heart rate  . Nabumetone     REACTION: Heart issues/hospital admission  . Naproxen     Effects blood pressure and heart  . Nsaids     Effects blood pressure and heart  . Vantin     Pseudo colitis  . Vioxx [Rofecoxib]     Effects blood pressure and heart    HOME MEDICATIONS: Outpatient Medications Prior to Visit  Medication Sig Dispense Refill  . aspirin 81 MG tablet Take 81 mg by mouth daily.    Marland Kitchen ezetimibe (ZETIA) 10 MG tablet TAKE 1 TABLET BY MOUTH  DAILY 90 tablet 2  . losartan (COZAAR) 25 MG tablet TAKE 1 TABLET BY MOUTH  DAILY 90 tablet 2  . Multiple Vitamin (MULTIVITAMIN) tablet Take 1 tablet by mouth daily.      . mupirocin ointment (BACTROBAN) 2 % Apply 1 application topically 2 (two) times daily. 22 g 0  . MYRBETRIQ 50 MG TB24 tablet Take 50 mg by mouth daily.    . ranitidine (ZANTAC) 150 MG tablet Take 150 mg by mouth 2 (two) times daily.     . rosuvastatin (CRESTOR) 5 MG tablet Take 1/2 tablet by mouth three times a week. 45 tablet 3   No facility-administered medications prior to visit.     PAST MEDICAL HISTORY: Past  Medical History:  Diagnosis Date  . Abnormal stress test 09/15/10   SEPTAL DEFECT SECONDARY TO PREVIOUS INFARCT VS LBBB; MILD SEPTAL ISCHEMIA  . CAD (coronary artery disease)    cath in 2007 reveals mild diffuse CAD  . Complete heart block [facility]) May 2011   has PTVP in place  . Glucose intolerance (impaired glucose tolerance)   . History of colonoscopy   . HTN (hypertension)   . Memory impairment    better with lower dose statin  . Pacemaker   . PVC's (premature ventricular contractions)   . Ventral hernia     PAST SURGICAL HISTORY: Past Surgical History:  Procedure Laterality Date  . APPENDECTOMY    . CARDIAC CATHETERIZATION  07/29/2005   EF 50-55%; Has mild diffuse CAD  . cataract surgery  06/2009, 10/2014  . LBBB    . PACEMAKER INSERTION  11/18/09   MDT implanted by Dr Doreatha Lew  . right  nephrectomy  01/2006   partial for mass    FAMILY HISTORY: Family History  Problem Relation Age of Onset  . Heart failure Father   . Leukemia Mother     SOCIAL HISTORY: Social History   Socioeconomic History  . Marital status: Married    Spouse name: Robert Ramsey  . Number of children: 4  . Years of education: 60  . Highest education level: Not on file  Occupational History  . Occupation: Retired    Comment: [employer]  Social Needs  . Financial resource strain: Not on file  . Food insecurity:    Worry: Not on file    Inability: Not on file  . Transportation needs:    Medical: Not on file    Non-medical: Not on file  Tobacco Use  . Smoking status: Former Smoker    Last attempt to quit: 07/06/1983    Years since quitting: 34.4  . Smokeless tobacco: Never Used  . Tobacco comment: stopped smoking in 1985  Substance and Sexual Activity  . Alcohol use: No  . Drug use: No  . Sexual activity: Not Currently  Lifestyle  . Physical activity:    Days per week: Not on file    Minutes per session: Not on file  . Stress: Not on file  Relationships  . Social connections:    Talks on phone: Not on file    Gets together: Not on file    Attends religious service: Not on file    Active member of club or organization: Not on file    Attends meetings of clubs or organizations: Not on file    Relationship status: Not on file  . Intimate partner violence:    Fear of current or ex partner: Not on file    Emotionally abused: Not on file    Physically abused: Not on file    Forced sexual activity: Not on file  Other Topics Concern  . Not on file  Social History Narrative   Retired from [employer], [employer].   Lives at home with wife   Caffeine use- morning coffee and 2 cup/ 1 cup of tea at lunch       PHYSICAL EXAM  Vitals:   12/12/17 1112  BP: 117/68  Pulse: 71  Weight: 178 lb 3.2 oz (80.8 kg)  Height: 5\' 7"  (1.702 m)   Body mass index is 27.91 kg/m.  Generalized: Well  developed, in no acute distress   Neurological examination  Mentation: Alert oriented to time, place, history taking. Follows all commands  speech and language fluent Cranial nerve II-XII: Pupils were equal round reactive to light. Extraocular movements were full, visual field were full on confrontational test. Facial sensation and strength were normal. Uvula tongue midline. Head turning and shoulder shrug  were normal and symmetric. Motor: The motor testing reveals 5 over 5 strength of all 4 extremities. Good symmetric motor tone is noted throughout.  Sensory: Sensory testing is intact to soft touch on all 4 extremities. No evidence of extinction is noted.  Coordination: Cerebellar testing reveals good finger-nose-finger and heel-to-shin bilaterally.  Gait and station: Gait is normal.  Reflexes: Deep tendon reflexes are symmetric and normal bilaterally.   DIAGNOSTIC DATA (LABS, IMAGING, TESTING) - I reviewed patient records, labs, notes, testing and imaging myself where available.  Lab Results  Component Value Date   WBC 6.5 03/17/2017   HGB 13.9 03/17/2017   HCT 41.3 03/17/2017   MCV 93.9 03/17/2017   PLT 203 03/17/2017      Component Value Date/Time   NA 141 02/09/2017 1151   K 4.8 02/09/2017 1151   CL 105 02/09/2017 1151   CO2 22 02/09/2017 1151   GLUCOSE 91 02/09/2017 1151   GLUCOSE 77 11/06/2015 1051   BUN 18 02/09/2017 1151   CREATININE 0.92 02/09/2017 1151   CREATININE 0.91 11/06/2015 1051   CALCIUM 10.0 02/09/2017 1151   PROT 6.4 02/09/2017 1151   ALBUMIN 4.2 02/09/2017 1151   AST 16 02/09/2017 1151   ALT 16 02/09/2017 1151   ALKPHOS 55 02/09/2017 1151   BILITOT 0.4 02/09/2017 1151   GFRNONAA 80 02/09/2017 1151   GFRNONAA 82 11/06/2015 1051   GFRAA 92 02/09/2017 1151   GFRAA >89 11/06/2015 1051   Lab Results  Component Value Date   CHOL 116 02/09/2017   HDL 47 02/09/2017   LDLCALC 52 02/09/2017   TRIG 86 02/09/2017   CHOLHDL 2.5 02/09/2017   Lab Results    Component Value Date   HGBA1C 5.8 (H) 12/16/2014   Lab Results  Component Value Date   VITAMINB12 690 03/17/2017   Lab Results  Component Value Date   TSH 1.82 03/17/2017      ASSESSMENT AND PLAN 77 y.o. year old male  has a past medical history of Abnormal stress test (09/15/10), CAD (coronary artery disease), Complete heart block [facility]) (May 2011), Glucose intolerance (impaired glucose tolerance), History of colonoscopy, HTN (hypertension), Memory impairment, Pacemaker, PVC's (premature ventricular contractions), and Ventral hernia. here with:  1.  Obstructive sleep apnea on CPAP  Overall the patient is doing well.  His CPAP download shows excellent compliance and good treatment of his apnea.  He is encouraged to use the CPAP nightly.  He is advised that if his symptoms worsen or he develops new symptoms he should let us know.  He will follow-up in 1 year.  At the next visit we will review CPAP download as well as memory testing.  I spent 15 minutes with the patient. 50% of this time was spent reviewing his CPAP download   Ward Givens, MSN, NP-C 12/12/2017, 11:22 AM [facility] [address], [place], [place] [address] [phone]  I reviewed the above note and documentation by the Nurse Practitioner and agree with the history, physical exam, assessment and plan as outlined above. I was immediately available for face-to-face consultation. Star Age, MD, PhD [facility])

## 2017-12-12 NOTE — Patient Instructions (Signed)
Your Plan:  Continue using CPAP nightly and >4 hours each night If your symptoms worsen or you develop new symptoms please let us know.   Thank you for coming to see us at [facility]. I hope we have been able to provide you high quality care today.  You may receive a patient satisfaction survey over the next few weeks. We would appreciate your feedback and comments so that we may continue to improve ourselves and the health of our patients.  

## 2017-12-29 ENCOUNTER — Other Ambulatory Visit: Payer: Self-pay | Admitting: Internal Medicine

## 2017-12-29 DIAGNOSIS — Z0000 Encounter for general adult medical examination without abnormal findings: Secondary | ICD-10-CM

## 2017-12-29 DIAGNOSIS — Z950 Presence of cardiac pacemaker: Secondary | ICD-10-CM

## 2017-12-29 DIAGNOSIS — G4730 Sleep apnea, unspecified: Secondary | ICD-10-CM

## 2017-12-29 DIAGNOSIS — M19042 Primary osteoarthritis, left hand: Secondary | ICD-10-CM

## 2017-12-29 DIAGNOSIS — I10 Essential (primary) hypertension: Secondary | ICD-10-CM

## 2017-12-29 DIAGNOSIS — R413 Other amnesia: Secondary | ICD-10-CM

## 2017-12-29 DIAGNOSIS — Z125 Encounter for screening for malignant neoplasm of prostate: Secondary | ICD-10-CM

## 2017-12-29 DIAGNOSIS — E7849 Other hyperlipidemia: Secondary | ICD-10-CM

## 2017-12-29 DIAGNOSIS — R7302 Impaired glucose tolerance (oral): Secondary | ICD-10-CM

## 2018-01-26 ENCOUNTER — Encounter: Payer: Self-pay | Admitting: Internal Medicine

## 2018-01-26 DIAGNOSIS — R972 Elevated prostate specific antigen [PSA]: Secondary | ICD-10-CM | POA: Diagnosis not present

## 2018-02-02 DIAGNOSIS — R350 Frequency of micturition: Secondary | ICD-10-CM | POA: Diagnosis not present

## 2018-02-02 DIAGNOSIS — N402 Nodular prostate without lower urinary tract symptoms: Secondary | ICD-10-CM | POA: Diagnosis not present

## 2018-02-08 ENCOUNTER — Ambulatory Visit (INDEPENDENT_AMBULATORY_CARE_PROVIDER_SITE_OTHER): Payer: Medicare Other | Admitting: *Deleted

## 2018-02-08 DIAGNOSIS — I442 Atrioventricular block, complete: Secondary | ICD-10-CM

## 2018-02-08 DIAGNOSIS — I441 Atrioventricular block, second degree: Secondary | ICD-10-CM

## 2018-02-09 NOTE — Progress Notes (Signed)
Remote pacemaker transmission.   

## 2018-02-15 ENCOUNTER — Encounter (INDEPENDENT_AMBULATORY_CARE_PROVIDER_SITE_OTHER): Payer: Self-pay

## 2018-02-15 ENCOUNTER — Ambulatory Visit (INDEPENDENT_AMBULATORY_CARE_PROVIDER_SITE_OTHER): Payer: Medicare Other | Admitting: Nurse Practitioner

## 2018-02-15 ENCOUNTER — Encounter: Payer: Self-pay | Admitting: Nurse Practitioner

## 2018-02-15 VITALS — BP 122/70 | HR 68 | Ht 69.0 in | Wt 171.4 lb

## 2018-02-15 DIAGNOSIS — E7849 Other hyperlipidemia: Secondary | ICD-10-CM | POA: Diagnosis not present

## 2018-02-15 DIAGNOSIS — Z950 Presence of cardiac pacemaker: Secondary | ICD-10-CM

## 2018-02-15 DIAGNOSIS — I2510 Atherosclerotic heart disease of native coronary artery without angina pectoris: Secondary | ICD-10-CM

## 2018-02-15 DIAGNOSIS — I10 Essential (primary) hypertension: Secondary | ICD-10-CM

## 2018-02-15 DIAGNOSIS — R079 Chest pain, unspecified: Secondary | ICD-10-CM

## 2018-02-15 NOTE — Progress Notes (Signed)
CARDIOLOGY OFFICE NOTE  Date:  02/15/2018    Robert Ramsey Date of Birth: 1941/02/04 Medical Record #[id number]  PCP:  Elby Showers, MD  Cardiologist:  [place]  Chief Complaint  Patient presents with  . Coronary Artery Disease  . Hypertension  . Hyperlipidemia    Follow up visit - seen for Dr. Rayann Heman    History of Present Illness: Robert Ramsey is a 77 y.o. male who presents today for a follow up visit. Seen for Dr. Rayann Heman. He is a former patient of Dr. Susa Simmonds.   He has mild CAD, HTN, HLD and pacemaker in place for Mobitz II AV block. Tolerates only low dose statin due to memory issues. He has had remote cath from 2007 showing mild diffuse CAD. He has had prior right partial nephrectomy for a renal mass by Dr. Karsten Ro back in 2012. He has had some progressive memory issues -  No longer on beta blocker.   Last seen by Dr. Rayann Heman in February.   Comes in today. Here with his wife. He notes "breast bone pain" with flexing and notes this with mashing on his sternum and with movement. Some radiation to back - but notes this with movement/twisting/turning as well. He otherwise feels like he is doing well.  EKG is paced here today. He has cut back on sweets/calories and has lost some weight intentionally. She notes he does eat as much as he has in the past.  They are considering moving to a retirement center. She is worried about the distance from [facility]. He is fasting today and would like his labs drawn here today. Having his physical next month with Dr. Renold Genta.   Past Medical History:  Diagnosis Date  . Abdominal pain   . Abnormal stress test 09/15/10   SEPTAL DEFECT SECONDARY TO PREVIOUS INFARCT VS LBBB; MILD SEPTAL ISCHEMIA  . Acid reflux   . CAD (coronary artery disease)    cath in 2007 reveals mild diffuse CAD  . Colon polyps   . Complete heart block [facility]) May 2011   has PTVP in place  . Diverticulosis   . Glucose intolerance (impaired glucose tolerance)    . Hemorrhoids   . Hiatal hernia   . History of colonoscopy   . HTN (hypertension)   . Hypercholesterolemia   . Memory impairment    better with lower dose statin  . Pacemaker   . PVC's (premature ventricular contractions)   . Sinusitis   . Sleep apnea   . Ventral hernia     Past Surgical History:  Procedure Laterality Date  . APPENDECTOMY    . CARDIAC CATHETERIZATION  07/29/2005   EF 50-55%; Has mild diffuse CAD  . cataract surgery  06/2009, 10/2014  . COLONOSCOPY    . LBBB    . NOSE SURGERY    . PACEMAKER INSERTION  11/18/09   MDT implanted by Dr Doreatha Lew  . right nephrectomy  01/2006   partial for mass     Medications: Current Meds  Medication Sig  . aspirin 81 MG tablet Take 81 mg by mouth daily.  Marland Kitchen ezetimibe (ZETIA) 10 MG tablet TAKE 1 TABLET BY MOUTH  DAILY  . losartan (COZAAR) 25 MG tablet TAKE 1 TABLET BY MOUTH  DAILY  . Multiple Vitamin (MULTIVITAMIN) tablet Take 1 tablet by mouth daily.    . Multiple Vitamins-Minerals (PRESERVISION AREDS) TABS Take 1 tablet by mouth 2 (two) times daily.  Marland Kitchen MYRBETRIQ 50 MG TB24  tablet Take 50 mg by mouth daily.  . ranitidine (ZANTAC) 150 MG tablet Take 150 mg by mouth 2 (two) times daily.   . rosuvastatin (CRESTOR) 5 MG tablet Take 1/2 tablet by mouth three times a week.     Allergies: Allergies  Allergen Reactions  . Acyclovir And Related   . Cinnamon     Cinnamon toothpaste caused him to break out inside his mouth and lesions were removed  . Flexeril [Cyclobenzaprine Hcl]     Possible heart side effects  . Imodium [Loperamide Hcl]     unknown  . Levofloxacin Other (See Comments)    Elevated heart rate  . Nabumetone     REACTION: Heart issues/hospital admission  . Naproxen     Effects blood pressure and heart  . Nsaids     Effects blood pressure and heart  . Vantin     Pseudo colitis  . Vioxx [Rofecoxib]     Effects blood pressure and heart    Social History: The patient  reports that he quit smoking about  34 years ago. He has never used smokeless tobacco. He reports that he does not drink alcohol or use drugs.   Family History: The patient's family history includes Heart failure in his father; Leukemia in his mother.   Review of Systems: Please see the history of present illness.   Otherwise, the review of systems is positive for none.   All other systems are reviewed and negative.   Physical Exam: VS:  BP 122/70 (BP Location: Left Arm, Patient Position: Sitting, Cuff Size: Normal)   Pulse 68   Ht 5\' 9"  (1.753 m)   Wt 171 lb 6.4 oz (77.7 kg)   BMI 25.31 kg/m  .  BMI Body mass index is 25.31 kg/m.  Wt Readings from Last 3 Encounters:  02/15/18 171 lb 6.4 oz (77.7 kg)  12/12/17 178 lb 3.2 oz (80.8 kg)  11/23/17 175 lb 12.8 oz (79.7 kg)    General: Alert and in no acute distress.   HEENT: Normal.  Neck: Supple, no JVD, carotid bruits, or masses noted.  Cardiac: Regular rate and rhythm. Heart tones are distant.  No edema.  Respiratory:  Lungs are clear to auscultation bilaterally with normal work of breathing.  GI: Soft and nontender.  MS: No deformity or atrophy. Gait and ROM intact.  Skin: Warm and dry. Color is normal.  Neuro:  Strength and sensation are intact and no gross focal deficits noted.  Psych: Alert, appropriate and with normal affect.   LABORATORY DATA:  EKG:  EKG is ordered today. This demonstrates AV pacing.  Lab Results  Component Value Date   WBC 6.5 03/17/2017   HGB 13.9 03/17/2017   HCT 41.3 03/17/2017   PLT 203 03/17/2017   GLUCOSE 91 02/09/2017   CHOL 116 02/09/2017   TRIG 86 02/09/2017   HDL 47 02/09/2017   LDLCALC 52 02/09/2017   ALT 16 02/09/2017   AST 16 02/09/2017   NA 141 02/09/2017   K 4.8 02/09/2017   CL 105 02/09/2017   CREATININE 0.92 02/09/2017   BUN 18 02/09/2017   CO2 22 02/09/2017   TSH 1.82 03/17/2017   HGBA1C 5.8 (H) 12/16/2014     BNP (last 3 results) No results for input(s): BNP in the last 8760 hours.  ProBNP (last  3 results) No results for input(s): PROBNP in the last 8760 hours.   Other Studies Reviewed Today:   Assessment/Plan:  1. Atypical chest pain -  seems more musculoskeletal to me. No exertional symptoms.   2. Mild CAD per remote cath - managed medically.   3. Underlying PPM - seeing EP in February of 2020  4. HTN - BP is doing well  5. HLD - lab today  6. Past renal mass - followed by GU - not discussed today.    7. Progressive memory issues - not discussed. They are considering downsizing and moving now that they are both getting older.   Current medicines are reviewed with the patient today.  The patient does not have concerns regarding medicines other than what has been noted above.  The following changes have been made:  See above.  Labs/ tests ordered today include:    Orders Placed This Encounter  Procedures  . Basic metabolic panel  . CBC  . Hepatic function panel  . Lipid panel  . TSH  . EKG 12-Lead     Disposition:   FU with Dr. Rayann Heman in 6 months.    Patient is agreeable to this plan and will call if any problems develop in the interim.   SignedTruitt Merle, NP  02/15/2018 11:16 AM  [facility] [address] [place], [place]  [address] Phone: [phone] Fax: [phone]

## 2018-02-15 NOTE — Patient Instructions (Addendum)
We will be checking the following labs today - BMET, CBC, HPF, TSH and lipids   Medication Instructions:    Continue with your current medicines.     Testing/Procedures To Be Arranged:  N/A  Follow-Up:   See Dr. Rayann Heman in February    Other Special Instructions:   N/A    If you need a refill on your cardiac medications before your next appointment, please call your pharmacy.   Call the [place] office at [phone] if you have any questions, problems or concerns.

## 2018-02-16 LAB — BASIC METABOLIC PANEL
BUN/Creatinine Ratio: 25 — ABNORMAL HIGH (ref 10–24)
BUN: 22 mg/dL (ref 8–27)
CO2: 20 mmol/L (ref 20–29)
Calcium: 9.9 mg/dL (ref 8.6–10.2)
Chloride: 105 mmol/L (ref 96–106)
Creatinine, Ser: 0.89 mg/dL (ref 0.76–1.27)
GFR calc Af Amer: 95 mL/min/{1.73_m2} (ref >59)
GFR calc non Af Amer: 82 mL/min/{1.73_m2} (ref >59)
Glucose: 84 mg/dL (ref 65–99)
Potassium: 4.9 mmol/L (ref 3.5–5.2)
Sodium: 143 mmol/L (ref 134–144)

## 2018-02-16 LAB — HEPATIC FUNCTION PANEL
ALT: 16 IU/L (ref 0–44)
AST: 21 IU/L (ref 0–40)
Albumin: 4.4 g/dL (ref 3.5–4.8)
Alkaline Phosphatase: 62 IU/L (ref 39–117)
Bilirubin Total: 0.4 mg/dL (ref 0.0–1.2)
Bilirubin, Direct: 0.12 mg/dL (ref 0.00–0.40)
Total Protein: 7 g/dL (ref 6.0–8.5)

## 2018-02-16 LAB — LIPID PANEL
Chol/HDL Ratio: 2.5 ratio (ref 0.0–5.0)
Cholesterol, Total: 131 mg/dL (ref 100–199)
HDL: 53 mg/dL (ref >39)
LDL Calculated: 62 mg/dL (ref 0–99)
Triglycerides: 82 mg/dL (ref 0–149)
VLDL Cholesterol Cal: 16 mg/dL (ref 5–40)

## 2018-02-16 LAB — CBC
Hematocrit: 42.9 % (ref 37.5–51.0)
Hemoglobin: 14.4 g/dL (ref 13.0–17.7)
MCH: 32.4 pg (ref 26.6–33.0)
MCHC: 33.6 g/dL (ref 31.5–35.7)
MCV: 97 fL (ref 79–97)
Platelets: 223 10*3/uL (ref 150–450)
RBC: 4.44 x10E6/uL (ref 4.14–5.80)
RDW: 14.1 % (ref 12.3–15.4)
WBC: 6 10*3/uL (ref 3.4–10.8)

## 2018-02-16 LAB — TSH: TSH: 1.72 u[IU]/mL (ref 0.450–4.500)

## 2018-02-28 LAB — CUP PACEART REMOTE DEVICE CHECK
Battery Remaining Longevity: 79 mo
Battery Voltage: 2.79 V
Brady Statistic AS VP Percent: 95 %
Implantable Lead Implant Date: 20110517
Implantable Lead Location: 753860
Implantable Lead Model: 4470
Implantable Lead Serial Number: 543103
Implantable Pulse Generator Implant Date: 20110517
Lead Channel Pacing Threshold Amplitude: 0.375 V
Lead Channel Pacing Threshold Amplitude: 0.5 V
Lead Channel Pacing Threshold Pulse Width: 0.4 ms
Lead Channel Setting Pacing Amplitude: 2 V
Lead Channel Setting Pacing Pulse Width: 0.4 ms
Lead Channel Setting Sensing Sensitivity: 2 mV
MDC IDC LEAD IMPLANT DT: 20110517
MDC IDC LEAD LOCATION: 753859
MDC IDC LEAD SERIAL: 672341
MDC IDC MSMT BATTERY IMPEDANCE: 610 Ohm
MDC IDC MSMT LEADCHNL RA IMPEDANCE VALUE: 630 Ohm
MDC IDC MSMT LEADCHNL RA PACING THRESHOLD PULSEWIDTH: 0.4 ms
MDC IDC MSMT LEADCHNL RV IMPEDANCE VALUE: 650 Ohm
MDC IDC SESS DTM: 20190807124018
MDC IDC SET LEADCHNL RV PACING AMPLITUDE: 2.5 V
MDC IDC STAT BRADY AP VP PERCENT: 5 %
MDC IDC STAT BRADY AP VS PERCENT: 0 %
MDC IDC STAT BRADY AS VS PERCENT: 0 %

## 2018-03-13 DIAGNOSIS — H43813 Vitreous degeneration, bilateral: Secondary | ICD-10-CM | POA: Diagnosis not present

## 2018-03-13 DIAGNOSIS — H04123 Dry eye syndrome of bilateral lacrimal glands: Secondary | ICD-10-CM | POA: Diagnosis not present

## 2018-03-13 DIAGNOSIS — H353132 Nonexudative age-related macular degeneration, bilateral, intermediate dry stage: Secondary | ICD-10-CM | POA: Diagnosis not present

## 2018-03-13 DIAGNOSIS — H52203 Unspecified astigmatism, bilateral: Secondary | ICD-10-CM | POA: Diagnosis not present

## 2018-03-17 ENCOUNTER — Telehealth: Payer: Self-pay | Admitting: Internal Medicine

## 2018-03-17 NOTE — Telephone Encounter (Addendum)
Called patient's wife about her husband's symptoms. Patient complaining of headache, tingling in left hand, acting strange, and having slightly elevated HR yesterday. Patient's BP and HR are okay today. Informed her that it sounds like patient needs to follow up with his PCP.  Patient's wife stated that PCP is out of the office today. Patient's wife keeps pushing for an appointment today at our office. Informed patient if she feels like the patient needs to be seen to go to urgent care, but did offer an appointment with PA on Tuesday. Patient was concerned if Urgent Care could handle any heart/pacemaker issues. Informed patient's wife that I do not think this is related to his heart and possibly a neuro and circulation issue, but if urgent care had any questions or concerns they can contact our DOD. At this point patient's wife wanted DOD consulted now to see if our DOD could see patient today. Consulted Dr. Harrington Challenger. Dr. Harrington Challenger called patient and talked to patient's wife. Dr. Harrington Challenger advised patient to drink plenty of fluids, take Tylenol, and keep offered appointment on Tuesday. Called patient back and made appointment on Tuesday with Bhagat PA. Encouraged patient's wife to follow Dr. Harrington Challenger' advisement. She verbalized understanding and will call if patient feels better to cancel appointment for Tuesday.

## 2018-03-17 NOTE — Telephone Encounter (Signed)
Pt's wife calling, call sent to me per request to see Allred before scheduled appt in Feb. Once I got her on the phone she wanted appt today with Allred or Cecille Rubin  and neither are here. BP yesterday R arm 110/58 L arm 114/60 pulse 106, pulse did go down to 86 about 1045pm. 9am today R arm 100/54 L arm 114/60 pulse 84 Headache and tingling L hand. Denies numbness, chest pain or pressure-pls advise [phone]

## 2018-03-19 ENCOUNTER — Other Ambulatory Visit: Payer: Self-pay | Admitting: Internal Medicine

## 2018-03-21 ENCOUNTER — Ambulatory Visit: Payer: Medicare Other | Admitting: Physician Assistant

## 2018-03-24 ENCOUNTER — Ambulatory Visit (INDEPENDENT_AMBULATORY_CARE_PROVIDER_SITE_OTHER): Payer: Medicare Other | Admitting: Internal Medicine

## 2018-03-24 ENCOUNTER — Encounter: Payer: Self-pay | Admitting: Internal Medicine

## 2018-03-24 ENCOUNTER — Other Ambulatory Visit: Payer: Self-pay | Admitting: Internal Medicine

## 2018-03-24 VITALS — BP 130/70 | HR 66 | Ht 67.0 in | Wt 173.0 lb

## 2018-03-24 DIAGNOSIS — E7849 Other hyperlipidemia: Secondary | ICD-10-CM

## 2018-03-24 DIAGNOSIS — I2510 Atherosclerotic heart disease of native coronary artery without angina pectoris: Secondary | ICD-10-CM

## 2018-03-24 DIAGNOSIS — G4730 Sleep apnea, unspecified: Secondary | ICD-10-CM

## 2018-03-24 DIAGNOSIS — Z23 Encounter for immunization: Secondary | ICD-10-CM

## 2018-03-24 DIAGNOSIS — R7302 Impaired glucose tolerance (oral): Secondary | ICD-10-CM

## 2018-03-24 DIAGNOSIS — R413 Other amnesia: Secondary | ICD-10-CM | POA: Diagnosis not present

## 2018-03-24 DIAGNOSIS — Z950 Presence of cardiac pacemaker: Secondary | ICD-10-CM

## 2018-03-24 DIAGNOSIS — I10 Essential (primary) hypertension: Secondary | ICD-10-CM | POA: Diagnosis not present

## 2018-03-24 DIAGNOSIS — Z0000 Encounter for general adult medical examination without abnormal findings: Secondary | ICD-10-CM | POA: Diagnosis not present

## 2018-03-24 LAB — POCT URINALYSIS DIPSTICK
Appearance: NORMAL
BILIRUBIN UA: NEGATIVE
GLUCOSE UA: NEGATIVE
Ketones, UA: NEGATIVE
Leukocytes, UA: NEGATIVE
Nitrite, UA: NEGATIVE
Odor: NORMAL
Protein, UA: NEGATIVE
RBC UA: NEGATIVE
SPEC GRAV UA: 1.01 (ref 1.010–1.025)
Urobilinogen, UA: 0.2 E.U./dL
pH, UA: 7.5 (ref 5.0–8.0)

## 2018-03-24 MED ORDER — SERTRALINE HCL 25 MG PO TABS: 25.0000 mg | ORAL_TABLET | Freq: Every day | ORAL | 1 refills | Status: DC

## 2018-03-24 NOTE — Progress Notes (Signed)
Subjective:    Patient ID: Robert Ramsey, male    DOB: 12/09/1940, 77 y.o.   MRN: [id number]  HPI 77 year old Male with history of mild coronary artery disease, hypertension and hyperlipidemia.  History of pacemaker in place for Mobitz II  AV block.  Only tolerates low-dose statin due to memory issues.  Had catheterization 2007  showing mild diffuse coronary artery disease.  History of right partial nephrectomy for renal mass by Dr. Karsten Ro in 2012.  PSA checked recently  by Dr. Karsten Ro in addition to prostate exam.  PSA was normal.  Recently went with wife to her doctor's appt and became chilled. Could not get warm until he stood out in the sun.  Did not feel well the rest of the day and wife thought his memory was worse during that time.  I am not sure what happened to him that day.  He could have had some sort of viral syndrome.  Doubt that he had a stroke because the issue was temporary.  He has a number of drug intolerances.  See list of allergies.  Many of these are intolerances rather than true allergies.  He has a history of impaired glucose tolerance and GE reflux.  History of appendectomy, had cataract surgery December 2010, history of left bundle branch block.  Quit smoking in 1985.  Social history: He is married to a retired Marine scientist.  Continues to drive with supervision.  Non-smoker quit in 1995.  Does not consume alcohol.  Family history: Father deceased due to heart failure and mother deceased due to leukemia.  History of sleep apnea.  He had neuropsychological testing by Dr. Valentina Shaggy in 2016 and was diagnosed with mild cognitive impairment amnestic type        Review of Systems  Constitutional: Negative.  Negative for activity change, appetite change and unexpected weight change.  Respiratory: Negative.   Cardiovascular: Negative.   Gastrointestinal:       On PPI for reflux  Genitourinary: Negative.   Neurological:       Memory impairment  Psychiatric/Behavioral:  Negative for agitation and dysphoric mood.       Objective:   Physical Exam  Constitutional: He is oriented to person, place, and time. He appears well-developed and well-nourished. No distress.  HENT:  Head: Normocephalic and atraumatic.  Right Ear: External ear normal.  Left Ear: External ear normal.  Mouth/Throat: Oropharynx is clear and moist. No oropharyngeal exudate.  Eyes: Pupils are equal, round, and reactive to light. Conjunctivae and EOM are normal. No scleral icterus.  Neck: Neck supple. No JVD present. No thyromegaly present.  Cardiovascular: Normal rate, regular rhythm, normal heart sounds and intact distal pulses.  No murmur heard. Pulmonary/Chest: Effort normal. No respiratory distress. He has no wheezes. He has no rales.  Abdominal: Soft. Bowel sounds are normal. He exhibits no distension and no mass. There is no tenderness. There is no guarding.  Genitourinary:  Genitourinary Comments: Deferred to Dr. Karsten Ro  Musculoskeletal: He exhibits no edema.  Lymphadenopathy:    He has no cervical adenopathy.  Neurological: He is alert and oriented to person, place, and time. He displays normal reflexes. No cranial nerve deficit or sensory deficit. He exhibits normal muscle tone. Coordination normal.  Skin: Skin is warm and dry. He is not diaphoretic.  Psychiatric: He has a normal mood and affect. His behavior is normal. Judgment and thought content normal.  Vitals reviewed.         Assessment & Plan:  Memory  impairment-currently on Zoloft.  Followed by neurologist.  Symptoms started around 2011.  Seems to be having more issues with short-term memory and navigation when driving.  Recent episode of confusion after doctor's appointment with complaint of chills  History of Mobitz 2 AV block with pacemaker insertion  GE reflux  Hypertension stable on current regimen of low-dose Cozaar  History of mild diffuse coronary artery disease  Sleep apnea- on  CPAP  Hyperlipidemia with 2 drug regimen consisting of Zetia and Crestor  History of frequent urination treated with Myrbetriq  History of partial nephrectomy followed by urology  Plan: Continue same course of treatment return in 1 year or as needed.  Subjective:   Patient presents for Medicare Annual/Subsequent preventive examination.  Review Past Medical/Family/Social: See above   Risk Factors  Current exercise habits: Light exercise Dietary issues discussed: Low-fat low carbohydrate  Cardiac risk factors: Hyperlipidemia  Depression Screen  (Note: if answer to either of the following is "Yes", a more complete depression screening is indicated)   Over the past two weeks, have you felt down, depressed or hopeless? No  Over the past two weeks, have you felt little interest or pleasure in doing things? No Have you lost interest or pleasure in daily life? No Do you often feel hopeless? No Do you cry easily over simple problems? No   Activities of Daily Living  In your present state of health, do you have any difficulty performing the following activities?:   Driving? No  Managing money? No  Feeding yourself? No  Getting from bed to chair? No  Climbing a flight of stairs? No  Preparing food and eating?: No  Bathing or showering? No  Getting dressed: No  Getting to the toilet? No  Using the toilet:No  Moving around from place to place: No  In the past year have you fallen or had a near fall?:No  Are you sexually active? No  Do you have more than one partner? No   Hearing Difficulties: No  Do you often ask people to speak up or repeat themselves? No  Do you experience ringing or noises in your ears? No  Do you have difficulty understanding soft or whispered voices?  Yes Do you feel that you have a problem with memory?  States he has been diagnosed with mild cognitive impairment Do you often misplace items? No    Home Safety:  Do you have a smoke alarm at your  residence? Yes Do you have grab bars in the bathroom?  Yes Do you have throw rugs in your house?  Yes   Cognitive Testing  Alert? Yes Normal Appearance?Yes  Oriented to person? Yes Place? Yes  Time? Yes  Recall of three objects?  Not tested Can perform simple calculations?  Not tested Displays appropriate judgment?Yes just when seen here briefly in the office Can read the correct time from a watch face?Yes   List the Names of Other Physician/Practitioners you currently use:  See referral list for the physicians patient is currently seeing.  Cardiologist  Neurologist   Review of Systems: See above   Objective:     General appearance: Appears stated age  Head: Normocephalic, without obvious abnormality, atraumatic  Eyes: conj clear, EOMi PEERLA  Ears: normal TM's and external ear canals both ears  Nose: Nares normal. Septum midline. Mucosa normal. No drainage or sinus tenderness.  Throat: lips, mucosa, and tongue normal; teeth and gums normal  Neck: no adenopathy, no carotid bruit, no JVD, supple, symmetrical,  trachea midline and thyroid not enlarged, symmetric, no tenderness/mass/nodules  No CVA tenderness.  Lungs: clear to auscultation bilaterally  Breasts: normal appearance Heart: regular rate and rhythm, S1, S2 normal, no murmur, click, rub or gallop  Abdomen: soft, non-tender; bowel sounds normal; no masses, no organomegaly  Musculoskeletal: ROM normal in all joints, no crepitus, no deformity, Normal muscle strengthen. Back  is symmetric, no curvature. Skin: Skin color, texture, turgor normal. No rashes or lesions  Lymph nodes: Cervical, supraclavicular, and axillary nodes normal.  Neurologic: CN 2 -12 Normal, Normal symmetric reflexes. Normal coordination and gait  Psych: Alert & Oriented x 3, Mood appear stable.    Assessment:    Annual wellness medicare exam   Plan:    During the course of the visit the patient was educated and counseled about appropriate  screening and preventive services including:        Patient Instructions (the written plan) was given to the patient.  Medicare Attestation  I have personally reviewed:  The patient's medical and social history  Their use of alcohol, tobacco or illicit drugs  Their current medications and supplements  The patient's functional ability including ADLs,fall risks, home safety risks, cognitive, and hearing and visual impairment  Diet and physical activities  Evidence for depression or mood disorders  The patient's weight, height, BMI, and visual acuity have been recorded in the chart. I have made referrals, counseling, and provided education to the patient based on review of the above and I have provided the patient with a written personalized care plan for preventive services.

## 2018-04-02 NOTE — Patient Instructions (Signed)
It was a pleasure to see you today.  Continue follow-up with cardiology and neurology.  No change in medications.  Return in 1 year or as needed.

## 2018-04-18 ENCOUNTER — Encounter: Payer: Self-pay | Admitting: Internal Medicine

## 2018-04-18 ENCOUNTER — Ambulatory Visit (INDEPENDENT_AMBULATORY_CARE_PROVIDER_SITE_OTHER): Payer: Medicare Other | Admitting: Internal Medicine

## 2018-04-18 VITALS — BP 124/70 | HR 60 | Temp 98.3°F | Ht 67.0 in | Wt 173.0 lb

## 2018-04-18 DIAGNOSIS — R413 Other amnesia: Secondary | ICD-10-CM | POA: Diagnosis not present

## 2018-04-18 DIAGNOSIS — I2510 Atherosclerotic heart disease of native coronary artery without angina pectoris: Secondary | ICD-10-CM | POA: Diagnosis not present

## 2018-04-18 DIAGNOSIS — E7849 Other hyperlipidemia: Secondary | ICD-10-CM

## 2018-04-18 DIAGNOSIS — I10 Essential (primary) hypertension: Secondary | ICD-10-CM

## 2018-04-18 DIAGNOSIS — K5900 Constipation, unspecified: Secondary | ICD-10-CM | POA: Diagnosis not present

## 2018-04-18 MED ORDER — SERTRALINE HCL 25 MG PO TABS: ORAL_TABLET | ORAL | 2 refills | Status: DC

## 2018-04-18 NOTE — Patient Instructions (Signed)
Continue Zoloft 25 mg daily and take Miralax daily. CPE due late Sept 2020.

## 2018-04-18 NOTE — Progress Notes (Signed)
   Subjective:    Patient ID: Robert Ramsey, male    DOB: [birth year], 77 y.o.   MRN: [id number]  HPI 77 year old Male for follow up. Seen for CPE Sept 20th. Had experienced episode with memory loss after becoming chilled. Wife says she has tried to be more tolerant of memory issues. He is on low dose Zoloft 25 mg daily. BP stable on current regimen. Has been more constipated on Zoloft. Advise take Miralax daily.he has questions about that including how much and what time of day. Likes to be exact with instructions.  BP stable on current regimen at 124/70.Weight is the same    Review of Systems see above     Objective:   Physical Exam Neck supple. Chest clear to auscultation. Cor:RRR alert.       Assessment & Plan:  Memory loss with frustration at times- continue Zoloft.  HTN-stable--on current regimen  Hyperlipidemia on 2 drug regimen  Constipation may be aggravated by Zoloft. Take Miralax daily.  Plan: Continue Zoloft and RTC Sept 2020  15 minutes spent with pt and wife

## 2018-04-19 ENCOUNTER — Encounter: Payer: Self-pay | Admitting: Internal Medicine

## 2018-05-01 DIAGNOSIS — Z86010 Personal history of colonic polyps: Secondary | ICD-10-CM | POA: Diagnosis not present

## 2018-05-01 DIAGNOSIS — K5901 Slow transit constipation: Secondary | ICD-10-CM | POA: Diagnosis not present

## 2018-05-01 DIAGNOSIS — K210 Gastro-esophageal reflux disease with esophagitis: Secondary | ICD-10-CM | POA: Diagnosis not present

## 2018-05-10 ENCOUNTER — Ambulatory Visit (INDEPENDENT_AMBULATORY_CARE_PROVIDER_SITE_OTHER): Payer: Medicare Other | Admitting: *Deleted

## 2018-05-10 DIAGNOSIS — I442 Atrioventricular block, complete: Secondary | ICD-10-CM

## 2018-05-10 NOTE — Progress Notes (Signed)
Remote pacemaker transmission.   

## 2018-05-14 ENCOUNTER — Encounter: Payer: Self-pay | Admitting: Cardiology

## 2018-06-15 ENCOUNTER — Telehealth: Payer: Self-pay | Admitting: *Deleted

## 2018-06-15 NOTE — Telephone Encounter (Signed)
   [facility] Pre-operative Risk Assessment    Request for surgical clearance:  1. What type of surgery is being performed? DEEP CLEANING    2. When is this surgery scheduled?  TBD   3. What type of clearance is required (medical clearance vs. Pharmacy clearance to hold med vs. Both)?  DENTIST WANTS TO KNOW IF PT NEEDS PREMED  4. Are there any medications that need to be held prior to surgery and how long? N/A   5. Practice name and name of physician performing surgery?  N/A   6. What is your office phone number [phone]    7.   What is your office fax number [phone]  8.   Anesthesia type (None, local, MAC, general) ?  LOCAL NUMBING   Jeanann Lewandowsky 06/15/2018, 11:09 AM  _________________________________________________________________   (provider comments below)

## 2018-06-16 NOTE — Telephone Encounter (Signed)
Pt has not had any valvular heart surgery.  No need for antibiotics before procedure.

## 2018-06-19 ENCOUNTER — Other Ambulatory Visit: Payer: Medicare Other | Admitting: Internal Medicine

## 2018-06-19 ENCOUNTER — Other Ambulatory Visit: Payer: Self-pay | Admitting: Internal Medicine

## 2018-06-19 DIAGNOSIS — R7302 Impaired glucose tolerance (oral): Secondary | ICD-10-CM

## 2018-06-20 ENCOUNTER — Encounter: Payer: Self-pay | Admitting: Internal Medicine

## 2018-06-23 LAB — HEMOGLOBIN A1C
EAG (MMOL/L): 6 (calc)
HEMOGLOBIN A1C: 5.4 %{Hb} (ref <5.7)
Mean Plasma Glucose: 108 (calc)

## 2018-07-09 LAB — CUP PACEART REMOTE DEVICE CHECK
Battery Impedance: 659 Ohm
Brady Statistic AP VP Percent: 6 %
Brady Statistic AP VS Percent: 0 %
Brady Statistic AS VP Percent: 94 %
Date Time Interrogation Session: 20191106133253
Implantable Lead Implant Date: 20110517
Implantable Lead Location: 753859
Implantable Lead Model: 4469
Implantable Lead Model: 4470
Implantable Lead Serial Number: 543103
Implantable Lead Serial Number: 672341
Implantable Pulse Generator Implant Date: 20110517
Lead Channel Impedance Value: 651 Ohm
Lead Channel Impedance Value: 666 Ohm
Lead Channel Pacing Threshold Amplitude: 0.375 V
Lead Channel Pacing Threshold Amplitude: 0.5 V
Lead Channel Pacing Threshold Pulse Width: 0.4 ms
Lead Channel Setting Pacing Amplitude: 2.5 V
Lead Channel Setting Sensing Sensitivity: 2 mV
MDC IDC LEAD IMPLANT DT: 20110517
MDC IDC LEAD LOCATION: 753860
MDC IDC MSMT BATTERY REMAINING LONGEVITY: 76 mo
MDC IDC MSMT BATTERY VOLTAGE: 2.79 V
MDC IDC MSMT LEADCHNL RV PACING THRESHOLD PULSEWIDTH: 0.4 ms
MDC IDC SET LEADCHNL RA PACING AMPLITUDE: 2 V
MDC IDC SET LEADCHNL RV PACING PULSEWIDTH: 0.4 ms
MDC IDC STAT BRADY AS VS PERCENT: 0 %

## 2018-07-10 DIAGNOSIS — Z86010 Personal history of colonic polyps: Secondary | ICD-10-CM | POA: Diagnosis not present

## 2018-07-10 DIAGNOSIS — D123 Benign neoplasm of transverse colon: Secondary | ICD-10-CM | POA: Diagnosis not present

## 2018-07-10 DIAGNOSIS — K5730 Diverticulosis of large intestine without perforation or abscess without bleeding: Secondary | ICD-10-CM | POA: Diagnosis not present

## 2018-07-10 LAB — HM COLONOSCOPY

## 2018-07-12 DIAGNOSIS — D123 Benign neoplasm of transverse colon: Secondary | ICD-10-CM | POA: Diagnosis not present

## 2018-07-17 ENCOUNTER — Other Ambulatory Visit: Payer: Self-pay | Admitting: Internal Medicine

## 2018-07-17 MED ORDER — ROSUVASTATIN CALCIUM 5 MG PO TABS: ORAL_TABLET | ORAL | 2 refills | Status: DC

## 2018-07-26 ENCOUNTER — Encounter: Payer: Self-pay | Admitting: Internal Medicine

## 2018-08-09 ENCOUNTER — Ambulatory Visit (INDEPENDENT_AMBULATORY_CARE_PROVIDER_SITE_OTHER): Payer: Medicare Other

## 2018-08-09 DIAGNOSIS — I441 Atrioventricular block, second degree: Secondary | ICD-10-CM | POA: Diagnosis not present

## 2018-08-09 DIAGNOSIS — I10 Essential (primary) hypertension: Secondary | ICD-10-CM

## 2018-08-11 LAB — CUP PACEART REMOTE DEVICE CHECK
Battery Impedance: 733 Ohm
Brady Statistic AP VP Percent: 7 %
Brady Statistic AP VS Percent: 0 %
Brady Statistic AS VP Percent: 92 %
Brady Statistic AS VS Percent: 0 %
Implantable Lead Implant Date: 20110517
Implantable Lead Location: 753859
Implantable Lead Model: 4469
Implantable Lead Model: 4470
Implantable Lead Serial Number: 543103
Implantable Lead Serial Number: 672341
Implantable Pulse Generator Implant Date: 20110517
Lead Channel Impedance Value: 653 Ohm
Lead Channel Impedance Value: 692 Ohm
Lead Channel Pacing Threshold Amplitude: 0.375 V
Lead Channel Pacing Threshold Amplitude: 0.5 V
Lead Channel Pacing Threshold Pulse Width: 0.4 ms
Lead Channel Setting Pacing Amplitude: 2.5 V
MDC IDC LEAD IMPLANT DT: 20110517
MDC IDC LEAD LOCATION: 753860
MDC IDC MSMT BATTERY REMAINING LONGEVITY: 73 mo
MDC IDC MSMT BATTERY VOLTAGE: 2.78 V
MDC IDC MSMT LEADCHNL RV PACING THRESHOLD PULSEWIDTH: 0.4 ms
MDC IDC SESS DTM: 20200205153603
MDC IDC SET LEADCHNL RA PACING AMPLITUDE: 2 V
MDC IDC SET LEADCHNL RV PACING PULSEWIDTH: 0.4 ms
MDC IDC SET LEADCHNL RV SENSING SENSITIVITY: 2 mV

## 2018-08-18 NOTE — Progress Notes (Signed)
Remote pacemaker transmission.   

## 2018-08-21 ENCOUNTER — Encounter: Payer: Medicare Other | Admitting: Internal Medicine

## 2018-08-23 ENCOUNTER — Encounter: Payer: Medicare Other | Admitting: Internal Medicine

## 2018-08-28 ENCOUNTER — Encounter: Payer: Self-pay | Admitting: Internal Medicine

## 2018-08-28 ENCOUNTER — Ambulatory Visit (INDEPENDENT_AMBULATORY_CARE_PROVIDER_SITE_OTHER): Payer: Medicare Other | Admitting: Internal Medicine

## 2018-08-28 VITALS — BP 126/72 | HR 78 | Ht 68.0 in | Wt 170.2 lb

## 2018-08-28 DIAGNOSIS — Z950 Presence of cardiac pacemaker: Secondary | ICD-10-CM

## 2018-08-28 DIAGNOSIS — I2510 Atherosclerotic heart disease of native coronary artery without angina pectoris: Secondary | ICD-10-CM | POA: Diagnosis not present

## 2018-08-28 DIAGNOSIS — I10 Essential (primary) hypertension: Secondary | ICD-10-CM | POA: Diagnosis not present

## 2018-08-28 DIAGNOSIS — E7849 Other hyperlipidemia: Secondary | ICD-10-CM

## 2018-08-28 DIAGNOSIS — I442 Atrioventricular block, complete: Secondary | ICD-10-CM

## 2018-08-28 NOTE — Progress Notes (Signed)
PCP: Elby Showers, MD   Primary EP:  Dr Rayann Heman  Robert Ramsey is a 78 y.o. male who presents today for routine electrophysiology followup.  Since last being seen in our clinic, the patient reports doing very well.  Today, he denies symptoms of palpitations, chest pain, shortness of breath,  lower extremity edema, dizziness, presyncope, or syncope.  The patient is otherwise without complaint today.   Past Medical History:  Diagnosis Date  . Abdominal pain   . Abnormal stress test 09/15/10   SEPTAL DEFECT SECONDARY TO PREVIOUS INFARCT VS LBBB; MILD SEPTAL ISCHEMIA  . Acid reflux   . CAD (coronary artery disease)    cath in 2007 reveals mild diffuse CAD  . Colon polyps   . Complete heart block [facility]) May 2011   has PTVP in place  . Diverticulosis   . Glucose intolerance (impaired glucose tolerance)   . Hemorrhoids   . Hiatal hernia   . History of colonoscopy   . HTN (hypertension)   . Hypercholesterolemia   . Memory impairment    better with lower dose statin  . Pacemaker   . PVC's (premature ventricular contractions)   . Sinusitis   . Sleep apnea   . Ventral hernia    Past Surgical History:  Procedure Laterality Date  . APPENDECTOMY    . CARDIAC CATHETERIZATION  07/29/2005   EF 50-55%; Has mild diffuse CAD  . cataract surgery  06/2009, 10/2014  . COLONOSCOPY    . CPAP    . LBBB    . NOSE SURGERY    . PACEMAKER INSERTION  11/18/09   MDT implanted by Dr Doreatha Lew  . REFRACTIVE SURGERY Bilateral   . right nephrectomy  01/2006   partial for mass    ROS- all systems are reviewed and negative except as per HPI above  Current Outpatient Medications  Medication Sig Dispense Refill  . aspirin 81 MG tablet Take 81 mg by mouth daily.    Marland Kitchen ezetimibe (ZETIA) 10 MG tablet TAKE 1 TABLET BY MOUTH  DAILY 90 tablet 3  . famotidine (PEPCID) 20 MG tablet Take 2 tablets by mouth daily.    Marland Kitchen losartan (COZAAR) 25 MG tablet TAKE 1 TABLET BY MOUTH  DAILY 90 tablet 3  . Multiple  Vitamin (MULTIVITAMIN) tablet Take 1 tablet by mouth daily.      Marland Kitchen MYRBETRIQ 50 MG TB24 tablet Take 50 mg by mouth daily.    . rosuvastatin (CRESTOR) 5 MG tablet Take 1/2 tablet by mouth three times a week. 45 tablet 2  . sertraline (ZOLOFT) 25 MG tablet TAKE 1 TABLET(25 MG) BY MOUTH DAILY 90 tablet 2   No current facility-administered medications for this visit.     Physical Exam: Vitals:   08/28/18 1447  BP: 126/72  Pulse: 78  SpO2: 98%  Weight: 170 lb 3.2 oz (77.2 kg)  Height: 5\' 8"  (1.727 m)    GEN- The patient is well appearing, alert and oriented x 3 today.   Head- normocephalic, atraumatic Eyes-  Sclera clear, conjunctiva pink Ears- hearing intact Oropharynx- clear Lungs- Clear to ausculation bilaterally, normal work of breathing Chest- pacemaker pocket is well healed Heart- Regular rate and rhythm, no murmurs, rubs or gallops, PMI not laterally displaced GI- soft, NT, ND, + BS Extremities- no clubbing, cyanosis, or edema  Pacemaker interrogation- reviewed in detail today,  See PACEART report  ekg tracing ordered today is personally reviewed and shows sinus with V pacing  Assessment  and Plan:  1. Symptomatic complete heart block Normal pacemaker function See Pace Art report No changes today  2. HTN Stable No change required today  3. CAD No ischemic symptoms Medical management (Mild diffuse disease per remote cath)  4. HL Stable No change required today  5. OSA Compliant with CPAP  carelink Return in a year to see Cecille Rubin in 6 months I will see in a year  [name], [facility] 08/28/2018 2:52 PM

## 2018-08-28 NOTE — Patient Instructions (Addendum)
Medication Instructions:  Your physician recommends that you continue on your current medications as directed. Please refer to the Current Medication list given to you today.  Labwork: None ordered.  Testing/Procedures: None ordered.  Follow-Up: Your physician wants you to follow-up in: 6 months with Truitt Merle.  You will receive a reminder letter in the mail two months in advance. If you don't receive a letter, please call our office to schedule the follow-up appointment.  Your physician wants you to follow-up in: one year with Dr. Rayann Heman.   You will receive a reminder letter in the mail two months in advance. If you don't receive a letter, please call our office to schedule the follow-up appointment.  Remote monitoring is used to monitor your Pacemaker from home. This monitoring reduces the number of office visits required to check your device to one time per year. It allows [place] to keep an eye on the functioning of your device to ensure it is working properly. You are scheduled for a device check from home on 11/15/2018. You may send your transmission at any time that day. If you have a wireless device, the transmission will be sent automatically. After your physician reviews your transmission, you will receive a postcard with your next transmission date.  Any Other Special Instructions Will Be Listed Below (If Applicable).  If you need a refill on your cardiac medications before your next appointment, please call your pharmacy.

## 2018-09-13 LAB — CUP PACEART INCLINIC DEVICE CHECK
Battery Impedance: 708 Ohm
Battery Remaining Longevity: 73 mo
Battery Voltage: 2.78 V
Brady Statistic AP VP Percent: 7 %
Brady Statistic AP VS Percent: 0 %
Brady Statistic AS VP Percent: 92 %
Brady Statistic AS VS Percent: 0 %
Date Time Interrogation Session: 20200224202948
Implantable Lead Implant Date: 20110517
Implantable Lead Implant Date: 20110517
Implantable Lead Location: 753859
Implantable Lead Location: 753860
Implantable Lead Model: 4469
Implantable Lead Model: 4470
Implantable Lead Serial Number: 543103
Implantable Lead Serial Number: 672341
Implantable Pulse Generator Implant Date: 20110517
Lead Channel Impedance Value: 640 Ohm
Lead Channel Impedance Value: 648 Ohm
Lead Channel Pacing Threshold Amplitude: 0.5 V
Lead Channel Pacing Threshold Amplitude: 0.5 V
Lead Channel Pacing Threshold Pulse Width: 0.4 ms
Lead Channel Pacing Threshold Pulse Width: 0.4 ms
Lead Channel Sensing Intrinsic Amplitude: 2.8 mV
Lead Channel Setting Pacing Amplitude: 2 V
Lead Channel Setting Pacing Amplitude: 2.5 V
Lead Channel Setting Pacing Pulse Width: 0.4 ms
Lead Channel Setting Sensing Sensitivity: 2 mV

## 2018-11-15 ENCOUNTER — Other Ambulatory Visit: Payer: Self-pay

## 2018-11-15 ENCOUNTER — Ambulatory Visit (INDEPENDENT_AMBULATORY_CARE_PROVIDER_SITE_OTHER): Payer: Medicare Other | Admitting: *Deleted

## 2018-11-15 DIAGNOSIS — I441 Atrioventricular block, second degree: Secondary | ICD-10-CM

## 2018-11-15 DIAGNOSIS — I442 Atrioventricular block, complete: Secondary | ICD-10-CM | POA: Diagnosis not present

## 2018-11-15 LAB — CUP PACEART REMOTE DEVICE CHECK
Battery Impedance: 808 Ohm
Battery Remaining Longevity: 69 mo
Battery Voltage: 2.78 V
Brady Statistic AP VP Percent: 7 %
Brady Statistic AP VS Percent: 0 %
Brady Statistic AS VP Percent: 93 %
Brady Statistic AS VS Percent: 1 %
Date Time Interrogation Session: 20200513132418
Implantable Lead Implant Date: 20110517
Implantable Lead Implant Date: 20110517
Implantable Lead Location: 753859
Implantable Lead Location: 753860
Implantable Lead Model: 4469
Implantable Lead Model: 4470
Implantable Lead Serial Number: 543103
Implantable Lead Serial Number: 672341
Implantable Pulse Generator Implant Date: 20110517
Lead Channel Impedance Value: 640 Ohm
Lead Channel Impedance Value: 669 Ohm
Lead Channel Pacing Threshold Amplitude: 0.375 V
Lead Channel Pacing Threshold Amplitude: 0.5 V
Lead Channel Pacing Threshold Pulse Width: 0.4 ms
Lead Channel Pacing Threshold Pulse Width: 0.4 ms
Lead Channel Setting Pacing Amplitude: 2 V
Lead Channel Setting Pacing Amplitude: 2.5 V
Lead Channel Setting Pacing Pulse Width: 0.4 ms
Lead Channel Setting Sensing Sensitivity: 2 mV

## 2018-12-01 NOTE — Progress Notes (Signed)
Remote pacemaker transmission.   

## 2018-12-08 ENCOUNTER — Telehealth: Payer: Self-pay | Admitting: Internal Medicine

## 2018-12-08 NOTE — Telephone Encounter (Signed)
Pt called and said he has been told by others and he can tell as well that he has been losing weight unintentionally, he said its been going on before a month, he believes its around 20 lbs, he wasn't sure if you want to see him before his scheduled appt or have him tested before

## 2018-12-08 NOTE — Telephone Encounter (Signed)
His wife passed away recently. Please have him come for labs next week CBC with diff c-met, tsh and keep appt for June 19

## 2018-12-11 ENCOUNTER — Other Ambulatory Visit: Payer: Self-pay

## 2018-12-11 ENCOUNTER — Other Ambulatory Visit: Payer: Medicare Other | Admitting: Internal Medicine

## 2018-12-11 DIAGNOSIS — R634 Abnormal weight loss: Secondary | ICD-10-CM | POA: Diagnosis not present

## 2018-12-11 LAB — CBC WITH DIFFERENTIAL/PLATELET
Absolute Monocytes: 470 cells/uL (ref 200–950)
Basophils Absolute: 38 cells/uL (ref 0–200)
Basophils Relative: 0.8 %
Eosinophils Absolute: 110 cells/uL (ref 15–500)
Eosinophils Relative: 2.3 %
HCT: 39 % (ref 38.5–50.0)
Hemoglobin: 13.1 g/dL — ABNORMAL LOW (ref 13.2–17.1)
Lymphs Abs: 1056 cells/uL (ref 850–3900)
MCH: 32 pg (ref 27.0–33.0)
MCHC: 33.6 g/dL (ref 32.0–36.0)
MCV: 95.4 fL (ref 80.0–100.0)
MPV: 10.5 fL (ref 7.5–12.5)
Monocytes Relative: 9.8 %
Neutro Abs: 3125 cells/uL (ref 1500–7800)
Neutrophils Relative %: 65.1 %
Platelets: 215 10*3/uL (ref 140–400)
RBC: 4.09 10*6/uL — ABNORMAL LOW (ref 4.20–5.80)
RDW: 12.9 % (ref 11.0–15.0)
Total Lymphocyte: 22 %
WBC: 4.8 10*3/uL (ref 3.8–10.8)

## 2018-12-11 LAB — COMPLETE METABOLIC PANEL WITH GFR
AG Ratio: 2 (calc) (ref 1.0–2.5)
ALT: 17 U/L (ref 9–46)
AST: 20 U/L (ref 10–35)
Albumin: 4.1 g/dL (ref 3.6–5.1)
Alkaline phosphatase (APISO): 52 U/L (ref 35–144)
BUN: 21 mg/dL (ref 7–25)
CO2: 26 mmol/L (ref 20–32)
Calcium: 10 mg/dL (ref 8.6–10.3)
Chloride: 105 mmol/L (ref 98–110)
Creat: 0.92 mg/dL (ref 0.70–1.18)
GFR, Est African American: 91 mL/min/{1.73_m2} (ref >=60)
GFR, Est Non African American: 79 mL/min/{1.73_m2} (ref >=60)
Globulin: 2.1 g/dL (calc) (ref 1.9–3.7)
Glucose, Bld: 92 mg/dL (ref 65–139)
Potassium: 4.6 mmol/L (ref 3.5–5.3)
Sodium: 138 mmol/L (ref 135–146)
Total Bilirubin: 0.5 mg/dL (ref 0.2–1.2)
Total Protein: 6.2 g/dL (ref 6.1–8.1)

## 2018-12-11 LAB — TSH: TSH: 1.53 mIU/L (ref 0.40–4.50)

## 2018-12-13 ENCOUNTER — Telehealth: Payer: Self-pay | Admitting: *Deleted

## 2018-12-13 NOTE — Telephone Encounter (Signed)
Due to current COVID 19 pandemic, our office is severely reducing in office visits until further notice, in order to minimize the risk to our patients and healthcare providers. Unable to get in contact with the patient to convert their office visit with [facility] on 12/18/2018 into a doxy.me visit. I left a voicemail asking the patient to return my call. Office number was provided.     If patient calls back please convert their office visit into a doxy.me visit.

## 2018-12-18 ENCOUNTER — Ambulatory Visit: Payer: Medicare Other | Admitting: Adult Health

## 2018-12-22 ENCOUNTER — Other Ambulatory Visit: Payer: Self-pay

## 2018-12-22 ENCOUNTER — Encounter: Payer: Self-pay | Admitting: Internal Medicine

## 2018-12-22 ENCOUNTER — Ambulatory Visit (INDEPENDENT_AMBULATORY_CARE_PROVIDER_SITE_OTHER): Payer: Medicare Other | Admitting: Internal Medicine

## 2018-12-22 ENCOUNTER — Other Ambulatory Visit: Payer: Self-pay | Admitting: Internal Medicine

## 2018-12-22 VITALS — BP 136/68 | HR 70 | Temp 97.8°F | Resp 16 | Ht 68.0 in | Wt 160.0 lb

## 2018-12-22 DIAGNOSIS — I2510 Atherosclerotic heart disease of native coronary artery without angina pectoris: Secondary | ICD-10-CM

## 2018-12-22 DIAGNOSIS — R634 Abnormal weight loss: Secondary | ICD-10-CM | POA: Diagnosis not present

## 2018-12-22 DIAGNOSIS — R739 Hyperglycemia, unspecified: Secondary | ICD-10-CM

## 2018-12-22 LAB — POCT URINALYSIS DIP (CLINITEK)
Bilirubin, UA: NEGATIVE
Blood, UA: NEGATIVE
Glucose, UA: NEGATIVE mg/dL
Leukocytes, UA: NEGATIVE — AB
Nitrite, UA: NEGATIVE
POC PROTEIN,UA: NEGATIVE
Spec Grav, UA: 1.015 (ref 1.010–1.025)
Urobilinogen, UA: 0.2 E.U./dL
pH, UA: 6.5 (ref 5.0–8.0)

## 2018-12-22 NOTE — Progress Notes (Signed)
   Subjective:    Patient ID: Robert Ramsey, male    DOB: 11-01-1940, 78 y.o.   MRN: [id number]  HPI 78 year old Male in for weight loss.  He was seen in September for health maintenance exam.  At that time he weighed 173 pounds.  Now weighs 160 pounds.  He says his family has been concerned about him.  He lost his wife recently to cancer.  He is living alone.  He says he is eating well and goes out to eat several meals a day but he has a history of memory loss and I am concerned that this history may not be accurate.  I am going to place a call to his daughter to ask her to be alert to this possibility.  In the meantime, I would like to do some screening labs such as TSH and hemoglobin A1c.  He says he sleeping fairly well.  He is clearly grieving.  He has a history of pacemaker placement for Mobitz 2 AV block.  He has hypertension, mild coronary disease and hyperlipidemia.  He has a number of drug intolerances.  He has a history of impaired glucose tolerance and GE reflux.  He quit smoking in 1995.  Does not consume alcohol.  Father died due to heart failure and mother died due to leukemia.  He has a history of sleep apnea.  He had neuropsychological testing by Dr. Valentina Shaggy in 2016 and was diagnosed with mild cognitive impairment.  He has been evaluated by Dr. Tish Frederickson.    He takes Zoloft, Crestor, losartan, Zetia, Myrbetriq.    Review of Systems see above     Objective:   Physical Exam Vital signs reviewed.  Skin warm and dry.  Nodes none.  Neck is supple.  Chest clear.  Cardiac exam regular rate and rhythm.  Abdomen is benign.  Cognition not checked today due to lack of time       Assessment & Plan:  Weight loss of 13 pounds  Grief reaction secondary to loss of wife  History of pacemaker  Plan: Continue with Zoloft as previously prescribed.  Screening labs done to rule out significant issues with diabetes or hypothyroidism.  Recommended he drink 1 Ensure daily.   Contact his daughter regarding the situation and follow-up in a few weeks.  Addendum: His prealbumin is low at 19 normal being between 21 and 43.  Hopefully ensure will help.  Follow-up visit is July 13

## 2018-12-23 LAB — PREALBUMIN: Prealbumin: 19 mg/dL — ABNORMAL LOW (ref 21–43)

## 2018-12-23 LAB — HEMOGLOBIN A1C
Hgb A1c MFr Bld: 5.3 % of total Hgb (ref <5.7)
Mean Plasma Glucose: 105 (calc)
eAG (mmol/L): 5.8 (calc)

## 2018-12-30 DIAGNOSIS — R634 Abnormal weight loss: Secondary | ICD-10-CM | POA: Insufficient documentation

## 2018-12-30 NOTE — Patient Instructions (Signed)
Drink 1 Ensure daily.  Try to eat well.  Follow-up July 13.  Your protein stores are a bit low.

## 2019-01-15 ENCOUNTER — Encounter: Payer: Self-pay | Admitting: Internal Medicine

## 2019-01-15 ENCOUNTER — Other Ambulatory Visit: Payer: Self-pay

## 2019-01-15 ENCOUNTER — Ambulatory Visit (INDEPENDENT_AMBULATORY_CARE_PROVIDER_SITE_OTHER): Payer: Medicare Other | Admitting: Internal Medicine

## 2019-01-15 VITALS — BP 120/60 | HR 75 | Temp 97.8°F | Ht 68.0 in | Wt 165.0 lb

## 2019-01-15 DIAGNOSIS — R634 Abnormal weight loss: Secondary | ICD-10-CM | POA: Diagnosis not present

## 2019-01-15 DIAGNOSIS — I2510 Atherosclerotic heart disease of native coronary artery without angina pectoris: Secondary | ICD-10-CM

## 2019-01-15 NOTE — Patient Instructions (Addendum)
Pre-albumin checked today.  Continue current regimen.  Does not have to keep food diary in detail.  Daughter will continue to check in on him regularly.  Follow-up in late September for annual Medicare wellness visit, complete physical exam and fasting lab work.
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
## 2019-01-16 LAB — PREALBUMIN: Prealbumin: 20 mg/dL — ABNORMAL LOW (ref 21–43)

## 2019-01-17 DIAGNOSIS — H353132 Nonexudative age-related macular degeneration, bilateral, intermediate dry stage: Secondary | ICD-10-CM | POA: Diagnosis not present

## 2019-01-17 DIAGNOSIS — Z961 Presence of intraocular lens: Secondary | ICD-10-CM | POA: Diagnosis not present

## 2019-01-17 DIAGNOSIS — H43813 Vitreous degeneration, bilateral: Secondary | ICD-10-CM | POA: Diagnosis not present

## 2019-02-14 ENCOUNTER — Ambulatory Visit (INDEPENDENT_AMBULATORY_CARE_PROVIDER_SITE_OTHER): Payer: Medicare Other | Admitting: *Deleted

## 2019-02-14 DIAGNOSIS — I442 Atrioventricular block, complete: Secondary | ICD-10-CM

## 2019-02-14 LAB — CUP PACEART REMOTE DEVICE CHECK
Battery Impedance: 859 Ohm
Battery Remaining Longevity: 66 mo
Battery Voltage: 2.77 V
Brady Statistic AP VP Percent: 7 %
Brady Statistic AP VS Percent: 0 %
Brady Statistic AS VP Percent: 92 %
Brady Statistic AS VS Percent: 1 %
Date Time Interrogation Session: 20200812112717
Implantable Lead Implant Date: 20110517
Implantable Lead Implant Date: 20110517
Implantable Lead Location: 753859
Implantable Lead Location: 753860
Implantable Lead Model: 4469
Implantable Lead Model: 4470
Implantable Lead Serial Number: 543103
Implantable Lead Serial Number: 672341
Implantable Pulse Generator Implant Date: 20110517
Lead Channel Impedance Value: 636 Ohm
Lead Channel Impedance Value: 662 Ohm
Lead Channel Pacing Threshold Amplitude: 0.375 V
Lead Channel Pacing Threshold Amplitude: 0.375 V
Lead Channel Pacing Threshold Pulse Width: 0.4 ms
Lead Channel Pacing Threshold Pulse Width: 0.4 ms
Lead Channel Setting Pacing Amplitude: 2 V
Lead Channel Setting Pacing Amplitude: 2.5 V
Lead Channel Setting Pacing Pulse Width: 0.4 ms
Lead Channel Setting Sensing Sensitivity: 2 mV

## 2019-02-22 ENCOUNTER — Encounter: Payer: Self-pay | Admitting: Cardiology

## 2019-02-22 NOTE — Progress Notes (Signed)
Remote pacemaker transmission.   

## 2019-02-24 NOTE — Progress Notes (Signed)
CARDIOLOGY OFFICE NOTE  Date:  02/26/2019    Robert Ramsey Date of Birth: 02-21-1941 Medical Record [id number]  PCP:  Robert Showers, MD  Cardiologist:  [place]    Chief Complaint  Patient presents with  . Follow-up    History of Present Illness: Robert Ramsey is a 78 y.o. male who presents today for a 6 month check. Seen for Dr. Rayann Heman. He is a former patient of Dr. Susa Simmonds.   He has mild CAD, HTN, HLD and pacemaker in place for Mobitz II AV block. Tolerates only low dose statin due to memory issues. He has had remote cath from 2007 showing mild diffuse CAD. He has had prior right partial nephrectomy for a renal mass by Dr. Karsten Ro back in 2012. He has had some progressive memory issues -  No longer on beta blocker.   Last seen by Dr. Rayann Heman in February. I saw him last August.   The patient does not have symptoms concerning for COVID-19 infection (fever, chills, cough, or new shortness of breath).   Comes in today. Here alone. His wife has died back in 2022/12/02 from lung cancer - it has been hard but he seems to be doing ok. No chest pain. Breathing is good. He is keeping a food diary - lots of fast food noted - but weight has trended down. He has a reminded calendar that his girls have filled out for him as well to help keep him on track. BP is ok. Medicines are going well. He has no real concerns.   Past Medical History:  Diagnosis Date  . Abdominal pain   . Abnormal stress test 09/15/10   SEPTAL DEFECT SECONDARY TO PREVIOUS INFARCT VS LBBB; MILD SEPTAL ISCHEMIA  . Acid reflux   . CAD (coronary artery disease)    cath in 2007 reveals mild diffuse CAD  . Colon polyps   . Complete heart block [facility]) 12-01-2009   has PTVP in place  . Diverticulosis   . Glucose intolerance (impaired glucose tolerance)   . Hemorrhoids   . Hiatal hernia   . History of colonoscopy   . HTN (hypertension)   . Hypercholesterolemia   . Memory impairment    better with lower  dose statin  . Pacemaker   . PVC's (premature ventricular contractions)   . Sinusitis   . Sleep apnea   . Ventral hernia     Past Surgical History:  Procedure Laterality Date  . APPENDECTOMY    . CARDIAC CATHETERIZATION  07/29/2005   EF 50-55%; Has mild diffuse CAD  . cataract surgery  06/2009, 10/2014  . COLONOSCOPY    . CPAP    . LBBB    . NOSE SURGERY    . PACEMAKER INSERTION  11/18/09   MDT implanted by Dr Doreatha Lew  . REFRACTIVE SURGERY Bilateral   . right nephrectomy  01/2006   partial for mass     Medications: Current Meds  Medication Sig  . aspirin 81 MG tablet Take 81 mg by mouth daily.  Marland Kitchen ezetimibe (ZETIA) 10 MG tablet TAKE 1 TABLET BY MOUTH  DAILY  . famotidine (PEPCID) 20 MG tablet Take 20 mg by mouth 2 (two) times daily.  Marland Kitchen losartan (COZAAR) 25 MG tablet TAKE 1 TABLET BY MOUTH  DAILY  . Multiple Vitamin (MULTIVITAMIN) tablet Take 1 tablet by mouth daily.    Marland Kitchen MYRBETRIQ 50 MG TB24 tablet Take 50 mg by mouth daily.  . rosuvastatin (  CRESTOR) 5 MG tablet Take 1/2 tablet by mouth three times a week.  . sertraline (ZOLOFT) 25 MG tablet TAKE 1 TABLET(25 MG) BY MOUTH DAILY  . UNABLE TO FIND Med Name: [employer] preservision eye vitamin and mineral supplement     Allergies: Allergies  Allergen Reactions  . Acyclovir And Related   . Cinnamon     Cinnamon toothpaste caused him to break out inside his mouth and lesions were removed  . Flexeril [Cyclobenzaprine Hcl]     Possible heart side effects  . Imodium [Loperamide Hcl]     unknown  . Levofloxacin Other (See Comments)    Elevated heart rate  . Nabumetone     REACTION: Heart issues/hospital admission  . Naproxen     Effects blood pressure and heart  . Nsaids     Effects blood pressure and heart  . Vantin     Pseudo colitis  . Vantin [Cefpodoxime]   . Vioxx [Rofecoxib]     Effects blood pressure and heart    Social History: The patient  reports that he quit smoking about 35 years ago. He has never  used smokeless tobacco. He reports that he does not drink alcohol or use drugs.   Family History: The patient's family history includes Heart failure in his father; Leukemia in his mother.   Review of Systems: Please see the history of present illness.   All other systems are reviewed and negative.   Physical Exam: VS:  BP 138/70   Pulse 71   Ht 5\' 9"  (1.753 m)   Wt 165 lb 12.8 oz (75.2 kg)   SpO2 98%   BMI 24.48 kg/m  .  BMI Body mass index is 24.48 kg/m.  Wt Readings from Last 3 Encounters:  02/26/19 165 lb 12.8 oz (75.2 kg)  01/15/19 165 lb (74.8 kg)  12/22/18 160 lb (72.6 kg)    General: Pleasant. Alert and in no acute distress. His weight is down from 171# at my last visit.  HEENT: Normal.  Neck: Supple, no JVD, carotid bruits, or masses noted.  Cardiac: Regular rate and rhythm. No murmurs, rubs, or gallops. No edema.  Respiratory:  Lungs are clear to auscultation bilaterally with normal work of breathing.  GI: Soft and nontender.  MS: No deformity or atrophy. Gait and ROM intact.  Skin: Warm and dry. Color is normal.  Neuro:  Strength and sensation are intact and no gross focal deficits noted.  Psych: Alert, appropriate and with normal affect.   LABORATORY DATA:  EKG:  EKG is not ordered today.   Lab Results  Component Value Date   WBC 4.8 12/11/2018   HGB 13.1 (L) 12/11/2018   HCT 39.0 12/11/2018   PLT 215 12/11/2018   GLUCOSE 92 12/11/2018   CHOL 131 02/15/2018   TRIG 82 02/15/2018   HDL 53 02/15/2018   LDLCALC 62 02/15/2018   ALT 17 12/11/2018   AST 20 12/11/2018   NA 138 12/11/2018   K 4.6 12/11/2018   CL 105 12/11/2018   CREATININE 0.92 12/11/2018   BUN 21 12/11/2018   CO2 26 12/11/2018   TSH 1.53 12/11/2018   HGBA1C 5.3 12/22/2018     BNP (last 3 results) No results for input(s): BNP in the last 8760 hours.  ProBNP (last 3 results) No results for input(s): PROBNP in the last 8760 hours.   Other Studies Reviewed Today:    Assessment/Plan:  1. Mild CAD - per remote cath - managed medically -  he has no symptoms.   2. Underlying PPM - seeing Dr. Rayann Heman in 08/2019  3. HTN - BP looks fine - no changes made today.   4. HLD - needs lipids today  5. Past renal mass - followed by [facility]- not discussed today.  6. Progressive memory issues - his daughters have taken an active role - he has a reminded calendar in place and doing a food diary.   7. Situation stress/grief - seems to be doing ok overall.   8. COVID-19 Education: The signs and symptoms of COVID-19 were discussed with the patient and how to seek care for testing (follow up with PCP or arrange E-visit).  The importance of social distancing, staying at home, hand hygiene and wearing a mask when out in public were discussed today.  Current medicines are reviewed with the patient today.  The patient does not have concerns regarding medicines other than what has been noted above.  The following changes have been made:  See above.  Labs/ tests ordered today include:   No orders of the defined types were placed in this encounter.    Disposition:   FU with Dr. Rayann Heman in February of 2021 and I will see him next August.     Patient is agreeable to this plan and will call if any problems develop in the interim.   SignedTruitt Merle, NP  02/26/2019 2:37 PM  [facility] [address] [place], [place]  [address] Phone: [phone] Fax: [phone]

## 2019-02-26 ENCOUNTER — Ambulatory Visit (INDEPENDENT_AMBULATORY_CARE_PROVIDER_SITE_OTHER): Payer: Medicare Other | Admitting: Nurse Practitioner

## 2019-02-26 ENCOUNTER — Other Ambulatory Visit: Payer: Self-pay

## 2019-02-26 ENCOUNTER — Encounter: Payer: Self-pay | Admitting: Nurse Practitioner

## 2019-02-26 VITALS — BP 138/70 | HR 71 | Ht 69.0 in | Wt 165.8 lb

## 2019-02-26 DIAGNOSIS — Z950 Presence of cardiac pacemaker: Secondary | ICD-10-CM | POA: Diagnosis not present

## 2019-02-26 DIAGNOSIS — I2510 Atherosclerotic heart disease of native coronary artery without angina pectoris: Secondary | ICD-10-CM

## 2019-02-26 DIAGNOSIS — E7849 Other hyperlipidemia: Secondary | ICD-10-CM

## 2019-02-26 DIAGNOSIS — F4321 Adjustment disorder with depressed mood: Secondary | ICD-10-CM

## 2019-02-26 DIAGNOSIS — I10 Essential (primary) hypertension: Secondary | ICD-10-CM | POA: Diagnosis not present

## 2019-02-26 DIAGNOSIS — Z7189 Other specified counseling: Secondary | ICD-10-CM | POA: Diagnosis not present

## 2019-02-26 NOTE — Patient Instructions (Addendum)
After Visit Summary:  We will be checking the following labs today - lipids and LFTS   Medication Instructions:    Continue with your current medicines.    If you need a refill on your cardiac medications before your next appointment, please call your pharmacy.     Testing/Procedures To Be Arranged:  N/A  Follow-Up:   See Dr. Rayann Heman in February 2021 as planned  See me in one year.     At [facility], you and your health needs are our priority.  As part of our continuing mission to provide you with exceptional heart care, we have created designated Provider Care Teams.  These Care Teams include your primary Cardiologist (physician) and Advanced Practice Providers (APPs -  Physician Assistants and Nurse Practitioners) who all work together to provide you with the care you need, when you need it.  Special Instructions:  . Stay safe, stay home, wash your hands for at least 20 seconds and wear a mask when out in public.  . It was good to talk with you today.    Call the [place] office at [phone] if you have any questions, problems or concerns.

## 2019-02-27 LAB — HEPATIC FUNCTION PANEL
ALT: 14 IU/L (ref 0–44)
AST: 18 IU/L (ref 0–40)
Albumin: 4.4 g/dL (ref 3.7–4.7)
Alkaline Phosphatase: 57 IU/L (ref 39–117)
Bilirubin Total: 0.2 mg/dL (ref 0.0–1.2)
Bilirubin, Direct: 0.09 mg/dL (ref 0.00–0.40)
Total Protein: 6.3 g/dL (ref 6.0–8.5)

## 2019-02-27 LAB — LIPID PANEL
Chol/HDL Ratio: 2.5 ratio (ref 0.0–5.0)
Cholesterol, Total: 115 mg/dL (ref 100–199)
HDL: 46 mg/dL (ref >39)
LDL Calculated: 55 mg/dL (ref 0–99)
Triglycerides: 72 mg/dL (ref 0–149)
VLDL Cholesterol Cal: 14 mg/dL (ref 5–40)

## 2019-03-09 DIAGNOSIS — R972 Elevated prostate specific antigen [PSA]: Secondary | ICD-10-CM | POA: Diagnosis not present

## 2019-03-10 ENCOUNTER — Other Ambulatory Visit: Payer: Self-pay | Admitting: Internal Medicine

## 2019-03-16 DIAGNOSIS — N401 Enlarged prostate with lower urinary tract symptoms: Secondary | ICD-10-CM | POA: Diagnosis not present

## 2019-03-16 DIAGNOSIS — R350 Frequency of micturition: Secondary | ICD-10-CM | POA: Diagnosis not present

## 2019-03-16 DIAGNOSIS — N402 Nodular prostate without lower urinary tract symptoms: Secondary | ICD-10-CM | POA: Diagnosis not present

## 2019-03-19 ENCOUNTER — Other Ambulatory Visit: Payer: Self-pay

## 2019-03-19 ENCOUNTER — Other Ambulatory Visit: Payer: Medicare Other | Admitting: Internal Medicine

## 2019-03-19 DIAGNOSIS — Z125 Encounter for screening for malignant neoplasm of prostate: Secondary | ICD-10-CM | POA: Diagnosis not present

## 2019-03-19 DIAGNOSIS — I2510 Atherosclerotic heart disease of native coronary artery without angina pectoris: Secondary | ICD-10-CM | POA: Diagnosis not present

## 2019-03-19 DIAGNOSIS — E7849 Other hyperlipidemia: Secondary | ICD-10-CM

## 2019-03-19 DIAGNOSIS — Z0000 Encounter for general adult medical examination without abnormal findings: Secondary | ICD-10-CM | POA: Diagnosis not present

## 2019-03-20 LAB — COMPLETE METABOLIC PANEL WITH GFR
AG Ratio: 2.1 (calc) (ref 1.0–2.5)
ALT: 14 U/L (ref 9–46)
AST: 18 U/L (ref 10–35)
Albumin: 4.2 g/dL (ref 3.6–5.1)
Alkaline phosphatase (APISO): 53 U/L (ref 35–144)
BUN: 23 mg/dL (ref 7–25)
CO2: 25 mmol/L (ref 20–32)
Calcium: 9.9 mg/dL (ref 8.6–10.3)
Chloride: 109 mmol/L (ref 98–110)
Creat: 0.93 mg/dL (ref 0.70–1.18)
GFR, Est African American: 90 mL/min/{1.73_m2} (ref >=60)
GFR, Est Non African American: 78 mL/min/{1.73_m2} (ref >=60)
Globulin: 2 g/dL (calc) (ref 1.9–3.7)
Glucose, Bld: 96 mg/dL (ref 65–99)
Potassium: 4.4 mmol/L (ref 3.5–5.3)
Sodium: 141 mmol/L (ref 135–146)
Total Bilirubin: 0.6 mg/dL (ref 0.2–1.2)
Total Protein: 6.2 g/dL (ref 6.1–8.1)

## 2019-03-20 LAB — CBC WITH DIFFERENTIAL/PLATELET
Absolute Monocytes: 387 cells/uL (ref 200–950)
Basophils Absolute: 40 cells/uL (ref 0–200)
Basophils Relative: 0.9 %
Eosinophils Absolute: 128 cells/uL (ref 15–500)
Eosinophils Relative: 2.9 %
HCT: 40.3 % (ref 38.5–50.0)
Hemoglobin: 13.3 g/dL (ref 13.2–17.1)
Lymphs Abs: 1038 cells/uL (ref 850–3900)
MCH: 32 pg (ref 27.0–33.0)
MCHC: 33 g/dL (ref 32.0–36.0)
MCV: 96.9 fL (ref 80.0–100.0)
MPV: 10.5 fL (ref 7.5–12.5)
Monocytes Relative: 8.8 %
Neutro Abs: 2807 cells/uL (ref 1500–7800)
Neutrophils Relative %: 63.8 %
Platelets: 204 10*3/uL (ref 140–400)
RBC: 4.16 10*6/uL — ABNORMAL LOW (ref 4.20–5.80)
RDW: 12.5 % (ref 11.0–15.0)
Total Lymphocyte: 23.6 %
WBC: 4.4 10*3/uL (ref 3.8–10.8)

## 2019-03-20 LAB — LIPID PANEL
Cholesterol: 132 mg/dL (ref <200)
HDL: 50 mg/dL (ref >=40)
LDL Cholesterol (Calc): 66 mg/dL (calc)
Non-HDL Cholesterol (Calc): 82 mg/dL (calc) (ref <130)
Total CHOL/HDL Ratio: 2.6 (calc) (ref <5.0)
Triglycerides: 79 mg/dL (ref <150)

## 2019-03-20 LAB — PSA: PSA: 0.7 ng/mL (ref <=4.0)

## 2019-03-26 ENCOUNTER — Ambulatory Visit (INDEPENDENT_AMBULATORY_CARE_PROVIDER_SITE_OTHER): Payer: Medicare Other | Admitting: Internal Medicine

## 2019-03-26 ENCOUNTER — Other Ambulatory Visit: Payer: Self-pay

## 2019-03-26 ENCOUNTER — Encounter: Payer: Self-pay | Admitting: Internal Medicine

## 2019-03-26 VITALS — BP 120/70 | HR 70 | Temp 98.3°F | Ht 69.0 in | Wt 166.0 lb

## 2019-03-26 DIAGNOSIS — Z950 Presence of cardiac pacemaker: Secondary | ICD-10-CM

## 2019-03-26 DIAGNOSIS — R634 Abnormal weight loss: Secondary | ICD-10-CM

## 2019-03-26 DIAGNOSIS — I10 Essential (primary) hypertension: Secondary | ICD-10-CM | POA: Diagnosis not present

## 2019-03-26 DIAGNOSIS — G4730 Sleep apnea, unspecified: Secondary | ICD-10-CM

## 2019-03-26 DIAGNOSIS — Z23 Encounter for immunization: Secondary | ICD-10-CM

## 2019-03-26 DIAGNOSIS — Z0000 Encounter for general adult medical examination without abnormal findings: Secondary | ICD-10-CM

## 2019-03-26 DIAGNOSIS — R413 Other amnesia: Secondary | ICD-10-CM

## 2019-03-26 DIAGNOSIS — R7302 Impaired glucose tolerance (oral): Secondary | ICD-10-CM

## 2019-03-26 DIAGNOSIS — E7849 Other hyperlipidemia: Secondary | ICD-10-CM | POA: Diagnosis not present

## 2019-03-26 DIAGNOSIS — I2510 Atherosclerotic heart disease of native coronary artery without angina pectoris: Secondary | ICD-10-CM

## 2019-03-26 LAB — POCT URINALYSIS DIPSTICK
Appearance: NEGATIVE
Bilirubin, UA: NEGATIVE
Blood, UA: NEGATIVE
Glucose, UA: NEGATIVE
Ketones, UA: NEGATIVE
Leukocytes, UA: NEGATIVE
Nitrite, UA: NEGATIVE
Odor: NEGATIVE
Protein, UA: NEGATIVE
Spec Grav, UA: 1.01 (ref 1.010–1.025)
Urobilinogen, UA: 0.2 E.U./dL
pH, UA: 7.5 (ref 5.0–8.0)

## 2019-03-26 NOTE — Progress Notes (Signed)
Subjective:    Patient ID: Robert Ramsey, male    DOB: June 03, 1941, 78 y.o.   MRN: [id number]  HPI 78 year old Male for medicare wellness exam, health maintenance exam and evaluation of medical issues. Accompanied by his daughter today. Earlier this year after he wife passed away, he lost some weight. Weight is now stable and slightly increased to 166 pounds. Was 160 pounds in June and increased to 165 ponds in July. He has some mild cognitive impairment but doing OK at home alone. Checking prealbumin and Hgb AIC today but all other labs drawn before CPE including CBC, C-met, PSA are normal.  History of partial right nephrectomy for renal mass by Dr. Karsten Ro in 2012.  History of pacemaker in place for Mobitz 2 AV block.  Only tolerates low-dose statin due to memory issues.  Had cardiac catheterization 2007 showing mild diffuse coronary artery disease.  He has a number of drug intolerances.  See list of allergies.  Most of these are intolerances rather than true allergies.  History of impaired glucose tolerance and GE reflux.  History of appendectomy.  Had cataract surgery December 2010, history of left bundle branch block.  Quit smoking in 1985.  History of sleep apnea.  History of sleep apnea treated with CPAP.   He had neuropsychological testing by Dr. Valentina Shaggy in 2016 and was diagnosed with mild cognitive impairment.  Report indicated he had memory impairment for acquisition storage and retrieval.  Ability to solve novel problems was dictation primarily due to apparent difficulty maintaining conceptual ideas within working memory.  Amnestic type. Has seen Dr Tish Frederickson May 2019.  He was cautioned regarding driving and advised to stay active with no follow-up recommended unless symptoms worsened.  Daughter thinks he is doing very well status post his wife's passing.  She and her sisters check in on him frequently.  History of nonexudative age-related macular degeneration followed by Dr.  Kathrin Penner.  Colonoscopy done January 2020 showing  internal and external hemorrhoids.  Also had diverticulosis.  Study was done by Dr. Watt Climes.  One colon polyp removed in transverse colon described as polypoid colonic mucosa negative for dysplasia.  Social history: He is a widower.  He continues to drive.  Has not gotten lost.  Former smoker.  Does not consume alcohol.  Family history: Father deceased due to heart failure and mother deceased due to leukemia.      Review of Systems  Constitutional: Negative for fatigue.  HENT: Negative for congestion, rhinorrhea, sinus pressure, sinus pain and sore throat.   Eyes: Negative for visual disturbance.  Respiratory: Negative.   Cardiovascular: Negative for chest pain and palpitations.  Gastrointestinal: Negative for abdominal pain, constipation and diarrhea.  Genitourinary: Negative.   Neurological: Negative for dizziness, syncope and weakness.       History of mild cognitive impairment based on testing by Dr. Valentina Shaggy in 2016.  Followed by neurology.       Objective:   Physical Exam Vitals signs reviewed.  Constitutional:      Appearance: Normal appearance.  HENT:     Head: Normocephalic and atraumatic.     Right Ear: Tympanic membrane and external ear normal.     Left Ear: Tympanic membrane and external ear normal.     Nose: Nose normal.     Mouth/Throat:     Mouth: Mucous membranes are moist.     Pharynx: Oropharynx is clear.  Eyes:     General: No scleral icterus.  Right eye: No discharge.        Left eye: No discharge.     Extraocular Movements: Extraocular movements intact.     Conjunctiva/sclera: Conjunctivae normal.     Pupils: Pupils are equal, round, and reactive to light.  Neck:     Musculoskeletal: Neck supple. No neck rigidity.     Vascular: No carotid bruit.  Cardiovascular:     Rate and Rhythm: Normal rate and regular rhythm.     Pulses: Normal pulses.     Heart sounds: Normal heart sounds.      Comments: 1/6 systolic ejection murmur Pulmonary:     Effort: Pulmonary effort is normal. No respiratory distress.     Breath sounds: Normal breath sounds. No wheezing or rales.  Abdominal:     General: Bowel sounds are normal. There is no distension.     Palpations: Abdomen is soft. There is no mass.     Tenderness: There is no left CVA tenderness or rebound.     Hernia: No hernia is present.  Genitourinary:    Prostate: Normal.  Musculoskeletal:        General: No deformity.     Right lower leg: No edema.     Left lower leg: No edema.  Lymphadenopathy:     Cervical: No cervical adenopathy.  Skin:    General: Skin is warm and dry.     Findings: No rash.  Neurological:     General: No focal deficit present.     Mental Status: He is alert and oriented to person, place, and time.     Cranial Nerves: No cranial nerve deficit.     Sensory: No sensory deficit.     Coordination: Coordination normal.     Gait: Gait normal.     Deep Tendon Reflexes: Reflexes normal.  Psychiatric:        Mood and Affect: Mood normal.        Behavior: Behavior normal.        Thought Content: Thought content normal.        Judgment: Judgment normal.    Blood pressure 120/70, pulse 70 regular, temperature 98.3, pulse oximetry 98% weight 166 pounds BMI 24.51       Assessment & Plan:  Mild cognitive deficit-seems stable at this point in time.  He is on low-dose atenolol 25 mg daily.  History pacemaker for Mobitz 2 AV block followed by Cardiology  History of weight loss after wife passed away but this has corrected.  Prealbumin drawn and pending  History of impaired glucose tolerance-hemoglobin A1c drawn and pending  Flu vaccine given  Labs reviewed and are within normal limits  History of GE reflux treated with Pepcid  Hypertension stable on low-dose Cozaar  History of mild diffuse coronary disease  Sleep apnea treated with CPAP  Hyperlipidemia treated with Zetia and Crestor  History  of macular degeneration followed by ophthalmology  Is seeing Dr. Karsten Ro with history of partial nephrectomy in the remote past.  Also takes Myrbetriq.  Plan: Follow-up in 6 months to check weight and see how he is doing.  Flu vaccine given today.  Subjective:   Patient presents for Medicare Annual/Subsequent preventive examination.  Review Past Medical/Family/Social: See above   Risk Factors  Current exercise habits: Walks some Dietary issues discussed: Low-fat low carbohydrate  Cardiac risk factors: Hyperlipidemia, history of Mobitz 2 AV block treated with pacemaker  Depression Screen  (Note: if answer to either of the following is "Yes", a more  complete depression screening is indicated)   Over the past two weeks, have you felt down, depressed or hopeless? No  Over the past two weeks, have you felt little interest or pleasure in doing things? No Have you lost interest or pleasure in daily life? No Do you often feel hopeless? No Do you cry easily over simple problems? No   Activities of Daily Living  In your present state of health, do you have any difficulty performing the following activities?:   Driving? No  Managing money? No  Feeding yourself? No  Getting from bed to chair? No  Climbing a flight of stairs? No  Preparing food and eating?: No  Bathing or showering? No  Getting dressed: No  Getting to the toilet? No  Using the toilet:No  Moving around from place to place: No  In the past year have you fallen or had a near fall?:No  Are you sexually active? No  Do you have more than one partner? No   Hearing Difficulties: No  Do you often ask people to speak up or repeat themselves? No  Do you experience ringing or noises in your ears? No  Do you have difficulty understanding soft or whispered voices?  Sometimes Do you feel that you have a problem with memory? No Do you often misplace items? No    Home Safety:  Do you have a smoke alarm at your residence? Yes  Do you have grab bars in the bathroom?  Yes Do you have throw rugs in your house?  Yes   Cognitive Testing  Alert? Yes Normal Appearance?Yes  Oriented to person? Yes Place? Yes  Time? Yes  Recall of three objects?  Not tested Can perform simple calculations? Yes  Displays appropriate judgment?Yes  Can read the correct time from a watch face?Yes   List the Names of Other Physician/Practitioners you currently use:  See referral list for the physicians patient is currently seeing.  Cardiology  Dr. Karsten Ro  Dr. Watt Climes is his GI physician   Review of Systems: See above   Objective:     General appearance: Appears stated age  Head: Normocephalic, without obvious abnormality, atraumatic  Eyes: conj clear, EOMi PEERLA  Ears: normal TM's and external ear canals both ears  Nose: Nares normal. Septum midline. Mucosa normal. No drainage or sinus tenderness.  Throat: lips, mucosa, and tongue normal; teeth and gums normal  Neck: no adenopathy, no carotid bruit, no JVD, supple, symmetrical, trachea midline and thyroid not enlarged, symmetric, no tenderness/mass/nodules  No CVA tenderness.  Lungs: clear to auscultation bilaterally  Breasts: normal appearance.  Heart: regular rate and rhythm, S1, S2 normal, no murmur, click, rub or gallop  Abdomen: soft, non-tender; bowel sounds normal; no masses, no organomegaly  Musculoskeletal: ROM normal in all joints, no crepitus, no deformity, Normal muscle strengthen. Back  is symmetric, no curvature. Skin: Skin color, texture, turgor normal. No rashes or lesions  Lymph nodes: Cervical, supraclavicular, and axillary nodes normal.  Neurologic: CN 2 -12 Normal, Normal symmetric reflexes. Normal coordination and gait  Psych: Alert & Oriented x 3, Mood appear stable.    Assessment:    Annual wellness medicare exam   Plan:    During the course of the visit the patient was educated and counseled about appropriate screening and preventive services  including:   Annual flu vaccine     Patient Instructions (the written plan) was given to the patient.  Medicare Attestation  I have personally reviewed:  The patient's  medical and social history  Their use of alcohol, tobacco or illicit drugs  Their current medications and supplements  The patient's functional ability including ADLs,fall risks, home safety risks, cognitive, and hearing and visual impairment  Diet and physical activities  Evidence for depression or mood disorders  The patient's weight, height, BMI, and visual acuity have been recorded in the chart. I have made referrals, counseling, and provided education to the patient based on review of the above and I have provided the patient with a written personalized care plan for preventive services.

## 2019-03-26 NOTE — Patient Instructions (Addendum)
It was a pleasure to see you. Flu vaccine given. Check prealbumin and Hgb AIC today. Family is supportive. Follow up in 6 months.

## 2019-03-29 LAB — PREALBUMIN: Prealbumin: 22 mg/dL (ref 21–43)

## 2019-03-29 LAB — HEMOGLOBIN A1C
Hgb A1c MFr Bld: 5.4 % of total Hgb (ref <5.7)
Mean Plasma Glucose: 108 (calc)
eAG (mmol/L): 6 (calc)

## 2019-04-03 ENCOUNTER — Other Ambulatory Visit: Payer: Self-pay

## 2019-04-03 ENCOUNTER — Ambulatory Visit (INDEPENDENT_AMBULATORY_CARE_PROVIDER_SITE_OTHER): Payer: Medicare Other | Admitting: Adult Health

## 2019-04-03 ENCOUNTER — Encounter: Payer: Self-pay | Admitting: Adult Health

## 2019-04-03 VITALS — BP 113/67 | HR 88 | Temp 97.7°F | Ht 69.0 in | Wt 169.0 lb

## 2019-04-03 DIAGNOSIS — Z9989 Dependence on other enabling machines and devices: Secondary | ICD-10-CM | POA: Diagnosis not present

## 2019-04-03 DIAGNOSIS — R413 Other amnesia: Secondary | ICD-10-CM | POA: Diagnosis not present

## 2019-04-03 DIAGNOSIS — G4733 Obstructive sleep apnea (adult) (pediatric): Secondary | ICD-10-CM | POA: Diagnosis not present

## 2019-04-03 DIAGNOSIS — I2510 Atherosclerotic heart disease of native coronary artery without angina pectoris: Secondary | ICD-10-CM | POA: Diagnosis not present

## 2019-04-03 NOTE — Progress Notes (Signed)
PATIENT: Robert Ramsey DOB: December 04, 1940  REASON FOR VISIT: follow up HISTORY FROM: patient  HISTORY OF PRESENT ILLNESS: Today 04/03/19:  Robert Ramsey is a 78 year old male with a history of obstructive sleep apnea on CPAP.  His download indicates that he uses machine nightly for compliance of 100%.  He uses machine greater than 4 hours each night.  On average he uses his machine 8 hours and 7 minutes.  His residual AHI is 0.3 on 9 cm of water with EPR 3.  He does not have a significant leak.  He denies any significant changes with his memory.  He does report that his wife passed away in 12/19/22.  His daughter is with him today.  She states that she has noticed him be a little more forgetful but does not think he is unsafe to live by himself or drive.  She is unsure if the forgetfulness may be due to grief.  He is able to complete all ADLs independently.  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  He returns today for an evaluation.  HISTORY 12/12/17:  Robert Ramsey is a 78 year old male with a history of obstructive sleep apnea on CPAP.  His CPAP download indicates that he use his machine nightly for compliance of 100%.  He uses machine greater than 4 hours each night.  On average he uses his machine 7 hours and 49 minutes.  His residual AHI is 0.2 on 9 mm of water with EPR 3.  He does not have a significant leak.  Overall the patient has done well.  His Epworth sleepiness score is 9.  He feels that the CPAP continues to work well for him.  He recently saw Dr. Leta Baptist and his memory score has remained stable.  He returns today for evaluation.   REVIEW OF SYSTEMS: Out of a complete 14 system review of symptoms, the patient complains only of the following symptoms, and all other reviewed systems are negative.  See HPI  ALLERGIES: Allergies  Allergen Reactions  . Acyclovir And Related   . Cinnamon     Cinnamon toothpaste caused him to break out inside his mouth and lesions  were removed  . Flexeril [Cyclobenzaprine Hcl]     Possible heart side effects  . Imodium [Loperamide Hcl]     unknown  . Levofloxacin Other (See Comments)    Elevated heart rate  . Nabumetone     REACTION: Heart issues/hospital admission  . Naproxen     Effects blood pressure and heart  . Nsaids     Effects blood pressure and heart  . Vantin     Pseudo colitis  . Vantin [Cefpodoxime]   . Vioxx [Rofecoxib]     Effects blood pressure and heart    HOME MEDICATIONS: Outpatient Medications Prior to Visit  Medication Sig Dispense Refill  . aspirin 81 MG tablet Take 81 mg by mouth daily.    Marland Kitchen ezetimibe (ZETIA) 10 MG tablet TAKE 1 TABLET BY MOUTH  DAILY 90 tablet 3  . famotidine (PEPCID) 20 MG tablet Take 20 mg by mouth 2 (two) times daily.    Marland Kitchen losartan (COZAAR) 25 MG tablet TAKE 1 TABLET BY MOUTH  DAILY 90 tablet 3  . Multiple Vitamin (MULTIVITAMIN) tablet Take 1 tablet by mouth daily.      Marland Kitchen MYRBETRIQ 50 MG TB24 tablet Take 50 mg by mouth daily.    . rosuvastatin (CRESTOR) 5 MG tablet Take 1/2 tablet by mouth  three times a week. 45 tablet 2  . sertraline (ZOLOFT) 25 MG tablet TAKE 1 TABLET BY MOUTH  DAILY 90 tablet 3  . UNABLE TO FIND Med Name: [employer]ch [employer] Lomb preservision eye vitamin and mineral supplement     No facility-administered medications prior to visit.     PAST MEDICAL HISTORY: Past Medical History:  Diagnosis Date  . Abdominal pain   . Abnormal stress test 09/15/10   SEPTAL DEFECT SECONDARY TO PREVIOUS INFARCT VS LBBB; MILD SEPTAL ISCHEMIA  . Acid reflux   . CAD (coronary artery disease)    cath in 2007 reveals mild diffuse CAD  . Colon polyps   . Complete heart block [facility]) May 2011   has PTVP in place  . Diverticulosis   . Glucose intolerance (impaired glucose tolerance)   . Hemorrhoids   . Hiatal hernia   . History of colonoscopy   . HTN (hypertension)   . Hypercholesterolemia   . Memory impairment    better with lower dose statin  . Pacemaker   .  PVC's (premature ventricular contractions)   . Sinusitis   . Sleep apnea   . Ventral hernia     PAST SURGICAL HISTORY: Past Surgical History:  Procedure Laterality Date  . APPENDECTOMY    . CARDIAC CATHETERIZATION  07/29/2005   EF 50-55%; Has mild diffuse CAD  . cataract surgery  06/2009, 10/2014  . COLONOSCOPY    . CPAP    . LBBB    . NOSE SURGERY    . PACEMAKER INSERTION  11/18/09   MDT implanted by Dr Doreatha Lew  . REFRACTIVE SURGERY Bilateral   . right nephrectomy  01/2006   partial for mass    FAMILY HISTORY: Family History  Problem Relation Age of Onset  . Heart failure Father   . Leukemia Mother     SOCIAL HISTORY: Social History   Socioeconomic History  . Marital status: Married    Spouse name: [name]  . Number of children: 4  . Years of education: 48  . Highest education level: Not on file  Occupational History  . Occupation: Retired    Comment: [employer]  Social Needs  . Financial resource strain: Not on file  . Food insecurity    Worry: Not on file    Inability: Not on file  . Transportation needs    Medical: Not on file    Non-medical: Not on file  Tobacco Use  . Smoking status: Former Smoker    Quit date: 07/06/1983    Years since quitting: 35.7  . Smokeless tobacco: Never Used  . Tobacco comment: stopped smoking in 1985  Substance and Sexual Activity  . Alcohol use: No  . Drug use: No  . Sexual activity: Not Currently  Lifestyle  . Physical activity    Days per week: Not on file    Minutes per session: Not on file  . Stress: Not on file  Relationships  . Social Herbalist on phone: Not on file    Gets together: Not on file    Attends religious service: Not on file    Active member of club or organization: Not on file    Attends meetings of clubs or organizations: Not on file    Relationship status: Not on file  . Intimate partner violence    Fear of current or ex partner: Not on file    Emotionally abused: Not on file     Physically abused:  Not on file    Forced sexual activity: Not on file  Other Topics Concern  . Not on file  Social History Narrative   Retired from [employer], [employer].   Lives at home with wife   Caffeine use- morning coffee and 2 cup/ 1 cup of tea at lunch       PHYSICAL EXAM  Vitals:   04/03/19 1048  BP: 113/67  Pulse: 88  Temp: 97.7 F (36.5 C)  TempSrc: Oral  Weight: 169 lb (76.7 kg)  Height: 5\' 9"  (1.753 m)   Body mass index is 24.96 kg/m.   MMSE - Mini Mental State Exam 04/03/2019 11/23/2017 11/22/2016  Orientation to time 4 5 5   Orientation to Place 5 5 5   Registration 3 3 3   Attention/ Calculation 4 4 5   Recall 1 2 1   Language- name 2 objects 2 2 2   Language- repeat 1 1 1   Language- follow 3 step command 3 3 3   Language- read & follow direction 1 1 1   Write a sentence 1 1 1   Copy design 1 1 1   Total score 26 28 28      Generalized: Well developed, in no acute distress  Chest: Lungs clear to auscultation bilaterally  Neurological examination  Mentation: Alert oriented to time, place, history taking. Follows all commands speech and language fluent Cranial nerve II-XII: Extraocular movements were full, visual field were full on confrontational test Head turning and shoulder shrug  were normal and symmetric. Motor: The motor testing reveals 5 over 5 strength of all 4 extremities. Good symmetric motor tone is noted throughout.  Sensory: Sensory testing is intact to soft touch on all 4 extremities. No evidence of extinction is noted.  Gait and station: Gait is normal.    DIAGNOSTIC DATA (LABS, IMAGING, TESTING) - I reviewed patient records, labs, notes, testing and imaging myself where available.  Lab Results  Component Value Date   WBC 4.4 03/19/2019   HGB 13.3 03/19/2019   HCT 40.3 03/19/2019   MCV 96.9 03/19/2019   PLT 204 03/19/2019      Component Value Date/Time   NA 141 03/19/2019 1040   NA 143 02/15/2018 1125   K 4.4 03/19/2019 1040   CL  109 03/19/2019 1040   CO2 25 03/19/2019 1040   GLUCOSE 96 03/19/2019 1040   BUN 23 03/19/2019 1040   BUN 22 02/15/2018 1125   CREATININE 0.93 03/19/2019 1040   CALCIUM 9.9 03/19/2019 1040   PROT 6.2 03/19/2019 1040   PROT 6.3 02/26/2019 1450   ALBUMIN 4.4 02/26/2019 1450   AST 18 03/19/2019 1040   ALT 14 03/19/2019 1040   ALKPHOS 57 02/26/2019 1450   BILITOT 0.6 03/19/2019 1040   BILITOT 0.2 02/26/2019 1450   GFRNONAA 78 03/19/2019 1040   GFRAA 90 03/19/2019 1040   Lab Results  Component Value Date   CHOL 132 03/19/2019   HDL 50 03/19/2019   LDLCALC 66 03/19/2019   TRIG 79 03/19/2019   CHOLHDL 2.6 03/19/2019   Lab Results  Component Value Date   HGBA1C 5.4 03/26/2019   Lab Results  Component Value Date   [id number] 03/17/2017   Lab Results  Component Value Date   TSH 1.53 12/11/2018      ASSESSMENT AND PLAN 78 y.o. year old male  has a past medical history of Abdominal pain, Abnormal stress test (09/15/10), Acid reflux, CAD (coronary artery disease), Colon polyps, Complete heart block [facility]) (May 2011), Diverticulosis, Glucose intolerance (impaired glucose  tolerance), Hemorrhoids, Hiatal hernia, History of colonoscopy, HTN (hypertension), Hypercholesterolemia, Memory impairment, Pacemaker, PVC's (premature ventricular contractions), Sinusitis, Sleep apnea, and Ventral hernia. here with:  1. Obstructive sleep apnea on CPAP 2. 2.  Memory disturbance  The patient's CPAP download shows excellent compliance and good treatment of his apnea.  He is encouraged to continue using CPAP nightly and greater than 4 hours each night.  Memory score has remained stable.  We will continue to monitor.  He is advised that if his symptoms worsen or he develops new symptoms he should let us know.  He will follow-up in 6 months to evaluate memory.     Ward Givens, MSN, NP-C 04/03/2019, 10:31 AM [facility] [address] [place], [place] [address] [phone]

## 2019-04-03 NOTE — Patient Instructions (Signed)
Your Plan:  Continue using CPAP nightly and greater than 4 hours each night Monitor memory If your symptoms worsen or you develop new symptoms please let us know.   Thank you for coming to see [place] at [facility]. I hope we have been able to provide you high quality care today.  You may receive a patient satisfaction survey over the next few weeks. We would appreciate your feedback and comments so that we may continue to improve ourselves and the health of our patients.

## 2019-04-26 ENCOUNTER — Other Ambulatory Visit: Payer: Self-pay | Admitting: Internal Medicine

## 2019-04-26 MED ORDER — MUPIROCIN 2 % EX OINT: 1.0000 "application " | TOPICAL_OINTMENT | Freq: Two times a day (BID) | CUTANEOUS | 0 refills | Status: DC

## 2019-04-26 NOTE — Telephone Encounter (Signed)
Robert Ramsey [phone]  [facility] - [address] -  mupirocin ointment (BACTROBAN) 2 % -- last written 05/03/2017  [name] called to say that he has some irritation in the opening of his nostrils and would like to get above medication sent to his pharmacy.

## 2019-05-03 ENCOUNTER — Telehealth: Payer: Self-pay | Admitting: Internal Medicine

## 2019-05-03 NOTE — Telephone Encounter (Signed)
Called to let patient know what Dr Renold Genta said, and for him to continue monitoring symptoms and keep check on temperature. Call back is symptoms worsen. Patient verbalized understanding

## 2019-05-03 NOTE — Telephone Encounter (Signed)
Robert Ramsey [id number]  [name] called to say he has some sinus congestion, eyes burning, slight ache in forehead, no fever, no COVID exposure, he is taking [facility] cold medicine, should he be doing anything else. Temperature 97.4 a little bit ago.

## 2019-05-03 NOTE — Telephone Encounter (Signed)
Continue to monitor symptoms and watch temp. Call if febrile or symptoms worsen.

## 2019-05-16 ENCOUNTER — Ambulatory Visit (INDEPENDENT_AMBULATORY_CARE_PROVIDER_SITE_OTHER): Payer: Medicare Other | Admitting: *Deleted

## 2019-05-16 DIAGNOSIS — I441 Atrioventricular block, second degree: Secondary | ICD-10-CM

## 2019-05-16 DIAGNOSIS — I442 Atrioventricular block, complete: Secondary | ICD-10-CM

## 2019-05-16 LAB — CUP PACEART REMOTE DEVICE CHECK
Battery Impedance: 960 Ohm
Battery Remaining Longevity: 62 mo
Battery Voltage: 2.78 V
Brady Statistic AP VP Percent: 8 %
Brady Statistic AP VS Percent: 0 %
Brady Statistic AS VP Percent: 91 %
Brady Statistic AS VS Percent: 0 %
Date Time Interrogation Session: 20201111085012
Implantable Lead Implant Date: 20110517
Implantable Lead Implant Date: 20110517
Implantable Lead Location: 753859
Implantable Lead Location: 753860
Implantable Lead Model: 4469
Implantable Lead Model: 4470
Implantable Lead Serial Number: 543103
Implantable Lead Serial Number: 672341
Implantable Pulse Generator Implant Date: 20110517
Lead Channel Impedance Value: 619 Ohm
Lead Channel Impedance Value: 649 Ohm
Lead Channel Pacing Threshold Amplitude: 0.375 V
Lead Channel Pacing Threshold Amplitude: 0.5 V
Lead Channel Pacing Threshold Pulse Width: 0.4 ms
Lead Channel Pacing Threshold Pulse Width: 0.4 ms
Lead Channel Setting Pacing Amplitude: 2 V
Lead Channel Setting Pacing Amplitude: 2.5 V
Lead Channel Setting Pacing Pulse Width: 0.4 ms
Lead Channel Setting Sensing Sensitivity: 2 mV

## 2019-05-22 ENCOUNTER — Telehealth: Payer: Self-pay | Admitting: Internal Medicine

## 2019-05-22 NOTE — Telephone Encounter (Signed)
Suggest Flonase nasal spray over the counter 2 sprays each nostril daily and see if that helps.

## 2019-05-22 NOTE — Telephone Encounter (Signed)
Called and spoke with patient he verbalized understanding

## 2019-05-22 NOTE — Telephone Encounter (Signed)
Robert Ramsey [phone]  [name] called to say that he has sinus aching around forehead and eyes aching, he has been taking Sudafed for the last 2-3 days it helps but does not clear it up. What can he do? No Fever! No COVID exposure that he knows of.

## 2019-06-06 NOTE — Progress Notes (Signed)
Remote pacemaker transmission.   

## 2019-07-19 ENCOUNTER — Telehealth: Payer: Self-pay | Admitting: Internal Medicine

## 2019-07-19 NOTE — Telephone Encounter (Signed)
Agree set up virtual visit

## 2019-07-19 NOTE — Telephone Encounter (Signed)
Robert Ramsey [phone]  [name] called to say he went out to eat with a friend and while there he told him that he had COVID and so had his family. He had tested negative 2 days prior to their getting together, however the friend still had sniffles while and breakfast. Robert Ramsey is now concerned that he may have been exposed. Would like to talk with you about what he should do. I let him know it would need to be virtual visit.

## 2019-07-19 NOTE — Telephone Encounter (Signed)
Called Chistian back to set up virtual visit, his exposure was today.

## 2019-07-20 ENCOUNTER — Other Ambulatory Visit: Payer: Self-pay

## 2019-07-20 ENCOUNTER — Ambulatory Visit (INDEPENDENT_AMBULATORY_CARE_PROVIDER_SITE_OTHER): Payer: Medicare Other | Admitting: Internal Medicine

## 2019-07-20 ENCOUNTER — Encounter: Payer: Self-pay | Admitting: Internal Medicine

## 2019-07-20 VITALS — Ht 69.0 in | Wt 169.0 lb

## 2019-07-20 DIAGNOSIS — I10 Essential (primary) hypertension: Secondary | ICD-10-CM | POA: Diagnosis not present

## 2019-07-20 DIAGNOSIS — Z950 Presence of cardiac pacemaker: Secondary | ICD-10-CM | POA: Diagnosis not present

## 2019-07-20 DIAGNOSIS — Z20822 Contact with and (suspected) exposure to covid-19: Secondary | ICD-10-CM | POA: Diagnosis not present

## 2019-07-20 DIAGNOSIS — R413 Other amnesia: Secondary | ICD-10-CM

## 2019-07-20 DIAGNOSIS — R7302 Impaired glucose tolerance (oral): Secondary | ICD-10-CM

## 2019-07-20 NOTE — Progress Notes (Signed)
   Subjective:    Patient ID: JONN MILIA, male    DOB: [birth year], 79 y.o.   MRN: [place]:[id number]  HPI 79 year old Male with history of memory loss was inadvertently exposed to a friend who had tested positive for COVID-19 at [employer] on [address] yesterday.  Patient entered the restaurant and sat down at a table where his friend was and subsequently went on to tell him he and his family had tested positive for COVID-19.  Patient says that this individual still had sniffles but told him that he had tested -2 days ago.  He was around this person for some 10 to 15 minutes.  His family is now concerned about his exposure.  Patient was not wearing a mask during part of this time with his friend.  Due to the coronavirus pandemic, we attempted interactive audio and video telecommunications with him.  However, he has memory loss and was not able to connect virtually so we continued by telephone alone.  He is identified using 2 identifiers as [name], a longstanding patient in this practice  He is Covid risk of complication score is 6.  He is a widower and resides alone.  His family is supportive.  Review of Systems currently reports no issues with fever, respiratory congestion, shortness of breath, cough, or dysgeusia.     Objective:   Physical Exam  Reports no fever-connected by telephone only      Assessment & Plan:  Close exposure to COVID-19  History of memory loss  History of pacemaker  History of impaired glucose tolerance  Essential hypertension  Plan: He will be tested today for COVID-19 and is to quarantine until results are back.  He is agreeable with this plan.  15 minutes spent including medical decision making, interviewing patient, reviewing medical records and arranging for COVID-19 testing.

## 2019-07-20 NOTE — Patient Instructions (Signed)
Patient is to have COVID-19 testing at test center.  He is to quarantine until test results are back.

## 2019-08-03 ENCOUNTER — Ambulatory Visit: Payer: Medicare Other

## 2019-08-08 ENCOUNTER — Other Ambulatory Visit: Payer: Self-pay | Admitting: Internal Medicine

## 2019-08-15 ENCOUNTER — Ambulatory Visit (INDEPENDENT_AMBULATORY_CARE_PROVIDER_SITE_OTHER): Payer: Medicare Other | Admitting: *Deleted

## 2019-08-15 DIAGNOSIS — I442 Atrioventricular block, complete: Secondary | ICD-10-CM | POA: Diagnosis not present

## 2019-08-15 LAB — CUP PACEART REMOTE DEVICE CHECK
Battery Impedance: 1011 Ohm
Battery Remaining Longevity: 60 mo
Battery Voltage: 2.78 V
Brady Statistic AP VP Percent: 8 %
Brady Statistic AP VS Percent: 0 %
Brady Statistic AS VP Percent: 92 %
Brady Statistic AS VS Percent: 0 %
Date Time Interrogation Session: 20210210084631
Implantable Lead Implant Date: 20110517
Implantable Lead Implant Date: 20110517
Implantable Lead Location: 753859
Implantable Lead Location: 753860
Implantable Lead Model: 4469
Implantable Lead Model: 4470
Implantable Lead Serial Number: 543103
Implantable Lead Serial Number: 672341
Implantable Pulse Generator Implant Date: 20110517
Lead Channel Impedance Value: 630 Ohm
Lead Channel Impedance Value: 661 Ohm
Lead Channel Pacing Threshold Amplitude: 0.375 V
Lead Channel Pacing Threshold Amplitude: 0.5 V
Lead Channel Pacing Threshold Pulse Width: 0.4 ms
Lead Channel Pacing Threshold Pulse Width: 0.4 ms
Lead Channel Setting Pacing Amplitude: 2 V
Lead Channel Setting Pacing Amplitude: 2.5 V
Lead Channel Setting Pacing Pulse Width: 0.4 ms
Lead Channel Setting Sensing Sensitivity: 2 mV

## 2019-08-15 NOTE — Progress Notes (Signed)
PPM Remote  

## 2019-08-27 ENCOUNTER — Encounter: Payer: Self-pay | Admitting: Internal Medicine

## 2019-08-27 ENCOUNTER — Other Ambulatory Visit: Payer: Self-pay

## 2019-08-27 ENCOUNTER — Telehealth (INDEPENDENT_AMBULATORY_CARE_PROVIDER_SITE_OTHER): Payer: Medicare Other | Admitting: Internal Medicine

## 2019-08-27 VITALS — Ht 69.0 in | Wt 175.0 lb

## 2019-08-27 DIAGNOSIS — I10 Essential (primary) hypertension: Secondary | ICD-10-CM | POA: Diagnosis not present

## 2019-08-27 DIAGNOSIS — I2510 Atherosclerotic heart disease of native coronary artery without angina pectoris: Secondary | ICD-10-CM

## 2019-08-27 DIAGNOSIS — I442 Atrioventricular block, complete: Secondary | ICD-10-CM

## 2019-08-27 DIAGNOSIS — E7849 Other hyperlipidemia: Secondary | ICD-10-CM

## 2019-08-27 NOTE — Progress Notes (Signed)
Electrophysiology TeleHealth Note  Due to national recommendations of social distancing due to [place] 19, an audio telehealth visit is felt to be most appropriate for this patient at this time.  Verbal consent was obtained by me for the telehealth visit today.  The patient does not have capability for a virtual visit.  A phone visit is therefore required today.   Date:  08/27/2019   ID:  [name], [name] Aug 03, 1940, MRN [place]:[id number]  Location: patient's home  Provider location:  [place]  Evaluation Performed: Follow-up visit  PCP:  Elby Showers, MD   Electrophysiologist:  Dr Rayann Heman  Chief Complaint:  HTN  History of Present Illness:    Robert Ramsey is a 79 y.o. male who presents via telehealth conferencing today.  Since last being seen in our clinic, the patient reports doing reasonably well.  His wife died this past may with stage IV lung CA.  His daughters are active in his life.   Today, he denies symptoms of palpitations, chest pain, shortness of breath,  lower extremity edema, dizziness, presyncope, or syncope.  The patient is otherwise without complaint today.  The patient denies symptoms of fevers, chills, cough, or new SOB worrisome for COVID 19.  Past Medical History:  Diagnosis Date  . Abdominal pain   . Abnormal stress test 09/15/10   SEPTAL DEFECT SECONDARY TO PREVIOUS INFARCT VS LBBB; MILD SEPTAL ISCHEMIA  . Acid reflux   . CAD (coronary artery disease)    cath in 2007 reveals mild diffuse CAD  . Colon polyps   . Complete heart block [facility]) May 2011   has PTVP in place  . Diverticulosis   . Glucose intolerance (impaired glucose tolerance)   . Hemorrhoids   . Hiatal hernia   . History of colonoscopy   . HTN (hypertension)   . Hypercholesterolemia   . Memory impairment    better with lower dose statin  . Pacemaker   . PVC's (premature ventricular contractions)   . Sinusitis   . Sleep apnea   . Ventral hernia     Past Surgical History:    Procedure Laterality Date  . APPENDECTOMY    . CARDIAC CATHETERIZATION  07/29/2005   EF 50-55%; Has mild diffuse CAD  . cataract surgery  06/2009, 10/2014  . COLONOSCOPY    . CPAP    . LBBB    . NOSE SURGERY    . PACEMAKER INSERTION  11/18/09   MDT implanted by Dr Doreatha Lew  . REFRACTIVE SURGERY Bilateral   . right nephrectomy  01/2006   partial for mass    Current Outpatient Medications  Medication Sig Dispense Refill  . aspirin 81 MG tablet Take 81 mg by mouth daily.    Marland Kitchen ezetimibe (ZETIA) 10 MG tablet TAKE 1 TABLET BY MOUTH  DAILY 90 tablet 3  . famotidine (PEPCID) 20 MG tablet Take 20 mg by mouth 2 (two) times daily.    Marland Kitchen losartan (COZAAR) 25 MG tablet TAKE 1 TABLET BY MOUTH  DAILY 90 tablet 3  . Multiple Vitamin (MULTIVITAMIN) tablet Take 1 tablet by mouth daily.      . Multiple Vitamins-Minerals (PRESERVISION AREDS 2 PO) Take 1 tablet by mouth 2 (two) times daily.    Marland Kitchen MYRBETRIQ 50 MG TB24 tablet Take 50 mg by mouth daily.    . rosuvastatin (CRESTOR) 5 MG tablet Take 1/2 tablet by mouth three times a week. Please keep upcoming appt for refills. Thank you 20  tablet 0  . sertraline (ZOLOFT) 25 MG tablet TAKE 1 TABLET BY MOUTH  DAILY 90 tablet 3   No current facility-administered medications for this visit.    Allergies:   Acyclovir and related, Cinnamon, Flexeril [cyclobenzaprine hcl], Imodium [loperamide hcl], Levofloxacin, Nabumetone, Naproxen, Nsaids, Vantin, Vantin [cefpodoxime], and Vioxx [rofecoxib]   Social History:  The patient  reports that he quit smoking about 36 years ago. He has never used smokeless tobacco. He reports that he does not drink alcohol or use drugs.   Family History:  The patient's family history includes Heart failure in his father; Leukemia in his mother.   ROS:  Please see the history of present illness.   All other systems are personally reviewed and negative.    Exam:    Vital Signs:  Ht 5\' 9"  (1.753 m)   Wt 175 lb (79.4 kg)   BMI 25.84  kg/m   Well sounding, alert and conversant   Labs/Other Tests and Data Reviewed:    Recent Labs: 12/11/2018: TSH 1.53 03/19/2019: ALT 14; BUN 23; Creat 0.93; Hemoglobin 13.3; Platelets 204; Potassium 4.4; Sodium 141   Wt Readings from Last 3 Encounters:  08/27/19 175 lb (79.4 kg)  07/20/19 169 lb (76.7 kg)  04/03/19 169 lb (76.7 kg)     Last device remote is reviewed from [place] PDF which reveals normal device function, no arrhythmias    ASSESSMENT & PLAN:    1.  Complete heart block Remotes are up to date Normal device function  2. HTN Stable No change required today BP on January 10 was 140/64  3. HL Continue on crestor and zetia  4. OSA Compliant with CPAP  5. CAD Mild diffuse disease by remote cath No ischemic symptoms   Follow-up:  with me in a year He follows with Truitt Merle also.  She will see him in August  Patient Risk:  after full review of this patients clinical status, I feel that they are at moderate risk at this time.  Today, I have spent 15 minutes with the patient with telehealth technology discussing arrhythmia management .    Army Fossa, MD  08/27/2019 9:31 AM     [facility] [address] [place] [address] [phone] (office) [phone] (fax)

## 2019-09-17 DIAGNOSIS — H353132 Nonexudative age-related macular degeneration, bilateral, intermediate dry stage: Secondary | ICD-10-CM | POA: Diagnosis not present

## 2019-09-17 DIAGNOSIS — H43813 Vitreous degeneration, bilateral: Secondary | ICD-10-CM | POA: Diagnosis not present

## 2019-09-17 DIAGNOSIS — Z961 Presence of intraocular lens: Secondary | ICD-10-CM | POA: Diagnosis not present

## 2019-10-01 ENCOUNTER — Ambulatory Visit (INDEPENDENT_AMBULATORY_CARE_PROVIDER_SITE_OTHER): Payer: Medicare Other | Admitting: Adult Health

## 2019-10-01 ENCOUNTER — Other Ambulatory Visit: Payer: Self-pay

## 2019-10-01 ENCOUNTER — Encounter: Payer: Self-pay | Admitting: Adult Health

## 2019-10-01 VITALS — BP 128/68 | HR 85 | Temp 97.7°F | Ht 69.0 in | Wt 178.0 lb

## 2019-10-01 DIAGNOSIS — I2510 Atherosclerotic heart disease of native coronary artery without angina pectoris: Secondary | ICD-10-CM

## 2019-10-01 DIAGNOSIS — G4733 Obstructive sleep apnea (adult) (pediatric): Secondary | ICD-10-CM | POA: Diagnosis not present

## 2019-10-01 DIAGNOSIS — R413 Other amnesia: Secondary | ICD-10-CM

## 2019-10-01 DIAGNOSIS — Z9989 Dependence on other enabling machines and devices: Secondary | ICD-10-CM | POA: Diagnosis not present

## 2019-10-01 NOTE — Progress Notes (Addendum)
PATIENT: Robert Ramsey DOB: [birth year]  REASON FOR VISIT: follow up HISTORY FROM: patient  HISTORY OF PRESENT ILLNESS: Today 10/01/19:  Mr. Rehfeldt is an 79 year old male with a history of obstructive sleep apnea on CPAP.  His download indicates that he uses machine nightly for compliance of 100%.  He uses machine greater than 4 hours 29 days for compliance of 97%.  On average he uses his machine 8 hours and 15 minutes.  His residual AHI is 0.4 on 9 cm of water with EPR 3.  Leak in the 95th percentile is 13 L/min he reports that the CPAP is working well for him.    He feels that his memory has remained stable.  He currently lives at home alone.  He is able to complete all ADLs independently.  He operates a Teacher, music without difficulty.  He is able to manage his own finances.  His daughter reports that he is repetitive other than that he has been doing well.  He returns today for an evaluation.  HISTORY 04/03/19:  Mr. Giovino is a 79 year old male with a history of obstructive sleep apnea on CPAP.  His download indicates that he uses machine nightly for compliance of 100%.  He uses machine greater than 4 hours each night.  On average he uses his machine 8 hours and 7 minutes.  His residual AHI is 0.3 on 9 cm of water with EPR 3.  He does not have a significant leak.  He denies any significant changes with his memory.  He does report that his wife passed away in 12-20-22.  His daughter is with him today.  She states that she has noticed him be a little more forgetful but does not think he is unsafe to live by himself or drive.  She is unsure if the forgetfulness may be due to grief.  He is able to complete all ADLs independently.  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  He returns today for an evaluation.  REVIEW OF SYSTEMS: Out of a complete 14 system review of symptoms, the patient complains only of the following symptoms, and all other reviewed systems are  negative.   ALLERGIES: Allergies  Allergen Reactions  . Acyclovir And Related   . Cinnamon     Cinnamon toothpaste caused him to break out inside his mouth and lesions were removed  . Flexeril [Cyclobenzaprine Hcl]     Possible heart side effects  . Imodium [Loperamide Hcl]     unknown  . Levofloxacin Other (See Comments)    Elevated heart rate  . Nabumetone     REACTION: Heart issues/hospital admission  . Naproxen     Effects blood pressure and heart  . Nsaids     Effects blood pressure and heart  . Vantin     Pseudo colitis  . Vantin [Cefpodoxime]   . Vioxx [Rofecoxib]     Effects blood pressure and heart    HOME MEDICATIONS: Outpatient Medications Prior to Visit  Medication Sig Dispense Refill  . aspirin 81 MG tablet Take 81 mg by mouth daily.    Marland Kitchen ezetimibe (ZETIA) 10 MG tablet TAKE 1 TABLET BY MOUTH  DAILY 90 tablet 3  . famotidine (PEPCID) 20 MG tablet Take 20 mg by mouth 2 (two) times daily.    Marland Kitchen losartan (COZAAR) 25 MG tablet TAKE 1 TABLET BY MOUTH  DAILY 90 tablet 3  . Multiple Vitamin (MULTIVITAMIN) tablet Take 1 tablet by mouth  daily.      . Multiple Vitamins-Minerals (PRESERVISION AREDS 2 PO) Take 1 tablet by mouth 2 (two) times daily.    Marland Kitchen MYRBETRIQ 50 MG TB24 tablet Take 50 mg by mouth daily.    . rosuvastatin (CRESTOR) 5 MG tablet Take 1/2 tablet by mouth three times a week. Please keep upcoming appt for refills. Thank you 20 tablet 0  . sertraline (ZOLOFT) 25 MG tablet TAKE 1 TABLET BY MOUTH  DAILY 90 tablet 3   No facility-administered medications prior to visit.    PAST MEDICAL HISTORY: Past Medical History:  Diagnosis Date  . Abdominal pain   . Abnormal stress test 09/15/10   SEPTAL DEFECT SECONDARY TO PREVIOUS INFARCT VS LBBB; MILD SEPTAL ISCHEMIA  . Acid reflux   . CAD (coronary artery disease)    cath in 2007 reveals mild diffuse CAD  . Colon polyps   . Complete heart block [facility]) May 2011   has PTVP in place  . Diverticulosis   . Glucose  intolerance (impaired glucose tolerance)   . Hemorrhoids   . Hiatal hernia   . History of colonoscopy   . HTN (hypertension)   . Hypercholesterolemia   . Memory impairment    better with lower dose statin  . Pacemaker   . PVC's (premature ventricular contractions)   . Sinusitis   . Sleep apnea   . Ventral hernia     PAST SURGICAL HISTORY: Past Surgical History:  Procedure Laterality Date  . APPENDECTOMY    . CARDIAC CATHETERIZATION  07/29/2005   EF 50-55%; Has mild diffuse CAD  . cataract surgery  06/2009, 10/2014  . COLONOSCOPY    . CPAP    . LBBB    . NOSE SURGERY    . PACEMAKER INSERTION  11/18/09   MDT implanted by Dr Doreatha Lew  . REFRACTIVE SURGERY Bilateral   . right nephrectomy  01/2006   partial for mass    FAMILY HISTORY: Family History  Problem Relation Age of Onset  . Heart failure Father   . Leukemia Mother     SOCIAL HISTORY: Social History   Socioeconomic History  . Marital status: Married    Spouse name: [name]  . Number of children: 4  . Years of education: 53  . Highest education level: Not on file  Occupational History  . Occupation: Retired    Comment: [employer]  Tobacco Use  . Smoking status: Former Smoker    Quit date: 07/06/1983    Years since quitting: 36.2  . Smokeless tobacco: Never Used  . Tobacco comment: stopped smoking in 1985  Substance and Sexual Activity  . Alcohol use: No  . Drug use: No  . Sexual activity: Not Currently  Other Topics Concern  . Not on file  Social History Narrative   Retired from Surveyor, quantity.   Lives at home with wife   Caffeine use- morning coffee and 2 cup/ 1 cup of tea at lunch    Social Determinants of Health   Financial Resource Strain:   . Difficulty of Paying Living Expenses:   Food Insecurity:   . Worried About Charity fundraiser in the Last Year:   . Arboriculturist in the Last Year:   Transportation Needs:   . Film/video editor (Medical):   Marland Kitchen Lack of Transportation  (Non-Medical):   Physical Activity:   . Days of Exercise per Week:   . Minutes of Exercise per Session:   Stress:   .  Feeling of Stress :   Social Connections:   . Frequency of Communication with Friends and Family:   . Frequency of Social Gatherings with Friends and Family:   . Attends Religious Services:   . Active Member of Clubs or Organizations:   . Attends Archivist Meetings:   Marland Kitchen Marital Status:   Intimate Partner Violence:   . Fear of Current or Ex-Partner:   . Emotionally Abused:   Marland Kitchen Physically Abused:   . Sexually Abused:       PHYSICAL EXAM  Vitals:   10/01/19 1027  BP: 128/68  Pulse: 85  Temp: 97.7 F (36.5 C)  Weight: 178 lb (80.7 kg)  Height: 5\' 9"  (1.753 m)   Body mass index is 26.29 kg/m.   MMSE - Mini Mental State Exam 10/01/2019 04/03/2019 11/23/2017  Orientation to time 5 4 5   Orientation to Place 4 5 5   Registration 3 3 3   Attention/ Calculation 4 4 4   Recall 0 1 2  Language- name 2 objects 2 2 2   Language- repeat 1 1 1   Language- follow 3 step command 3 3 3   Language- read & follow direction 1 1 1   Write a sentence 1 1 1   Copy design 1 1 1   Total score 25 26 28      Generalized: Well developed, in no acute distress  Chest: Lungs clear to auscultation bilaterally  Neurological examination  Mentation: Alert oriented to time, place, history taking. Follows all commands speech and language fluent Cranial nerve II-XII: Extraocular movements were full, visual field were full on confrontational test Head turning and shoulder shrug  were normal and symmetric. Motor: The motor testing reveals 5 over 5 strength of all 4 extremities. Good symmetric motor tone is noted throughout.  Sensory: Sensory testing is intact to soft touch on all 4 extremities. No evidence of extinction is noted.  Gait and station: Gait is normal.    DIAGNOSTIC DATA (LABS, IMAGING, TESTING) - I reviewed patient records, labs, notes, testing and imaging myself where  available.  Lab Results  Component Value Date   WBC 4.4 03/19/2019   HGB 13.3 03/19/2019   HCT 40.3 03/19/2019   MCV 96.9 03/19/2019   PLT 204 03/19/2019      Component Value Date/Time   NA 141 03/19/2019 1040   NA 143 02/15/2018 1125   K 4.4 03/19/2019 1040   CL 109 03/19/2019 1040   CO2 25 03/19/2019 1040   GLUCOSE 96 03/19/2019 1040   BUN 23 03/19/2019 1040   BUN 22 02/15/2018 1125   CREATININE 0.93 03/19/2019 1040   CALCIUM 9.9 03/19/2019 1040   PROT 6.2 03/19/2019 1040   PROT 6.3 02/26/2019 1450   ALBUMIN 4.4 02/26/2019 1450   AST 18 03/19/2019 1040   ALT 14 03/19/2019 1040   ALKPHOS 57 02/26/2019 1450   BILITOT 0.6 03/19/2019 1040   BILITOT 0.2 02/26/2019 1450   GFRNONAA 78 03/19/2019 1040   GFRAA 90 03/19/2019 1040   Lab Results  Component Value Date   CHOL 132 03/19/2019   HDL 50 03/19/2019   LDLCALC 66 03/19/2019   TRIG 79 03/19/2019   CHOLHDL 2.6 03/19/2019   Lab Results  Component Value Date   HGBA1C 5.4 03/26/2019   Lab Results  Component Value Date   VITAMINB12 690 03/17/2017   Lab Results  Component Value Date   TSH 1.53 12/11/2018      ASSESSMENT AND PLAN 79 y.o. year old male  has a  past medical history of Abdominal pain, Abnormal stress test (09/15/10), Acid reflux, CAD (coronary artery disease), Colon polyps, Complete heart block [facility]) (May 2011), Diverticulosis, Glucose intolerance (impaired glucose tolerance), Hemorrhoids, Hiatal hernia, History of colonoscopy, HTN (hypertension), Hypercholesterolemia, Memory impairment, Pacemaker, PVC's (premature ventricular contractions), Sinusitis, Sleep apnea, and Ventral hernia. here with:  1. OSA on CPAP  - CPAP compliance excellent - Good treatment of AHI  - Encourage patient to use CPAP nightly and > 4 hours each night  2.  Memory disturbance  -Memory score is stable MMSE 25 out of 30 -I will check blood work today to rule out any underlying cause of memory issues -Continue to monitor  memory for now may consider medication if score decreases  Follow-up in 6 months or sooner if needed   I spent 30 minutes of face-to-face and non-face-to-face time with patient.  This included previsit chart review, lab review, study review, order entry, electronic health record documentation, patient education.  Ward Givens, MSN, NP-C 10/01/2019, 10:18 AM [facility] [address] [place], [place] [address] [phone]  I reviewed the above note and documentation by the Nurse Practitioner and agree with the history, exam, assessment and plan as outlined above. I was available for consultation. Star Age, MD, PhD [facility])

## 2019-10-01 NOTE — Patient Instructions (Addendum)
Continue using CPAP nightly and greater than 4 hours each night Memory score 25/30 Blood work today  If your symptoms worsen or you develop new symptoms please let us know.

## 2019-10-02 LAB — COMPREHENSIVE METABOLIC PANEL
ALT: 13 IU/L (ref 0–44)
AST: 18 IU/L (ref 0–40)
Albumin/Globulin Ratio: 2.1 (ref 1.2–2.2)
Albumin: 4.2 g/dL (ref 3.7–4.7)
Alkaline Phosphatase: 57 IU/L (ref 39–117)
BUN/Creatinine Ratio: 18 (ref 10–24)
BUN: 19 mg/dL (ref 8–27)
Bilirubin Total: 0.3 mg/dL (ref 0.0–1.2)
CO2: 22 mmol/L (ref 20–29)
Calcium: 10.1 mg/dL (ref 8.6–10.2)
Chloride: 106 mmol/L (ref 96–106)
Creatinine, Ser: 1.07 mg/dL (ref 0.76–1.27)
GFR calc Af Amer: 75 mL/min/{1.73_m2} (ref >59)
GFR calc non Af Amer: 65 mL/min/{1.73_m2} (ref >59)
Globulin, Total: 2 g/dL (ref 1.5–4.5)
Glucose: 94 mg/dL (ref 65–99)
Potassium: 4.8 mmol/L (ref 3.5–5.2)
Sodium: 142 mmol/L (ref 134–144)
Total Protein: 6.2 g/dL (ref 6.0–8.5)

## 2019-10-02 LAB — CBC WITH DIFFERENTIAL/PLATELET
Basophils Absolute: 0.1 10*3/uL (ref 0.0–0.2)
Basos: 1 %
EOS (ABSOLUTE): 0.2 10*3/uL (ref 0.0–0.4)
Eos: 3 %
Hematocrit: 40.6 % (ref 37.5–51.0)
Hemoglobin: 13.6 g/dL (ref 13.0–17.7)
Immature Grans (Abs): 0 10*3/uL (ref 0.0–0.1)
Immature Granulocytes: 0 %
Lymphocytes Absolute: 0.9 10*3/uL (ref 0.7–3.1)
Lymphs: 19 %
MCH: 32.2 pg (ref 26.6–33.0)
MCHC: 33.5 g/dL (ref 31.5–35.7)
MCV: 96 fL (ref 79–97)
Monocytes Absolute: 0.5 10*3/uL (ref 0.1–0.9)
Monocytes: 9 %
Neutrophils Absolute: 3.4 10*3/uL (ref 1.4–7.0)
Neutrophils: 68 %
Platelets: 213 10*3/uL (ref 150–450)
RBC: 4.23 x10E6/uL (ref 4.14–5.80)
RDW: 13 % (ref 11.6–15.4)
WBC: 5 10*3/uL (ref 3.4–10.8)

## 2019-10-02 LAB — VITAMIN B12: Vitamin B-12: 853 pg/mL (ref 232–1245)

## 2019-10-03 ENCOUNTER — Telehealth: Payer: Self-pay | Admitting: *Deleted

## 2019-10-03 NOTE — Telephone Encounter (Signed)
-----   Message from [place] Givens, NP sent at 10/02/2019  9:35 AM EDT ----- Labs results are unremarkable. Please call patient with results.

## 2019-10-03 NOTE — Telephone Encounter (Signed)
Left patient a message letting him know the labs were unremarkable. Provided our number to call back with any questions.

## 2019-10-09 DIAGNOSIS — H353122 Nonexudative age-related macular degeneration, left eye, intermediate dry stage: Secondary | ICD-10-CM | POA: Diagnosis not present

## 2019-10-09 DIAGNOSIS — H35363 Drusen (degenerative) of macula, bilateral: Secondary | ICD-10-CM | POA: Diagnosis not present

## 2019-10-09 DIAGNOSIS — H25813 Combined forms of age-related cataract, bilateral: Secondary | ICD-10-CM | POA: Diagnosis not present

## 2019-10-09 DIAGNOSIS — H35453 Secondary pigmentary degeneration, bilateral: Secondary | ICD-10-CM | POA: Diagnosis not present

## 2019-10-09 DIAGNOSIS — H353111 Nonexudative age-related macular degeneration, right eye, early dry stage: Secondary | ICD-10-CM | POA: Diagnosis not present

## 2019-10-31 ENCOUNTER — Other Ambulatory Visit: Payer: Self-pay | Admitting: Internal Medicine

## 2019-11-14 ENCOUNTER — Ambulatory Visit (INDEPENDENT_AMBULATORY_CARE_PROVIDER_SITE_OTHER): Payer: Medicare Other | Admitting: *Deleted

## 2019-11-14 DIAGNOSIS — I442 Atrioventricular block, complete: Secondary | ICD-10-CM | POA: Diagnosis not present

## 2019-11-14 LAB — CUP PACEART REMOTE DEVICE CHECK
Battery Impedance: 1064 Ohm
Battery Remaining Longevity: 58 mo
Battery Voltage: 2.78 V
Brady Statistic AP VP Percent: 8 %
Brady Statistic AP VS Percent: 0 %
Brady Statistic AS VP Percent: 92 %
Brady Statistic AS VS Percent: 0 %
Date Time Interrogation Session: 20210512084755
Implantable Lead Implant Date: 20110517
Implantable Lead Implant Date: 20110517
Implantable Lead Location: 753859
Implantable Lead Location: 753860
Implantable Lead Model: 4469
Implantable Lead Model: 4470
Implantable Lead Serial Number: 543103
Implantable Lead Serial Number: 672341
Implantable Pulse Generator Implant Date: 20110517
Lead Channel Impedance Value: 619 Ohm
Lead Channel Impedance Value: 643 Ohm
Lead Channel Pacing Threshold Amplitude: 0.375 V
Lead Channel Pacing Threshold Amplitude: 0.5 V
Lead Channel Pacing Threshold Pulse Width: 0.4 ms
Lead Channel Pacing Threshold Pulse Width: 0.4 ms
Lead Channel Setting Pacing Amplitude: 2 V
Lead Channel Setting Pacing Amplitude: 2.5 V
Lead Channel Setting Pacing Pulse Width: 0.4 ms
Lead Channel Setting Sensing Sensitivity: 2 mV

## 2019-11-15 NOTE — Progress Notes (Signed)
Remote pacemaker transmission.   

## 2020-01-04 DIAGNOSIS — H35453 Secondary pigmentary degeneration, bilateral: Secondary | ICD-10-CM | POA: Diagnosis not present

## 2020-01-04 DIAGNOSIS — H35363 Drusen (degenerative) of macula, bilateral: Secondary | ICD-10-CM | POA: Diagnosis not present

## 2020-01-04 DIAGNOSIS — H5315 Visual distortions of shape and size: Secondary | ICD-10-CM | POA: Diagnosis not present

## 2020-01-04 DIAGNOSIS — H353132 Nonexudative age-related macular degeneration, bilateral, intermediate dry stage: Secondary | ICD-10-CM | POA: Diagnosis not present

## 2020-01-21 ENCOUNTER — Other Ambulatory Visit: Payer: Self-pay | Admitting: Internal Medicine

## 2020-02-11 NOTE — Progress Notes (Signed)
CARDIOLOGY OFFICE NOTE  Date:  02/18/2020    Robert Ramsey Date of Birth: Aug 27, 1940 Medical Record #[id number]  PCP:  Elby Showers, MD  Cardiologist:  [place]   Chief Complaint  Patient presents with  . Follow-up    Seen for Dr. Rayann Heman    History of Present Illness: Robert Ramsey is a 79 y.o. male who presents today for a follow up visit. Seen for Dr. Rayann Heman. He is a former patient of Dr. Susa Simmonds.   He has mild CAD, HTN, HLD and pacemaker in place for Mobitz II AV block. Tolerates only low dose statin due to memory issues. He has had remote cath from 2007 showing mild diffuse CAD. He has had prior right partial nephrectomy for a renal mass by Dr. Karsten Ro back in 2012. He has had some progressive memory issues- No longer on beta blocker.  Last seen by Dr. Rayann Heman in February.I saw him last Mar 28, 2023. His wife died in 12-26-18 from lung cancer. Cardiac status felt to be ok.   Comes in today. Here alone.  He feels like he is doing well. No chest pain. Breathing is stable. He is trying to walk some. Says his daughters are happy with how he is doing. Tolerating his medicines. Seeing PCP soon with labs. Recent remote check of his pacemaker was stable - has 54 months left on his battery. He has no real concerns. He has been vaccinated against COVID 19.   Past Medical History:  Diagnosis Date  . Abdominal pain   . Abnormal stress test 09/15/10   SEPTAL DEFECT SECONDARY TO PREVIOUS INFARCT VS LBBB; MILD SEPTAL ISCHEMIA  . Acid reflux   . CAD (coronary artery disease)    cath in 2007 reveals mild diffuse CAD  . Colon polyps   . Complete heart block [facility]) 2009/12/25   has PTVP in place  . Diverticulosis   . Glucose intolerance (impaired glucose tolerance)   . Hemorrhoids   . Hiatal hernia   . History of colonoscopy   . HTN (hypertension)   . Hypercholesterolemia   . Memory impairment    better with lower dose statin  . Pacemaker   . PVC's (premature  ventricular contractions)   . Sinusitis   . Sleep apnea   . Ventral hernia     Past Surgical History:  Procedure Laterality Date  . APPENDECTOMY    . CARDIAC CATHETERIZATION  07/29/2005   EF 50-55%; Has mild diffuse CAD  . cataract surgery  06/2009, 10/2014  . COLONOSCOPY    . CPAP    . LBBB    . NOSE SURGERY    . PACEMAKER INSERTION  11/18/09   MDT implanted by Dr Doreatha Lew  . REFRACTIVE SURGERY Bilateral   . right nephrectomy  01/2006   partial for mass     Medications: Current Meds  Medication Sig  . aspirin 81 MG tablet Take 81 mg by mouth daily.  Marland Kitchen ezetimibe (ZETIA) 10 MG tablet TAKE 1 TABLET BY MOUTH  DAILY  . famotidine (PEPCID) 20 MG tablet Take 20 mg by mouth 2 (two) times daily.  Marland Kitchen losartan (COZAAR) 25 MG tablet TAKE 1 TABLET BY MOUTH  DAILY  . Multiple Vitamin (MULTIVITAMIN) tablet Take 1 tablet by mouth daily.    . Multiple Vitamins-Minerals (PRESERVISION AREDS 2 PO) Take 1 tablet by mouth 2 (two) times daily.  Marland Kitchen MYRBETRIQ 50 MG TB24 tablet Take 50 mg by mouth daily.  Marland Kitchen  rosuvastatin (CRESTOR) 5 MG tablet TAKE ONE-HALF TABLET BY  MOUTH 3 TIMES WEEKLY  . sertraline (ZOLOFT) 25 MG tablet TAKE 1 TABLET BY MOUTH  DAILY     Allergies: Allergies  Allergen Reactions  . Acyclovir And Related   . Cinnamon     Cinnamon toothpaste caused him to break out inside his mouth and lesions were removed  . Flexeril [Cyclobenzaprine Hcl]     Possible heart side effects  . Imodium [Loperamide Hcl]     unknown  . Levofloxacin Other (See Comments)    Elevated heart rate  . Nabumetone     REACTION: Heart issues/hospital admission  . Naproxen     Effects blood pressure and heart  . Nsaids     Effects blood pressure and heart  . Vantin     Pseudo colitis  . Vantin [Cefpodoxime]   . Vioxx [Rofecoxib]     Effects blood pressure and heart    Social History: The patient  reports that he quit smoking about 36 years ago. He has never used smokeless tobacco. He reports that he  does not drink alcohol and does not use drugs.   Family History: The patient's family history includes Heart failure in his father; Leukemia in his mother.   Review of Systems: Please see the history of present illness.   All other systems are reviewed and negative.   Physical Exam: VS:  BP 132/72   Pulse 89   Ht 5\' 9"  (1.753 m)   Wt 182 lb (82.6 kg)   SpO2 95%   BMI 26.88 kg/m  .  BMI Body mass index is 26.88 kg/m.  Wt Readings from Last 3 Encounters:  02/18/20 182 lb (82.6 kg)  10/01/19 178 lb (80.7 kg)  08/27/19 175 lb (79.4 kg)    General: Alert. He is in no acute distress.  Weight is up a few pounds.  Cardiac: Regular rate and rhythm. No murmurs, rubs, or gallops. No edema.  Respiratory:  Lungs are clear to auscultation bilaterally with normal work of breathing.  GI: Soft and nontender.  MS: No deformity or atrophy. Gait and ROM intact.  Skin: Warm and dry. Color is normal.  Neuro:  Strength and sensation are intact and no gross focal deficits noted.  Psych: Alert, appropriate and with normal affect.   LABORATORY DATA:  EKG:  EKG is ordered today. Personally reviewed by me. He is paced with underlying NSR.   Lab Results  Component Value Date   WBC 5.0 10/01/2019   HGB 13.6 10/01/2019   HCT 40.6 10/01/2019   PLT 213 10/01/2019   GLUCOSE 94 10/01/2019   CHOL 132 03/19/2019   TRIG 79 03/19/2019   HDL 50 03/19/2019   LDLCALC 66 03/19/2019   ALT 13 10/01/2019   AST 18 10/01/2019   NA 142 10/01/2019   K 4.8 10/01/2019   CL 106 10/01/2019   CREATININE 1.07 10/01/2019   BUN 19 10/01/2019   CO2 22 10/01/2019   TSH 1.53 12/11/2018   PSA 0.7 03/19/2019   HGBA1C 5.4 03/26/2019     BNP (last 3 results) No results for input(s): BNP in the last 8760 hours.  ProBNP (last 3 results) No results for input(s): PROBNP in the last 8760 hours.   Other Studies Reviewed Today:   ASSESSMENT & PLAN:    1. HTN - BP looks fine on his current regimen. No changes ade  today. Continue Losartan.   2. CAD - from remote cath - no  worrisome symptoms - remains on statin and aspirin.   3. HLD - on Crestor and Zetia - seeing PCP soon with upcoming labs.   4. Underlying PPM for CHB - followed by EP.   5. OSA - on CPAP  Current medicines are reviewed with the patient today.  The patient does not have concerns regarding medicines other than what has been noted above.  The following changes have been made:  See above.  Labs/ tests ordered today include:    Orders Placed This Encounter  Procedures  . EKG 12-Lead     Disposition:   FU with Dr. Rayann Heman in 6 months.    Patient is agreeable to this plan and will call if any problems develop in the interim.   SignedTruitt Merle, NP  02/18/2020 2:59 PM  [facility] [address] [place],   [address] Phone: [phone] Fax: [phone]

## 2020-02-13 ENCOUNTER — Ambulatory Visit (INDEPENDENT_AMBULATORY_CARE_PROVIDER_SITE_OTHER): Payer: Medicare Other | Admitting: *Deleted

## 2020-02-13 DIAGNOSIS — I442 Atrioventricular block, complete: Secondary | ICD-10-CM | POA: Diagnosis not present

## 2020-02-13 LAB — CUP PACEART REMOTE DEVICE CHECK
Battery Impedance: 1195 Ohm
Battery Remaining Longevity: 54 mo
Battery Voltage: 2.77 V
Brady Statistic AP VP Percent: 8 %
Brady Statistic AP VS Percent: 0 %
Brady Statistic AS VP Percent: 92 %
Brady Statistic AS VS Percent: 0 %
Date Time Interrogation Session: 20210811065949
Implantable Lead Implant Date: 20110517
Implantable Lead Implant Date: 20110517
Implantable Lead Location: 753859
Implantable Lead Location: 753860
Implantable Lead Model: 4469
Implantable Lead Model: 4470
Implantable Lead Serial Number: 543103
Implantable Lead Serial Number: 672341
Implantable Pulse Generator Implant Date: 20110517
Lead Channel Impedance Value: 640 Ohm
Lead Channel Impedance Value: 671 Ohm
Lead Channel Pacing Threshold Amplitude: 0.375 V
Lead Channel Pacing Threshold Amplitude: 0.5 V
Lead Channel Pacing Threshold Pulse Width: 0.4 ms
Lead Channel Pacing Threshold Pulse Width: 0.4 ms
Lead Channel Setting Pacing Amplitude: 2 V
Lead Channel Setting Pacing Amplitude: 2.5 V
Lead Channel Setting Pacing Pulse Width: 0.4 ms
Lead Channel Setting Sensing Sensitivity: 2 mV

## 2020-02-15 NOTE — Progress Notes (Signed)
Remote pacemaker transmission.   

## 2020-02-18 ENCOUNTER — Encounter: Payer: Self-pay | Admitting: Nurse Practitioner

## 2020-02-18 ENCOUNTER — Ambulatory Visit (INDEPENDENT_AMBULATORY_CARE_PROVIDER_SITE_OTHER): Payer: Medicare Other | Admitting: Nurse Practitioner

## 2020-02-18 ENCOUNTER — Other Ambulatory Visit: Payer: Self-pay

## 2020-02-18 VITALS — BP 132/72 | HR 89 | Ht 69.0 in | Wt 182.0 lb

## 2020-02-18 DIAGNOSIS — I2510 Atherosclerotic heart disease of native coronary artery without angina pectoris: Secondary | ICD-10-CM

## 2020-02-18 DIAGNOSIS — Z950 Presence of cardiac pacemaker: Secondary | ICD-10-CM

## 2020-02-18 DIAGNOSIS — I10 Essential (primary) hypertension: Secondary | ICD-10-CM | POA: Diagnosis not present

## 2020-02-18 DIAGNOSIS — E7849 Other hyperlipidemia: Secondary | ICD-10-CM | POA: Diagnosis not present

## 2020-02-18 NOTE — Patient Instructions (Addendum)
After Visit Summary:  We will be checking the following labs today - NONE   Medication Instructions:    Continue with your current medicines.    If you need a refill on your cardiac medications before your next appointment, please call your pharmacy.     Testing/Procedures To Be Arranged:  N/A  Follow-Up:   See Dr. Rayann Heman in 6 months    At [facility], you and your health needs are our priority.  As part of our continuing mission to provide you with exceptional heart care, we have created designated Provider Care Teams.  These Care Teams include your primary Cardiologist (physician) and Advanced Practice Providers (APPs -  Physician Assistants and Nurse Practitioners) who all work together to provide you with the care you need, when you need it.  Special Instructions:  . Stay safe, wash your hands for at least 20 seconds and wear a mask when needed.  . It was good to talk with you today.    Call the [facility] office at [phone] if you have any questions, problems or concerns.

## 2020-03-11 DIAGNOSIS — R972 Elevated prostate specific antigen [PSA]: Secondary | ICD-10-CM | POA: Diagnosis not present

## 2020-03-17 DIAGNOSIS — R350 Frequency of micturition: Secondary | ICD-10-CM | POA: Diagnosis not present

## 2020-03-17 DIAGNOSIS — N401 Enlarged prostate with lower urinary tract symptoms: Secondary | ICD-10-CM | POA: Diagnosis not present

## 2020-03-17 DIAGNOSIS — R358 Other polyuria: Secondary | ICD-10-CM | POA: Diagnosis not present

## 2020-03-25 ENCOUNTER — Ambulatory Visit: Payer: Self-pay

## 2020-03-25 ENCOUNTER — Ambulatory Visit (INDEPENDENT_AMBULATORY_CARE_PROVIDER_SITE_OTHER): Payer: Medicare Other | Admitting: Orthopedic Surgery

## 2020-03-25 ENCOUNTER — Encounter: Payer: Self-pay | Admitting: Physician Assistant

## 2020-03-25 VITALS — Ht 69.0 in | Wt 182.0 lb

## 2020-03-25 DIAGNOSIS — I2510 Atherosclerotic heart disease of native coronary artery without angina pectoris: Secondary | ICD-10-CM | POA: Diagnosis not present

## 2020-03-25 DIAGNOSIS — M25511 Pain in right shoulder: Secondary | ICD-10-CM | POA: Diagnosis not present

## 2020-03-25 MED ORDER — LIDOCAINE HCL 1 % IJ SOLN
5.0000 mL | INTRAMUSCULAR | Status: AC | PRN
  Administered 2020-03-25: 5 mL

## 2020-03-25 MED ORDER — METHYLPREDNISOLONE ACETATE 40 MG/ML IJ SUSP
40.0000 mg | INTRAMUSCULAR | Status: AC | PRN
  Administered 2020-03-25: 40 mg via INTRA_ARTICULAR

## 2020-03-25 NOTE — Progress Notes (Signed)
Office Visit Note   Patient: Robert Ramsey           Date of Birth: [birth year]           MRN: [id number] Visit Date: 03/25/2020              Requested by: Elby Showers, MD [address] [place],  [place] [address] PCP: Elby Showers, MD  Chief Complaint  Patient presents with  . Right Shoulder - Pain      HPI: Patient is a 79 year old gentleman who presents complaining of lateral right shoulder pain pain radiates down the biceps tendon.  He states the pain was worse after cleaning windows.  He states he has decreased range of motion and has difficulty getting his arm overhead.  Assessment & Plan: Visit Diagnoses:  1. Acute pain of right shoulder     Plan: Right shoulder was injected in the subacromial space follow-up in 4 weeks.  May need to consider an MRI scan.  Follow-Up Instructions: Return in about 4 weeks (around 04/22/2020).   Ortho Exam  Patient is alert, oriented, no adenopathy, well-dressed, normal affect, normal respiratory effort. Examination patient has abduction flexion actively to 70 degrees passively to 90 degrees.  He has pain with Neer and Hawkins impingement test pain with a drop arm test he has pain to palpation over the biceps tendon AC joint is nontender to palpation.  Imaging: XR Shoulder Right  Result Date: 03/25/2020 2 view radiographs of the right shoulder shows a congruent glenohumeral joint there is superior migration of the humeral head within the glenoid.  No images are attached to the encounter.  Labs: Lab Results  Component Value Date   HGBA1C 5.4 03/26/2019   HGBA1C 5.3 12/22/2018   HGBA1C 5.4 06/19/2018   ESRSEDRATE 12 11/06/2015   ESRSEDRATE 42 (H) 08/17/2013   LABURIC 6.7 11/06/2015   LABURIC 6.6 08/17/2013     Lab Results  Component Value Date   ALBUMIN 4.2 10/01/2019   ALBUMIN 4.4 02/26/2019   ALBUMIN 4.4 02/15/2018   PREALBUMIN 22 03/26/2019   PREALBUMIN 20 (L) 01/15/2019   PREALBUMIN 19 (L) 12/22/2018     LABURIC 6.7 11/06/2015   LABURIC 6.6 08/17/2013    No results found for: MG No results found for: VD25OH  Lab Results  Component Value Date   PREALBUMIN 22 03/26/2019   PREALBUMIN 20 (L) 01/15/2019   PREALBUMIN 19 (L) 12/22/2018   CBC EXTENDED Latest Ref Rng & Units 10/01/2019 03/19/2019 12/11/2018  WBC 3.4 - 10.8 x10E3/uL 5.0 4.4 4.8  RBC 4.14 - 5.80 x10E6/uL 4.23 4.16(L) 4.09(L)  HGB 13.0 - 17.7 g/dL 13.6 13.3 13.1(L)  HCT 37.5 - 51.0 % 40.6 40.3 39.0  PLT 150 - 450 x10E3/uL 213 204 215  NEUTROABS 1 - 7 x10E3/uL 3.4 2,807 3,125  LYMPHSABS 0 - 3 x10E3/uL 0.9 1,038 1,056     Body mass index is 26.88 kg/m.  Orders:  Orders Placed This Encounter  Procedures  . XR Shoulder Right   No orders of the defined types were placed in this encounter.    Procedures: Large Joint Inj: R subacromial bursa on 03/25/2020 2:29 PM Indications: diagnostic evaluation and pain Details: 22 G 1.5 in needle, posterior approach  Arthrogram: No  Medications: 5 mL lidocaine 1 %; 40 mg methylPREDNISolone acetate 40 MG/ML Outcome: tolerated well, no immediate complications Procedure, treatment alternatives, risks and benefits explained, specific risks discussed. Consent was given by the patient. Immediately prior to procedure a  time out was called to verify the correct patient, procedure, equipment, support staff and site/side marked as required. Patient was prepped and draped in the usual sterile fashion.      Clinical Data: No additional findings.  ROS:  All other systems negative, except as noted in the HPI. Review of Systems  Objective: Vital Signs: Ht 5\' 9"  (1.753 m)   Wt 182 lb (82.6 kg)   BMI 26.88 kg/m   Specialty Comments:  No specialty comments available.  PMFS History: Patient Active Problem List   Diagnosis Date Noted  . Weight loss 12/30/2018  . Memory loss 01/15/2015  . Excessive daytime sleepiness 01/15/2015  . Hx of partial nephrectomy 09/20/2011  . CAD  (coronary artery disease) 02/17/2011  . Hyperlipidemia 02/17/2011  . Pacemaker-Medtronic 02/17/2011  . Complete heart block ([place]) 11/06/2010  . Hypertension 11/06/2010   Past Medical History:  Diagnosis Date  . Abdominal pain   . Abnormal stress test 09/15/10   SEPTAL DEFECT SECONDARY TO PREVIOUS INFARCT VS LBBB; MILD SEPTAL ISCHEMIA  . Acid reflux   . CAD (coronary artery disease)    cath in 2007 reveals mild diffuse CAD  . Colon polyps   . Complete heart block [facility]) May 2011   has PTVP in place  . Diverticulosis   . Glucose intolerance (impaired glucose tolerance)   . Hemorrhoids   . Hiatal hernia   . History of colonoscopy   . HTN (hypertension)   . Hypercholesterolemia   . Memory impairment    better with lower dose statin  . Pacemaker   . PVC's (premature ventricular contractions)   . Sinusitis   . Sleep apnea   . Ventral hernia     Family History  Problem Relation Age of Onset  . Heart failure Father   . Leukemia Mother     Past Surgical History:  Procedure Laterality Date  . APPENDECTOMY    . CARDIAC CATHETERIZATION  07/29/2005   EF 50-55%; Has mild diffuse CAD  . cataract surgery  06/2009, 10/2014  . COLONOSCOPY    . CPAP    . LBBB    . NOSE SURGERY    . PACEMAKER INSERTION  11/18/09   MDT implanted by Dr Doreatha Lew  . REFRACTIVE SURGERY Bilateral   . right nephrectomy  01/2006   partial for mass   Social History   Occupational History  . Occupation: Retired    Comment: [employer]  Tobacco Use  . Smoking status: Former Smoker    Quit date: 07/06/1983    Years since quitting: 36.7  . Smokeless tobacco: Never Used  . Tobacco comment: stopped smoking in 1985  Substance and Sexual Activity  . Alcohol use: No  . Drug use: No  . Sexual activity: Not Currently

## 2020-03-26 ENCOUNTER — Telehealth: Payer: Self-pay | Admitting: Orthopedic Surgery

## 2020-03-26 NOTE — Telephone Encounter (Signed)
Pt would like to know if he can continue with his hot and cold compressions for his arm? Would like a CB with answer  [phone]

## 2020-03-26 NOTE — Telephone Encounter (Signed)
I called and sw pt and advised he could do what feels best. Also the cortisone inject her received yesterday to give it about 7 days for him to feel the maximum benefit. If he continues to have pain and no relief at all  to call and let us know and may want to proceed with the MRI sooner if he is in continued discomfort.

## 2020-03-27 ENCOUNTER — Other Ambulatory Visit: Payer: Self-pay

## 2020-03-27 ENCOUNTER — Other Ambulatory Visit: Payer: Medicare Other | Admitting: Internal Medicine

## 2020-03-27 DIAGNOSIS — I10 Essential (primary) hypertension: Secondary | ICD-10-CM | POA: Diagnosis not present

## 2020-03-27 DIAGNOSIS — I2510 Atherosclerotic heart disease of native coronary artery without angina pectoris: Secondary | ICD-10-CM | POA: Diagnosis not present

## 2020-03-27 DIAGNOSIS — R413 Other amnesia: Secondary | ICD-10-CM | POA: Diagnosis not present

## 2020-03-27 DIAGNOSIS — Z950 Presence of cardiac pacemaker: Secondary | ICD-10-CM

## 2020-03-27 DIAGNOSIS — Z0000 Encounter for general adult medical examination without abnormal findings: Secondary | ICD-10-CM | POA: Diagnosis not present

## 2020-03-27 DIAGNOSIS — R7302 Impaired glucose tolerance (oral): Secondary | ICD-10-CM

## 2020-03-27 DIAGNOSIS — Z125 Encounter for screening for malignant neoplasm of prostate: Secondary | ICD-10-CM | POA: Diagnosis not present

## 2020-03-27 DIAGNOSIS — E7849 Other hyperlipidemia: Secondary | ICD-10-CM | POA: Diagnosis not present

## 2020-03-28 ENCOUNTER — Ambulatory Visit (INDEPENDENT_AMBULATORY_CARE_PROVIDER_SITE_OTHER): Payer: Medicare Other | Admitting: Internal Medicine

## 2020-03-28 ENCOUNTER — Encounter: Payer: Self-pay | Admitting: Internal Medicine

## 2020-03-28 VITALS — BP 150/80 | HR 82 | Ht 69.0 in | Wt 182.0 lb

## 2020-03-28 DIAGNOSIS — Z0000 Encounter for general adult medical examination without abnormal findings: Secondary | ICD-10-CM | POA: Diagnosis not present

## 2020-03-28 DIAGNOSIS — Z950 Presence of cardiac pacemaker: Secondary | ICD-10-CM

## 2020-03-28 DIAGNOSIS — H3530 Unspecified macular degeneration: Secondary | ICD-10-CM

## 2020-03-28 DIAGNOSIS — R413 Other amnesia: Secondary | ICD-10-CM

## 2020-03-28 DIAGNOSIS — I10 Essential (primary) hypertension: Secondary | ICD-10-CM

## 2020-03-28 DIAGNOSIS — G4730 Sleep apnea, unspecified: Secondary | ICD-10-CM

## 2020-03-28 DIAGNOSIS — K219 Gastro-esophageal reflux disease without esophagitis: Secondary | ICD-10-CM

## 2020-03-28 DIAGNOSIS — R3915 Urgency of urination: Secondary | ICD-10-CM

## 2020-03-28 DIAGNOSIS — E7849 Other hyperlipidemia: Secondary | ICD-10-CM

## 2020-03-28 DIAGNOSIS — Z23 Encounter for immunization: Secondary | ICD-10-CM

## 2020-03-28 DIAGNOSIS — R7302 Impaired glucose tolerance (oral): Secondary | ICD-10-CM

## 2020-03-28 DIAGNOSIS — Z905 Acquired absence of kidney: Secondary | ICD-10-CM

## 2020-03-28 DIAGNOSIS — F09 Unspecified mental disorder due to known physiological condition: Secondary | ICD-10-CM

## 2020-03-28 LAB — CBC WITH DIFFERENTIAL/PLATELET
Absolute Monocytes: 530 cells/uL (ref 200–950)
Basophils Absolute: 31 cells/uL (ref 0–200)
Basophils Relative: 0.4 %
Eosinophils Absolute: 8 cells/uL — ABNORMAL LOW (ref 15–500)
Eosinophils Relative: 0.1 %
HCT: 41.4 % (ref 38.5–50.0)
Hemoglobin: 13.9 g/dL (ref 13.2–17.1)
Lymphs Abs: 1576 cells/uL (ref 850–3900)
MCH: 32.4 pg (ref 27.0–33.0)
MCHC: 33.6 g/dL (ref 32.0–36.0)
MCV: 96.5 fL (ref 80.0–100.0)
MPV: 10.5 fL (ref 7.5–12.5)
Monocytes Relative: 6.8 %
Neutro Abs: 5655 cells/uL (ref 1500–7800)
Neutrophils Relative %: 72.5 %
Platelets: 261 10*3/uL (ref 140–400)
RBC: 4.29 10*6/uL (ref 4.20–5.80)
RDW: 13 % (ref 11.0–15.0)
Total Lymphocyte: 20.2 %
WBC: 7.8 10*3/uL (ref 3.8–10.8)

## 2020-03-28 LAB — COMPLETE METABOLIC PANEL WITH GFR
AG Ratio: 1.6 (calc) (ref 1.0–2.5)
ALT: 15 U/L (ref 9–46)
AST: 21 U/L (ref 10–35)
Albumin: 4.1 g/dL (ref 3.6–5.1)
Alkaline phosphatase (APISO): 47 U/L (ref 35–144)
BUN/Creatinine Ratio: 27 (calc) — ABNORMAL HIGH (ref 6–22)
BUN: 28 mg/dL — ABNORMAL HIGH (ref 7–25)
CO2: 25 mmol/L (ref 20–32)
Calcium: 10.7 mg/dL — ABNORMAL HIGH (ref 8.6–10.3)
Chloride: 105 mmol/L (ref 98–110)
Creat: 1.04 mg/dL (ref 0.70–1.11)
GFR, Est African American: 78 mL/min/{1.73_m2} (ref >=60)
GFR, Est Non African American: 67 mL/min/{1.73_m2} (ref >=60)
Globulin: 2.5 g/dL (calc) (ref 1.9–3.7)
Glucose, Bld: 94 mg/dL (ref 65–99)
Potassium: 4.6 mmol/L (ref 3.5–5.3)
Sodium: 139 mmol/L (ref 135–146)
Total Bilirubin: 0.4 mg/dL (ref 0.2–1.2)
Total Protein: 6.6 g/dL (ref 6.1–8.1)

## 2020-03-28 LAB — POCT URINALYSIS DIPSTICK
Appearance: NEGATIVE
Bilirubin, UA: NEGATIVE
Blood, UA: NEGATIVE
Glucose, UA: NEGATIVE
Ketones, UA: NEGATIVE
Leukocytes, UA: NEGATIVE
Nitrite, UA: NEGATIVE
Odor: NEGATIVE
Protein, UA: NEGATIVE
Spec Grav, UA: 1.01 (ref 1.010–1.025)
Urobilinogen, UA: 0.2 E.U./dL
pH, UA: 6.5 (ref 5.0–8.0)

## 2020-03-28 LAB — HEMOGLOBIN A1C
Hgb A1c MFr Bld: 5.5 % of total Hgb (ref <5.7)
Mean Plasma Glucose: 111 (calc)
eAG (mmol/L): 6.2 (calc)

## 2020-03-28 LAB — LIPID PANEL
Cholesterol: 140 mg/dL (ref <200)
HDL: 53 mg/dL (ref >=40)
LDL Cholesterol (Calc): 69 mg/dL (calc)
Non-HDL Cholesterol (Calc): 87 mg/dL (calc) (ref <130)
Total CHOL/HDL Ratio: 2.6 (calc) (ref <5.0)
Triglycerides: 93 mg/dL (ref <150)

## 2020-03-28 NOTE — Progress Notes (Signed)
Subjective:    Patient ID: Robert Ramsey, male    DOB: 21-Aug-1940, 79 y.o.   MRN: [id number]  HPI  79 year old Male for health maintenance exam and evaluation of medical issues.  Staying busy with working at [employer]. Goes out to breakfast with friends occasionally. 2 daughters and 2 stepdaughters are supportive.  Yellow jackets got after him while working outdoors and he overextended his right shoulder trying to fight them off. He had a couple of stings but no reaction. Has seen Dr. Sharol Given about his right shoulder. Had an injection.  Still with mild memory loss. Still drives. Has not gotten lost. Still does puzzles and works on his family history.  Received flu vaccine and 2 Covid-19 immunizations.  Social history: His wife passed away in October 12, 2018.  He resides alone but his family is supportive.  He has mild cognitive impairment but is doing okay at home.  History of partial right nephrectomy for renal mass by Dr. Karsten Ro in 12-Oct-2010.  History of pacemaker in place for Mobitz 2 AV block.  Poorly tolerates low-dose statin due to memory issues.  Had cardiac catheterization 10-11-2005 showing mild diffuse coronary artery disease.  He has a number of drug intolerances.  Most of these are intolerances rather than true allergies.  History of impaired glucose tolerance and GE reflux.  History of sleep apnea treated with CPAP.  History of appendectomy.  Had cataract surgery December 2010.  History of left bundle branch block.  He quit smoking in 1983/10/12.  He had neuropsychological testing by Dr. Valentina Shaggy in 2014/10/12 and was diagnosed with mild cognitive impairment.  Report indicated he had memory impairment for acquisition storage and retrieval.  Ability to solve novel problems was deficient primarily due to apparent difficulty maintaining conceptual ideas within working memory.  Amnestic type.  Has seen Dr. Jarrett Soho May 2019.  He was cautioned regarding driving but he continues to drive.  Recommended no  follow-up unless symptoms worsen.  History of nonexudative age-related macular degeneration followed by Dr. Kathrin Penner  Colonoscopy done in 10/12/2018 showed internal and external hemorrhoids.  Also had diverticulosis.  Study was done by Dr. Watt Climes.  1 colon polyp removed and transverse colon described as polypoid colonic mucosa negative for dysplasia.  Social history: He is a widower.  He continues to drive.  Has not gotten lost.  Former smoker.  Does not consume alcohol.  He prepares some foods at home.  His daughters bring him food.  He goes out to eat some.  Family history: Father deceased due to heart failure mother deceased due to leukemia.    Review of Systems some urinary urgency- saw Urologist recently Sept 13, 2021 and PSA was normal at 0.58. Dr. Karsten Ro retired and Dr. Milford Cage is seeing him now.     Objective:   Physical Exam  Blood pressure 150/80 but he is a bit anxious pulse 82 pulse oximetry 98% weight 192 pounds BMI 26.88 height 5 feet 9 inches  Skin warm and dry.  No cervical adenopathy.  No carotid bruits.  TMs are clear.  Neck is supple without JVD thyromegaly or carotid bruits.  Chest clear to auscultation.  Cardiac exam regular rate and rhythm normal S1 and S2 without murmurs or gallops.  Abdomen soft nondistended without hepatosplenomegaly masses or tenderness.  Prostate is normal without masses.  No lower extremity edema.  Neuro no gross focal deficits on brief neurological exam except for some mild memory issues with recall  Assessment & Plan:  Mild cognitive disorder-seems stable  History of pacemaker for Mobitz 2 AV block followed by cardiology  History of weight loss after his wife passed away but this is corrected  History of impaired glucose tolerance  GE reflux treated with Pepcid  Hypertension stable on low-dose Cozaar  History of mild diffuse coronary disease  Sleep apnea treated with CPAP  Hyperlipidemia treated with low-dose Crestor and  Zetia  Sees urology for history of partial nephrectomy in the remote past and takes Myrbetriq  History of macular degeneration followed by ophthalmology  Plan: Generally he comes once a year.  Family knows they can contact me if they have any concerns.  Subjective:   Patient presents for Medicare Annual/Subsequent preventive examination.  Review Past Medical/Family/Social: See above   Risk Factors  Current exercise habits: Physically active about his home Dietary issues discussed: Low-fat low carbohydrate  Cardiac risk factors: Hyperlipidemia, history of dysrhythmia requiring pacemaker Depression Screen  (Note: if answer to either of the following is "Yes", a more complete depression screening is indicated)   Over the past two weeks, have you felt down, depressed or hopeless? No  Over the past two weeks, have you felt little interest or pleasure in doing things? No Have you lost interest or pleasure in daily life? No Do you often feel hopeless? No Do you cry easily over simple problems? No   Activities of Daily Living  In your present state of health, do you have any difficulty performing the following activities?:   Driving? No  Managing money? No  Feeding yourself? No  Getting from bed to chair? No  Climbing a flight of stairs? No  Preparing food and eating?: No  Bathing or showering? No  Getting dressed: No  Getting to the toilet? No  Using the toilet:No  Moving around from place to place: No  In the past year have you fallen or had a near fall?:No  Are you sexually active? No  Do you have more than one partner? No   Hearing Difficulties: No  Do you often ask people to speak up or repeat themselves? No  Do you experience ringing or noises in your ears? No  Do you have difficulty understanding soft or whispered voices?  Sometimes Do you feel that you have a problem with memory?  Yes Do you often misplace items? No    Home Safety:  Do you have a smoke alarm at  your residence? Yes Do you have grab bars in the bathroom?  Yes Do you have throw rugs in your house?  Is   Cognitive Testing  Alert? Yes Normal Appearance?Yes  Oriented to person? Yes Place? Yes  Time? Yes  Recall of three objects?  Not tested Can perform simple calculations? Yes  Displays appropriate judgment?Yes  Can read the correct time from a watch face?Yes   List the Names of Other Physician/Practitioners you currently use:  See referral list for the physicians patient is currently seeing.  Cardiology   Review of Systems: See above  Objective:     General appearance: Appears younger than stated age Head: Normocephalic, without obvious abnormality, atraumatic  Eyes: conj clear, EOMi PEERLA  Ears: normal TM's and external ear canals both ears  Nose: Nares normal. Septum midline. Mucosa normal. No drainage or sinus tenderness.  Throat: lips, mucosa, and tongue normal; teeth and gums normal  Neck: no adenopathy, no carotid bruit, no JVD, supple, symmetrical, trachea midline and thyroid not enlarged, symmetric, no tenderness/mass/nodules  No CVA tenderness.  Lungs: clear to auscultation bilaterally  Breasts: normal appearance, no masses or tenderness Heart: regular rate and rhythm, S1, S2 normal, no murmur, click, rub or gallop  Abdomen: soft, non-tender; bowel sounds normal; no masses, no organomegaly  Musculoskeletal: ROM normal in all joints, no crepitus, no deformity, Normal muscle strengthen. Back  is symmetric, no curvature. Skin: Skin color, texture, turgor normal. No rashes or lesions  Lymph nodes: Cervical, supraclavicular, and axillary nodes normal.  Neurologic: CN 2 -12 Normal, Normal symmetric reflexes. Normal coordination and gait  Psych: Alert & Oriented x 3, Mood appear stable.    Assessment:    Annual wellness medicare exam   Plan:    During the course of the visit the patient was educated and counseled about appropriate screening and preventive  services including:        Patient Instructions (the written plan) was given to the patient.  Medicare Attestation  I have personally reviewed:  The patient's medical and social history  Their use of alcohol, tobacco or illicit drugs  Their current medications and supplements  The patient's functional ability including ADLs,fall risks, home safety risks, cognitive, and hearing and visual impairment  Diet and physical activities  Evidence for depression or mood disorders  The patient's weight, height, BMI, and visual acuity have been recorded in the chart. I have made referrals, counseling, and provided education to the patient based on review of the above and I have provided the patient with a written personalized care plan for preventive services.

## 2020-04-02 ENCOUNTER — Encounter: Payer: Self-pay | Admitting: Adult Health

## 2020-04-02 ENCOUNTER — Ambulatory Visit (INDEPENDENT_AMBULATORY_CARE_PROVIDER_SITE_OTHER): Payer: Medicare Other | Admitting: Adult Health

## 2020-04-02 VITALS — BP 133/74 | HR 74 | Ht 69.0 in | Wt 184.0 lb

## 2020-04-02 DIAGNOSIS — Z9989 Dependence on other enabling machines and devices: Secondary | ICD-10-CM

## 2020-04-02 DIAGNOSIS — I2510 Atherosclerotic heart disease of native coronary artery without angina pectoris: Secondary | ICD-10-CM

## 2020-04-02 DIAGNOSIS — R413 Other amnesia: Secondary | ICD-10-CM

## 2020-04-02 DIAGNOSIS — G4733 Obstructive sleep apnea (adult) (pediatric): Secondary | ICD-10-CM | POA: Diagnosis not present

## 2020-04-02 NOTE — Patient Instructions (Signed)
Your Plan:  Continue to monitor memory- score improved Continue CPAP If your symptoms worsen or you develop new symptoms please let us know.       Thank you for coming to see [place] at [facility]. I hope we have been able to provide you high quality care today.  You may receive a patient satisfaction survey over the next few weeks. We would appreciate your feedback and comments so that we may continue to improve ourselves and the health of our patients.

## 2020-04-02 NOTE — Progress Notes (Signed)
I reviewed note and agree with plan.   Penni Bombard, MD 8/94/8347, 58:30 AM Certified in Neurology, Neurophysiology and Neuroimaging  [facility] [address] [place], [place] [address] [phone]

## 2020-04-02 NOTE — Patient Instructions (Addendum)
Received flu vaccine today.  Continue current medications and follow-up in 1 year or as needed.  Have COVID-19 booster when available.

## 2020-04-02 NOTE — Progress Notes (Signed)
PATIENT: Robert Ramsey DOB: September 18, 1940  REASON FOR VISIT: follow up HISTORY FROM: patient  HISTORY OF PRESENT ILLNESS: Today 04/02/20:  Robert Ramsey is an 79 year old male with a history of memory disturbance and obstructive sleep apnea.  He returns today for follow-up on his memory.  He feels that overall things are stable.  He continues to live at home alone.  He does have a cat there.  He is able to complete all ADLs independently.  He completes all household chores independently.  His daughters provide his meals.  He denies any trouble sleeping.  Continues to use CPAP consistently.  Overall he feels that he is doing well.  HISTORY 10/01/19:  Robert Ramsey is an 79 year old male with a history of obstructive sleep apnea on CPAP.  His download indicates that he uses machine nightly for compliance of 100%.  He uses machine greater than 4 hours 29 days for compliance of 97%.  On average he uses his machine 8 hours and 15 minutes.  His residual AHI is 0.4 on 9 cm of water with EPR 3.  Leak in the 95th percentile is 13 L/min he reports that the CPAP is working well for him.    He feels that his memory has remained stable.  He currently lives at home alone.  He is able to complete all ADLs independently.  He operates a Teacher, music without difficulty.  He is able to manage his own finances.  His daughter reports that he is repetitive other than that he has been doing well.  He returns today for an evaluation.  REVIEW OF SYSTEMS: Out of a complete 14 system review of symptoms, the patient complains only of the following symptoms, and all other reviewed systems are negative.  See HPI  ALLERGIES: Allergies  Allergen Reactions  . Acyclovir And Related   . Cinnamon     Cinnamon toothpaste caused him to break out inside his mouth and lesions were removed  . Flexeril [Cyclobenzaprine Hcl]     Possible heart side effects  . Imodium [Loperamide Hcl]     unknown  . Levofloxacin Other (See  Comments)    Elevated heart rate  . Nabumetone     REACTION: Heart issues/hospital admission  . Naproxen     Effects blood pressure and heart  . Nsaids     Effects blood pressure and heart  . Vantin     Pseudo colitis  . Vantin [Cefpodoxime]   . Vioxx [Rofecoxib]     Effects blood pressure and heart    HOME MEDICATIONS: Outpatient Medications Prior to Visit  Medication Sig Dispense Refill  . aspirin 81 MG tablet Take 81 mg by mouth daily.    Marland Kitchen ezetimibe (ZETIA) 10 MG tablet TAKE 1 TABLET BY MOUTH  DAILY 90 tablet 3  . famotidine (PEPCID) 20 MG tablet Take 20 mg by mouth 2 (two) times daily.    Marland Kitchen losartan (COZAAR) 25 MG tablet TAKE 1 TABLET BY MOUTH  DAILY 90 tablet 3  . Multiple Vitamin (MULTIVITAMIN) tablet Take 1 tablet by mouth daily.      . Multiple Vitamins-Minerals (PRESERVISION AREDS 2 PO) Take 1 tablet by mouth 2 (two) times daily.    Marland Kitchen MYRBETRIQ 50 MG TB24 tablet Take 50 mg by mouth daily.    . rosuvastatin (CRESTOR) 5 MG tablet TAKE ONE-HALF TABLET BY  MOUTH 3 TIMES WEEKLY 20 tablet 3  . sertraline (ZOLOFT) 25 MG tablet TAKE 1 TABLET BY MOUTH  DAILY  90 tablet 3   No facility-administered medications prior to visit.    PAST MEDICAL HISTORY: Past Medical History:  Diagnosis Date  . Abdominal pain   . Abnormal stress test 09/15/10   SEPTAL DEFECT SECONDARY TO PREVIOUS INFARCT VS LBBB; MILD SEPTAL ISCHEMIA  . Acid reflux   . CAD (coronary artery disease)    cath in 2007 reveals mild diffuse CAD  . Colon polyps   . Complete heart block [facility]) May 2011   has PTVP in place  . Diverticulosis   . Glucose intolerance (impaired glucose tolerance)   . Hemorrhoids   . Hiatal hernia   . History of colonoscopy   . HTN (hypertension)   . Hypercholesterolemia   . Memory impairment    better with lower dose statin  . Pacemaker   . PVC's (premature ventricular contractions)   . Sinusitis   . Sleep apnea   . Ventral hernia     PAST SURGICAL HISTORY: Past Surgical  History:  Procedure Laterality Date  . APPENDECTOMY    . CARDIAC CATHETERIZATION  07/29/2005   EF 50-55%; Has mild diffuse CAD  . cataract surgery  06/2009, 10/2014  . COLONOSCOPY    . CPAP    . LBBB    . NOSE SURGERY    . PACEMAKER INSERTION  11/18/09   MDT implanted by Dr Doreatha Lew  . REFRACTIVE SURGERY Bilateral   . right nephrectomy  01/2006   partial for mass    FAMILY HISTORY: Family History  Problem Relation Age of Onset  . Heart failure Father   . Leukemia Mother     SOCIAL HISTORY: Social History   Socioeconomic History  . Marital status: Married    Spouse name: Robert Ramsey  . Number of children: 4  . Years of education: 19  . Highest education level: Not on file  Occupational History  . Occupation: Retired    Comment: [employer]  Tobacco Use  . Smoking status: Former Smoker    Quit date: 07/06/1983    Years since quitting: 36.7  . Smokeless tobacco: Never Used  . Tobacco comment: stopped smoking in 1985  Substance and Sexual Activity  . Alcohol use: No  . Drug use: No  . Sexual activity: Not Currently  Other Topics Concern  . Not on file  Social History Narrative   Retired from Surveyor, quantity.   Lives at home with wife   Caffeine use- morning coffee and 2 cup/ 1 cup of tea at lunch    Social Determinants of Health   Financial Resource Strain:   . Difficulty of Paying Living Expenses: Not on file  Food Insecurity:   . Worried About Charity fundraiser in the Last Year: Not on file  . Ran Out of Food in the Last Year: Not on file  Transportation Needs:   . Lack of Transportation (Medical): Not on file  . Lack of Transportation (Non-Medical): Not on file  Physical Activity:   . Days of Exercise per Week: Not on file  . Minutes of Exercise per Session: Not on file  Stress:   . Feeling of Stress : Not on file  Social Connections:   . Frequency of Communication with Friends and Family: Not on file  . Frequency of Social Gatherings with Friends and  Family: Not on file  . Attends Religious Services: Not on file  . Active Member of Clubs or Organizations: Not on file  . Attends Archivist Meetings: Not on  file  . Marital Status: Not on file  Intimate Partner Violence:   . Fear of Current or Ex-Partner: Not on file  . Emotionally Abused: Not on file  . Physically Abused: Not on file  . Sexually Abused: Not on file      PHYSICAL EXAM  Vitals:   04/02/20 0855  BP: 133/74  Pulse: 74  Weight: 184 lb (83.5 kg)  Height: 5\' 9"  (1.753 m)   Body mass index is 27.17 kg/m.   MMSE - Mini Mental State Exam 04/02/2020 10/01/2019 04/03/2019  Orientation to time 4 5 4   Orientation to Place 5 4 5   Registration 3 3 3   Attention/ Calculation 5 4 4   Recall 2 0 1  Language- name 2 objects 2 2 2   Language- repeat 1 1 1   Language- follow 3 step command 3 3 3   Language- read & follow direction 1 1 1   Write a sentence 1 1 1   Copy design 1 1 1   Total score 28 25 26      Generalized: Well developed, in no acute distress   Neurological examination  Mentation: Alert oriented to time, place, history taking. Follows all commands speech and language fluent Cranial nerve II-XII: Pupils were equal round reactive to light. Extraocular movements were full, visual field were full on confrontational test.  Head turning and shoulder shrug  were normal and symmetric. Motor: The motor testing reveals 5 over 5 strength of all 4 extremities. Good symmetric motor tone is noted throughout.  Sensory: Sensory testing is intact to soft touch on all 4 extremities. No evidence of extinction is noted.  Coordination: Cerebellar testing reveals good finger-nose-finger and heel-to-shin bilaterally.  Gait and station: Gait is normal.  Reflexes: Deep tendon reflexes are symmetric and normal bilaterally.   DIAGNOSTIC DATA (LABS, IMAGING, TESTING) - I reviewed patient records, labs, notes, testing and imaging myself where available.  Lab Results  Component  Value Date   WBC 7.8 03/27/2020   HGB 13.9 03/27/2020   HCT 41.4 03/27/2020   MCV 96.5 03/27/2020   PLT 261 03/27/2020      Component Value Date/Time   NA 139 03/27/2020 0910   NA 142 10/01/2019 1113   K 4.6 03/27/2020 0910   CL 105 03/27/2020 0910   CO2 25 03/27/2020 0910   GLUCOSE 94 03/27/2020 0910   BUN 28 (H) 03/27/2020 0910   BUN 19 10/01/2019 1113   CREATININE 1.04 03/27/2020 0910   CALCIUM 10.7 (H) 03/27/2020 0910   PROT 6.6 03/27/2020 0910   PROT 6.2 10/01/2019 1113   ALBUMIN 4.2 10/01/2019 1113   AST 21 03/27/2020 0910   ALT 15 03/27/2020 0910   ALKPHOS 57 10/01/2019 1113   BILITOT 0.4 03/27/2020 0910   BILITOT 0.3 10/01/2019 1113   GFRNONAA 67 03/27/2020 0910   GFRAA 78 03/27/2020 0910   Lab Results  Component Value Date   CHOL 140 03/27/2020   HDL 53 03/27/2020   LDLCALC 69 03/27/2020   TRIG 93 03/27/2020   CHOLHDL 2.6 03/27/2020   Lab Results  Component Value Date   HGBA1C 5.5 03/27/2020   Lab Results  Component Value Date   LAGTXMIW80 321 10/01/2019   Lab Results  Component Value Date   TSH 1.53 12/11/2018      ASSESSMENT AND PLAN 79 y.o. year old male  has a past medical history of Abdominal pain, Abnormal stress test (09/15/10), Acid reflux, CAD (coronary artery disease), Colon polyps, Complete heart block [facility]) (May 2011), Diverticulosis,  Glucose intolerance (impaired glucose tolerance), Hemorrhoids, Hiatal hernia, History of colonoscopy, HTN (hypertension), Hypercholesterolemia, Memory impairment, Pacemaker, PVC's (premature ventricular contractions), Sinusitis, Sleep apnea, and Ventral hernia. here with:  1.  Memory disturbance   Memory score improved MMSE 28/30  We will continue to monitor-decrease in score was most likely due to grief from the loss of his wife  2.  Obstructive sleep apnea on CPAP   Encouraged patient to continue using nightly and greater than 4 hours each night  Follow-up in 6 months or sooner if needed   I  spent 30 minutes of face-to-face and non-face-to-face time with patient.  This included previsit chart review, lab review, study review, order entry, electronic health record documentation, patient education.  Ward Givens, MSN, NP-C 04/02/2020, 9:04 AM [facility] [address] [place], [place] [address] [phone]  .

## 2020-04-07 DIAGNOSIS — Z961 Presence of intraocular lens: Secondary | ICD-10-CM | POA: Diagnosis not present

## 2020-04-07 DIAGNOSIS — H353132 Nonexudative age-related macular degeneration, bilateral, intermediate dry stage: Secondary | ICD-10-CM | POA: Diagnosis not present

## 2020-04-17 ENCOUNTER — Telehealth: Payer: Self-pay | Admitting: Internal Medicine

## 2020-04-17 NOTE — Telephone Encounter (Signed)
Robert Ramsey [phone]  [name] called to say that his step daughter called to say she had tested positive for COVID-19 today. He was around her on Sunday and she started getting sick on Monday. They did have their mask on. He does not have any symptoms at this time. He will monitor for symptoms and call back if he starts having some. He has had 2 Moderma vaccines

## 2020-04-22 ENCOUNTER — Ambulatory Visit: Payer: Medicare Other | Admitting: Orthopedic Surgery

## 2020-04-28 ENCOUNTER — Other Ambulatory Visit: Payer: Self-pay

## 2020-04-28 ENCOUNTER — Encounter: Payer: Self-pay | Admitting: Orthopedic Surgery

## 2020-04-28 ENCOUNTER — Ambulatory Visit (INDEPENDENT_AMBULATORY_CARE_PROVIDER_SITE_OTHER): Payer: Medicare Other | Admitting: Physician Assistant

## 2020-04-28 DIAGNOSIS — H35722 Serous detachment of retinal pigment epithelium, left eye: Secondary | ICD-10-CM | POA: Diagnosis not present

## 2020-04-28 DIAGNOSIS — H35453 Secondary pigmentary degeneration, bilateral: Secondary | ICD-10-CM | POA: Diagnosis not present

## 2020-04-28 DIAGNOSIS — H5315 Visual distortions of shape and size: Secondary | ICD-10-CM | POA: Diagnosis not present

## 2020-04-28 DIAGNOSIS — M25511 Pain in right shoulder: Secondary | ICD-10-CM

## 2020-04-28 DIAGNOSIS — H35363 Drusen (degenerative) of macula, bilateral: Secondary | ICD-10-CM | POA: Diagnosis not present

## 2020-04-28 DIAGNOSIS — H353132 Nonexudative age-related macular degeneration, bilateral, intermediate dry stage: Secondary | ICD-10-CM | POA: Diagnosis not present

## 2020-04-28 DIAGNOSIS — I2510 Atherosclerotic heart disease of native coronary artery without angina pectoris: Secondary | ICD-10-CM

## 2020-04-28 NOTE — Progress Notes (Signed)
Office Visit Note   Patient: Robert Ramsey           Date of Birth: [birth year]           MRN: [id number] Visit Date: 04/28/2020              Requested by: Elby Showers, MD [address] [place],  [place] [id number] PCP: Elby Showers, MD  No chief complaint on file.     HPI: This is a pleasant 79 year old gentleman with a history of injury to his right shoulder when he was swatting at bees.  3 weeks ago findings were consistent with to a partial rotator cuff tear.  This is more of a pain issue and he actually has fairly good range of motion.  He did say that the injection helped for only about a day.  He is wondering what the next step might be.  Assessment & Plan: Visit Diagnoses: No diagnosis found.  Plan: Given the patient has fairly good range of motion I would recommend a shoulder strengthening program with physical therapy.  He will do this for a period of 4 weeks and then follow-up.  He does have a pacemaker so he cannot have an MRI easily.  If he did not have improvement with physical therapy and still had significant painful symptoms it may be reasonable given his examination and his plain x-ray findings of some elevation of the humeral head to go forward with an arthroscopy  Follow-Up Instructions: No follow-ups on file.   Ortho Exam  Patient is alert, oriented, no adenopathy, well-dressed, normal affect, normal respiratory effort. Right shoulder he has forward elevation to 170 degrees.  He has slightly less inward rotation up his back.  Strength is 4 out of 5.  He does have some impingement findings.  Imaging: No results found. No images are attached to the encounter.  Labs: Lab Results  Component Value Date   HGBA1C 5.5 03/27/2020   HGBA1C 5.4 03/26/2019   HGBA1C 5.3 12/22/2018   ESRSEDRATE 12 11/06/2015   ESRSEDRATE 42 (H) 08/17/2013   LABURIC 6.7 11/06/2015   LABURIC 6.6 08/17/2013     Lab Results  Component Value Date   ALBUMIN 4.2  10/01/2019   ALBUMIN 4.4 02/26/2019   ALBUMIN 4.4 02/15/2018   PREALBUMIN 22 03/26/2019   PREALBUMIN 20 (L) 01/15/2019   PREALBUMIN 19 (L) 12/22/2018   LABURIC 6.7 11/06/2015   LABURIC 6.6 08/17/2013    No results found for: MG No results found for: VD25OH  Lab Results  Component Value Date   PREALBUMIN 22 03/26/2019   PREALBUMIN 20 (L) 01/15/2019   PREALBUMIN 19 (L) 12/22/2018   CBC EXTENDED Latest Ref Rng & Units 03/27/2020 10/01/2019 03/19/2019  WBC 3.8 - 10.8 Thousand/uL 7.8 5.0 4.4  RBC 4.20 - 5.80 Million/uL 4.29 4.23 4.16(L)  HGB 13.2 - 17.1 g/dL 13.9 13.6 13.3  HCT 38 - 50 % 41.4 40.6 40.3  PLT 140 - 400 Thousand/uL 261 213 204  NEUTROABS 1,500 - 7,800 cells/uL 5,655 3.4 2,807  LYMPHSABS 850 - 3,900 cells/uL 1,576 0.9 1,038     There is no height or weight on file to calculate BMI.  Orders:  No orders of the defined types were placed in this encounter.  No orders of the defined types were placed in this encounter.    Procedures: No procedures performed  Clinical Data: No additional findings.  ROS:  All other systems negative, except as noted in the HPI. Review  of Systems  Objective: Vital Signs: There were no vitals taken for this visit.  Specialty Comments:  No specialty comments available.  PMFS History: Patient Active Problem List   Diagnosis Date Noted  . Weight loss 12/30/2018  . Memory loss 01/15/2015  . Excessive daytime sleepiness 01/15/2015  . Hx of partial nephrectomy 09/20/2011  . CAD (coronary artery disease) 02/17/2011  . Hyperlipidemia 02/17/2011  . Pacemaker-Medtronic 02/17/2011  . Complete heart block ([place]) 11/06/2010  . Hypertension 11/06/2010   Past Medical History:  Diagnosis Date  . Abdominal pain   . Abnormal stress test 09/15/10   SEPTAL DEFECT SECONDARY TO PREVIOUS INFARCT VS LBBB; MILD SEPTAL ISCHEMIA  . Acid reflux   . CAD (coronary artery disease)    cath in 2007 reveals mild diffuse CAD  . Colon polyps   .  Complete heart block [facility]) May 2011   has PTVP in place  . Diverticulosis   . Glucose intolerance (impaired glucose tolerance)   . Hemorrhoids   . Hiatal hernia   . History of colonoscopy   . HTN (hypertension)   . Hypercholesterolemia   . Memory impairment    better with lower dose statin  . Pacemaker   . PVC's (premature ventricular contractions)   . Sinusitis   . Sleep apnea   . Ventral hernia     Family History  Problem Relation Age of Onset  . Heart failure Father   . Leukemia Mother     Past Surgical History:  Procedure Laterality Date  . APPENDECTOMY    . CARDIAC CATHETERIZATION  07/29/2005   EF 50-55%; Has mild diffuse CAD  . cataract surgery  06/2009, 10/2014  . COLONOSCOPY    . CPAP    . LBBB    . NOSE SURGERY    . PACEMAKER INSERTION  11/18/09   MDT implanted by Dr Doreatha Lew  . REFRACTIVE SURGERY Bilateral   . right nephrectomy  01/2006   partial for mass   Social History   Occupational History  . Occupation: Retired    Comment: [employer]  Tobacco Use  . Smoking status: Former Smoker    Quit date: 07/06/1983    Years since quitting: 36.8  . Smokeless tobacco: Never Used  . Tobacco comment: stopped smoking in 1985  Substance and Sexual Activity  . Alcohol use: No  . Drug use: No  . Sexual activity: Not Currently

## 2020-05-12 DIAGNOSIS — H35453 Secondary pigmentary degeneration, bilateral: Secondary | ICD-10-CM | POA: Diagnosis not present

## 2020-05-12 DIAGNOSIS — H353112 Nonexudative age-related macular degeneration, right eye, intermediate dry stage: Secondary | ICD-10-CM | POA: Diagnosis not present

## 2020-05-12 DIAGNOSIS — H353221 Exudative age-related macular degeneration, left eye, with active choroidal neovascularization: Secondary | ICD-10-CM | POA: Diagnosis not present

## 2020-05-12 DIAGNOSIS — H25813 Combined forms of age-related cataract, bilateral: Secondary | ICD-10-CM | POA: Diagnosis not present

## 2020-05-12 DIAGNOSIS — H35721 Serous detachment of retinal pigment epithelium, right eye: Secondary | ICD-10-CM | POA: Diagnosis not present

## 2020-05-12 DIAGNOSIS — H35363 Drusen (degenerative) of macula, bilateral: Secondary | ICD-10-CM | POA: Diagnosis not present

## 2020-05-13 ENCOUNTER — Ambulatory Visit (INDEPENDENT_AMBULATORY_CARE_PROVIDER_SITE_OTHER): Payer: Medicare Other | Admitting: Physical Therapy

## 2020-05-13 ENCOUNTER — Encounter: Payer: Self-pay | Admitting: Physical Therapy

## 2020-05-13 ENCOUNTER — Other Ambulatory Visit: Payer: Self-pay

## 2020-05-13 DIAGNOSIS — R293 Abnormal posture: Secondary | ICD-10-CM

## 2020-05-13 DIAGNOSIS — M25611 Stiffness of right shoulder, not elsewhere classified: Secondary | ICD-10-CM

## 2020-05-13 DIAGNOSIS — M25511 Pain in right shoulder: Secondary | ICD-10-CM

## 2020-05-13 DIAGNOSIS — M6281 Muscle weakness (generalized): Secondary | ICD-10-CM

## 2020-05-13 NOTE — Patient Instructions (Signed)
Access Code: [email or web address] URL: [email or web address] Date: 05/13/2020 Prepared by: Faustino Congress  Exercises Standing Row with Anchored Resistance - 2 x daily - 7 x weekly - 2 sets - 10 reps Standing Shoulder External Rotation with Resistance - 2 x daily - 7 x weekly - 2 sets - 10 reps - 1-2 sec hold Shoulder Internal Rotation with Resistance - 2 x daily - 7 x weekly - 2 sets - 10 reps Standing Shoulder Abduction AAROM with Dowel - 2 x daily - 7 x weekly - 2 sets - 10 reps

## 2020-05-13 NOTE — Therapy (Signed)
[facility] Physical Therapy [address] [place], [place], [address] Phone: [phone]   Fax:  [phone]  Physical Therapy Evaluation  Patient Details  Name: Robert Ramsey MRN: [id number] Date of Birth: 06-12-1941 Referring Provider (PT): Persons, Bevely Palmer, [place]   Encounter Date: 05/13/2020   PT End of Session - 05/13/20 1317    Visit Number 1    Number of Visits 12    Date for PT Re-Evaluation 06/24/20    PT Start Time 1013    PT Stop Time 1050    PT Time Calculation (min) 37 min    Activity Tolerance Patient tolerated treatment well    Behavior During Therapy [facility] for tasks assessed/performed           Past Medical History:  Diagnosis Date  . Abdominal pain   . Abnormal stress test 09/15/10   SEPTAL DEFECT SECONDARY TO PREVIOUS INFARCT VS LBBB; MILD SEPTAL ISCHEMIA  . Acid reflux   . CAD (coronary artery disease)    cath in 2007 reveals mild diffuse CAD  . Colon polyps   . Complete heart block [facility]) May 2011   has PTVP in place  . Diverticulosis   . Glucose intolerance (impaired glucose tolerance)   . Hemorrhoids   . Hiatal hernia   . History of colonoscopy   . HTN (hypertension)   . Hypercholesterolemia   . Memory impairment    better with lower dose statin  . Pacemaker   . PVC's (premature ventricular contractions)   . Sinusitis   . Sleep apnea   . Ventral hernia     Past Surgical History:  Procedure Laterality Date  . APPENDECTOMY    . CARDIAC CATHETERIZATION  07/29/2005   EF 50-55%; Has mild diffuse CAD  . cataract surgery  06/2009, 10/2014  . COLONOSCOPY    . CPAP    . LBBB    . NOSE SURGERY    . PACEMAKER INSERTION  11/18/09   MDT implanted by Dr Doreatha Lew  . REFRACTIVE SURGERY Bilateral   . right nephrectomy  01/2006   partial for mass    There were no vitals filed for this visit.    Subjective Assessment - 05/13/20 1017    Subjective Pt is an 79 y/o male who presents to OPPT for Rt shoulder pain x 1 month.  He  reports about a month ago he was washing windows near a bees nest, and in the process of swatting bees off developed Rt shoulder pain a day or two later.  He now has continued pain and weakness in Rt shoulder.    Limitations House hold activities;Lifting    Diagnostic tests xrays: superior migration of the humeral head within the glenoid    Patient Stated Goals improve pain and function    Currently in Pain? Yes    Pain Score 0-No pain   up to 8/10   Pain Location Shoulder    Pain Orientation Right    Pain Descriptors / Indicators Sharp    Pain Type Acute pain    Pain Onset More than a month ago    Pain Frequency Intermittent    Aggravating Factors  internal/external rotation in abduction    Pain Relieving Factors avoiding aggravating motion              [facility] PT Assessment - 05/13/20 1021      Assessment   Medical Diagnosis M25.511 (ICD-10-CM) - Acute pain of right shoulder    Referring Provider (PT) Persons,  Bevely Palmer, [place]    Onset Date/Surgical Date --   ~ 1 month ago   Hand Dominance Right    Next MD Visit PRN    Prior Therapy none      Precautions   Precautions None      Restrictions   Weight Bearing Restrictions No      Balance Screen   Has the patient fallen in the past 6 months No    Has the patient had a decrease in activity level because of a fear of falling?  No    Is the patient reluctant to leave their home because of a fear of falling?  No      Home Ecologist residence    Additional Comments pain with putting on coat, otherwise no issues      Prior Function   Level of Independence Independent    Vocation Retired    Biomedical scientist retired from [employer], puzzles, out to eat; no regular exercise; was volunteering at [employer] through [employer] - had to stop due to lifting bags of food and shoulder      Cognition   Overall Cognitive Status Within Functional Limits for tasks assessed       Observation/Other Assessments   Focus on Therapeutic Outcomes (FOTO)  56 (predicted 67)      Posture/Postural Control   Posture/Postural Control Postural limitations    Postural Limitations Rounded Shoulders;Forward head      ROM / Strength   AROM / PROM / Strength AROM;Strength      AROM   Overall AROM Comments Rt shoulder WNL except: pain at 90 deg abduction; Rt internal rotation to L4/5      Strength   Overall Strength Comments pain with flexion and abduction    Strength Assessment Site Shoulder    Right/Left Shoulder Right;Left    Right Shoulder Flexion 3+/5    Right Shoulder ABduction 3+/5    Right Shoulder Internal Rotation 5/5    Right Shoulder External Rotation 4/5    Left Shoulder Flexion 5/5    Left Shoulder Extension 5/5    Left Shoulder ABduction 5/5    Left Shoulder Internal Rotation 5/5    Left Shoulder External Rotation 5/5      Palpation   Palpation comment no significant trigger points noted today      Special Tests    Special Tests Rotator Cuff Impingement    Rotator Cuff Impingment tests Michel Bickers test;Drop Arm test      Hawkins-Kennedy test   Findings Positive    Side Right      Drop Arm test   Findings Positive    Side Right    Comment mild drop on Rt with pain                      Objective measurements completed on examination: See above findings.       [place] Adult PT Treatment/Exercise - 05/13/20 1021      Exercises   Exercises Other Exercises    Other Exercises  see pt instructions - performed 3-5 reps of each exercise with mod cues for technique needed                  PT Education - 05/13/20 1317    Education Details HEP    Person(s) Educated Patient    Methods Explanation;Demonstration;Handout    Comprehension Verbalized understanding;Returned demonstration;Verbal  cues required;Need further instruction            PT Short Term Goals - 05/13/20 1322      PT SHORT TERM GOAL #1   Title  independent with initial HEP    Status New    Target Date 06/03/20             PT Long Term Goals - 05/13/20 1322      PT LONG TERM GOAL #1   Title independent with final HEP for strengthening and posture    Status New    Target Date 06/24/20      PT LONG TERM GOAL #2   Title perform Rt shoulder abduction without pain for improved function    Status New    Target Date 06/24/20      PT LONG TERM GOAL #3   Title FOTO score improved to 67 for improved function    Status New    Target Date 06/24/20      PT LONG TERM GOAL #4   Title demonstrate 4/5 Rt shoulder flexion and abduction for improved function    Status New    Target Date 06/24/20      PT LONG TERM GOAL #5   Title report 75% imrpovement in pain for improved sleep and in preparation for return to volunteer activity    Status New    Target Date 06/24/20                  Plan - 05/13/20 1318    Clinical Impression Statement Pt is an 79 y/o male who presents to OPPT with Rt shoulder pain x 1 month following an injury due to trying to fight off bees.  Pt demonstrates pain, postural abnormalities, decreased strength and mild ROM deficits.  Pt will benefit from PT to address deficits listed.    Personal Factors and Comorbidities Age;Comorbidity 3+    Comorbidities CAD, HTN, mild cognitive impairment, pacemaker    Examination-Activity Limitations Bathing;Dressing;Hygiene/Grooming;Sleep;Lift    Examination-Participation Restrictions Cleaning;Laundry;Yard Work;Volunteer    Stability/Clinical Decision Making Evolving/Moderate complexity    Clinical Decision Making Moderate    Rehab Potential Good    PT Frequency 2x / week    PT Duration 6 weeks    PT Treatment/Interventions ADLs/Self Care Home Management;Moist Heat;Cryotherapy;Therapeutic activities;Functional mobility training;Therapeutic exercise;Neuromuscular re-education;Patient/family education;Manual techniques;Taping;Dry needling;Passive range of  motion;Vasopneumatic Device    PT Next Visit Plan review HEP, work on strengthening, functional IR stretch, no Estim due to pacemaker    PT Home Exercise Plan Access Code: [email or web address]    Consulted and Agree with Plan of Care Patient           Patient will benefit from skilled therapeutic intervention in order to improve the following deficits and impairments:  Decreased range of motion, Decreased strength, Impaired UE functional use, Postural dysfunction, Pain  Visit Diagnosis: Acute pain of right shoulder - Plan: PT plan of care cert/re-cert  Stiffness of right shoulder, not elsewhere classified - Plan: PT plan of care cert/re-cert  Abnormal posture - Plan: PT plan of care cert/re-cert  Muscle weakness (generalized) - Plan: PT plan of care cert/re-cert     Problem List Patient Active Problem List   Diagnosis Date Noted  . Weight loss 12/30/2018  . Memory loss 01/15/2015  . Excessive daytime sleepiness 01/15/2015  . Hx of partial nephrectomy 09/20/2011  . CAD (coronary artery disease) 02/17/2011  . Hyperlipidemia 02/17/2011  . Pacemaker-Medtronic 02/17/2011  . Complete heart block ([place]) 11/06/2010  .  Hypertension 11/06/2010       Laureen Abrahams, PT, DPT 05/13/20 1:29 PM     [facility] [address] [place], [place], [address] Phone: [phone]   Fax:  [phone]  Name: Robert Ramsey MRN: [id number] Date of Birth: January 01, 1941

## 2020-05-14 ENCOUNTER — Ambulatory Visit (INDEPENDENT_AMBULATORY_CARE_PROVIDER_SITE_OTHER): Payer: Medicare Other

## 2020-05-14 DIAGNOSIS — I442 Atrioventricular block, complete: Secondary | ICD-10-CM

## 2020-05-14 LAB — CUP PACEART REMOTE DEVICE CHECK
Battery Impedance: 1249 Ohm
Battery Remaining Longevity: 53 mo
Battery Voltage: 2.78 V
Brady Statistic AP VP Percent: 8 %
Brady Statistic AP VS Percent: 0 %
Brady Statistic AS VP Percent: 92 %
Brady Statistic AS VS Percent: 0 %
Date Time Interrogation Session: 20211110074007
Implantable Lead Implant Date: 20110517
Implantable Lead Implant Date: 20110517
Implantable Lead Location: 753859
Implantable Lead Location: 753860
Implantable Lead Model: 4469
Implantable Lead Model: 4470
Implantable Lead Serial Number: 543103
Implantable Lead Serial Number: 672341
Implantable Pulse Generator Implant Date: 20110517
Lead Channel Impedance Value: 631 Ohm
Lead Channel Impedance Value: 676 Ohm
Lead Channel Pacing Threshold Amplitude: 0.375 V
Lead Channel Pacing Threshold Amplitude: 0.5 V
Lead Channel Pacing Threshold Pulse Width: 0.4 ms
Lead Channel Pacing Threshold Pulse Width: 0.4 ms
Lead Channel Setting Pacing Amplitude: 2 V
Lead Channel Setting Pacing Amplitude: 2.5 V
Lead Channel Setting Pacing Pulse Width: 0.4 ms
Lead Channel Setting Sensing Sensitivity: 2 mV

## 2020-05-15 ENCOUNTER — Other Ambulatory Visit: Payer: Self-pay

## 2020-05-15 ENCOUNTER — Ambulatory Visit (INDEPENDENT_AMBULATORY_CARE_PROVIDER_SITE_OTHER): Payer: Medicare Other | Admitting: Rehabilitative and Restorative Service Providers"

## 2020-05-15 ENCOUNTER — Encounter: Payer: Self-pay | Admitting: Rehabilitative and Restorative Service Providers"

## 2020-05-15 DIAGNOSIS — M25611 Stiffness of right shoulder, not elsewhere classified: Secondary | ICD-10-CM | POA: Diagnosis not present

## 2020-05-15 DIAGNOSIS — M25511 Pain in right shoulder: Secondary | ICD-10-CM | POA: Diagnosis not present

## 2020-05-15 DIAGNOSIS — R293 Abnormal posture: Secondary | ICD-10-CM | POA: Diagnosis not present

## 2020-05-15 DIAGNOSIS — M6281 Muscle weakness (generalized): Secondary | ICD-10-CM

## 2020-05-15 NOTE — Progress Notes (Signed)
Remote pacemaker transmission.   

## 2020-05-15 NOTE — Therapy (Signed)
[facility] Physical Therapy [address] [place], [place], [address] Phone: [phone]   Fax:  [phone]  Physical Therapy Treatment  Patient Details  Name: Robert Ramsey MRN: [id number] Date of Birth: Mar 09, 1941 Referring Provider (PT): Persons, Bevely Palmer, [place]   Encounter Date: 05/15/2020   PT End of Session - 05/15/20 1622    Visit Number 2    Number of Visits 12    Date for PT Re-Evaluation 06/24/20    PT Start Time 4656    PT Stop Time 1428    PT Time Calculation (min) 43 min    Activity Tolerance Patient tolerated treatment well    Behavior During Therapy [facility] for tasks assessed/performed           Past Medical History:  Diagnosis Date  . Abdominal pain   . Abnormal stress test 09/15/10   SEPTAL DEFECT SECONDARY TO PREVIOUS INFARCT VS LBBB; MILD SEPTAL ISCHEMIA  . Acid reflux   . CAD (coronary artery disease)    cath in 2007 reveals mild diffuse CAD  . Colon polyps   . Complete heart block [facility]) May 2011   has PTVP in place  . Diverticulosis   . Glucose intolerance (impaired glucose tolerance)   . Hemorrhoids   . Hiatal hernia   . History of colonoscopy   . HTN (hypertension)   . Hypercholesterolemia   . Memory impairment    better with lower dose statin  . Pacemaker   . PVC's (premature ventricular contractions)   . Sinusitis   . Sleep apnea   . Ventral hernia     Past Surgical History:  Procedure Laterality Date  . APPENDECTOMY    . CARDIAC CATHETERIZATION  07/29/2005   EF 50-55%; Has mild diffuse CAD  . cataract surgery  06/2009, 10/2014  . COLONOSCOPY    . CPAP    . LBBB    . NOSE SURGERY    . PACEMAKER INSERTION  11/18/09   MDT implanted by Dr Doreatha Lew  . REFRACTIVE SURGERY Bilateral   . right nephrectomy  01/2006   partial for mass    There were no vitals filed for this visit.   Subjective Assessment - 05/15/20 1621    Subjective Robert Ramsey reports early HEP compliance.  Questions were answered regarding exercises  during today's visit.    Limitations House hold activities;Lifting    Diagnostic tests xrays: superior migration of the humeral head within the glenoid    Patient Stated Goals improve pain and function    Currently in Pain? No/denies    Pain Onset More than a month ago              [facility] PT Assessment - 05/15/20 0001      AROM   Overall AROM  Deficits    AROM Assessment Site Shoulder    Right/Left Shoulder Right    Right Shoulder Flexion 150 Degrees    Right Shoulder Internal Rotation 40 Degrees    Right Shoulder External Rotation 70 Degrees    Right Shoulder Horizontal  ADduction 30 Degrees                         [facility] Adult PT Treatment/Exercise - 05/15/20 0001      Exercises   Exercises Shoulder      Shoulder Exercises: Supine   Protraction Strengthening;Right;10 reps   3 seconds   External Rotation AROM;Right;10 reps   10 seconds   Internal Rotation AROM;Right;10 reps  10 seconds     Shoulder Exercises: Standing   External Rotation Strengthening;Right;10 reps;Theraband   slow eccentrics   Theraband Level (Shoulder External Rotation) Level 2 (Red)    Internal Rotation AROM;Right;10 reps   10 seconds thumb up the back   Internal Rotation Limitations IR theraband R 10X 3 seconds slow eccentrics (Red)    Row Strengthening;Both;20 reps;Theraband   Pull to chest   Theraband Level (Shoulder Row) Level 2 (Red)                  PT Education - 05/15/20 1621    Education Details Reviewed HEP with minor modifications including shoulder IR AROM stretching.    Person(s) Educated Patient    Methods Explanation;Demonstration;Verbal cues;Handout    Comprehension Verbalized understanding;Returned demonstration;Need further instruction;Verbal cues required            PT Short Term Goals - 05/13/20 1322      PT SHORT TERM GOAL #1   Title independent with initial HEP    Status New    Target Date 06/03/20             PT Long Term Goals -  05/13/20 1322      PT LONG TERM GOAL #1   Title independent with final HEP for strengthening and posture    Status New    Target Date 06/24/20      PT LONG TERM GOAL #2   Title perform Rt shoulder abduction without pain for improved function    Status New    Target Date 06/24/20      PT LONG TERM GOAL #3   Title FOTO score improved to 67 for improved function    Status New    Target Date 06/24/20      PT LONG TERM GOAL #4   Title demonstrate 4/5 Rt shoulder flexion and abduction for improved function    Status New    Target Date 06/24/20      PT LONG TERM GOAL #5   Title report 75% imrpovement in pain for improved sleep and in preparation for return to volunteer activity    Status New    Target Date 06/24/20                 Plan - 05/15/20 1622    Clinical Impression Statement Robert Ramsey reports early HEP compliance and questions with his program were answered today.  Some capsular tightness was noted and addressed.  Strength is the main focus and will be progressed as appropriate to meet LTGs.    Personal Factors and Comorbidities Age;Comorbidity 3+    Comorbidities CAD, HTN, mild cognitive impairment, pacemaker    Examination-Activity Limitations Bathing;Dressing;Hygiene/Grooming;Sleep;Lift    Examination-Participation Restrictions Cleaning;Laundry;Yard Work;Volunteer    Stability/Clinical Decision Making Evolving/Moderate complexity    Rehab Potential Good    PT Frequency 2x / week    PT Duration 6 weeks    PT Treatment/Interventions ADLs/Self Care Home Management;Moist Heat;Cryotherapy;Therapeutic activities;Functional mobility training;Therapeutic exercise;Neuromuscular re-education;Patient/family education;Manual techniques;Taping;Dry needling;Passive range of motion;Vasopneumatic Device    PT Next Visit Plan review HEP, work on strengthening, functional IR stretch, no Estim due to pacemaker    PT Home Exercise Plan Access Code: [email or web address]    Consulted and Agree with  Plan of Care Patient           Patient will benefit from skilled therapeutic intervention in order to improve the following deficits and impairments:  Decreased range of motion, Decreased strength, Impaired UE functional use, Postural  dysfunction, Pain  Visit Diagnosis: Acute pain of right shoulder  Stiffness of right shoulder, not elsewhere classified  Abnormal posture  Muscle weakness (generalized)     Problem List Patient Active Problem List   Diagnosis Date Noted  . Weight loss 12/30/2018  . Memory loss 01/15/2015  . Excessive daytime sleepiness 01/15/2015  . Hx of partial nephrectomy 09/20/2011  . CAD (coronary artery disease) 02/17/2011  . Hyperlipidemia 02/17/2011  . Pacemaker-Medtronic 02/17/2011  . Complete heart block ([place]) 11/06/2010  . Hypertension 11/06/2010    Farley Ly PT, MPT 05/15/2020, 4:24 PM  [facility] Physical Therapy [address] [place], [place], [address] Phone: [phone]   Fax:  [phone]  Name: Robert Ramsey MRN: [id number] Date of Birth: [birth year]

## 2020-05-22 ENCOUNTER — Encounter: Payer: Medicare Other | Admitting: Rehabilitative and Restorative Service Providers"

## 2020-05-22 DIAGNOSIS — H353221 Exudative age-related macular degeneration, left eye, with active choroidal neovascularization: Secondary | ICD-10-CM | POA: Diagnosis not present

## 2020-05-23 ENCOUNTER — Ambulatory Visit (INDEPENDENT_AMBULATORY_CARE_PROVIDER_SITE_OTHER): Payer: Medicare Other | Admitting: Rehabilitative and Restorative Service Providers"

## 2020-05-23 ENCOUNTER — Other Ambulatory Visit: Payer: Self-pay

## 2020-05-23 ENCOUNTER — Encounter: Payer: Self-pay | Admitting: Rehabilitative and Restorative Service Providers"

## 2020-05-23 ENCOUNTER — Telehealth: Payer: Self-pay | Admitting: Rehabilitative and Restorative Service Providers"

## 2020-05-23 DIAGNOSIS — M6281 Muscle weakness (generalized): Secondary | ICD-10-CM

## 2020-05-23 DIAGNOSIS — R293 Abnormal posture: Secondary | ICD-10-CM | POA: Diagnosis not present

## 2020-05-23 DIAGNOSIS — M25611 Stiffness of right shoulder, not elsewhere classified: Secondary | ICD-10-CM

## 2020-05-23 DIAGNOSIS — G8929 Other chronic pain: Secondary | ICD-10-CM

## 2020-05-23 DIAGNOSIS — M25511 Pain in right shoulder: Secondary | ICD-10-CM | POA: Diagnosis not present

## 2020-05-23 NOTE — Telephone Encounter (Signed)
Robert Ramsey and reminded him of his appointment Tuesday November 30 at 10:15.  Asked him to call and cancel if he can't make it.

## 2020-05-23 NOTE — Therapy (Signed)
[facility] [address] [place], [place], [address] Phone: [phone]   Fax:  [phone]  Physical Therapy Treatment  Patient Details  Name: Robert Ramsey MRN: [id number] Date of Birth: 05-Nov-1940 Referring Provider (PT): Persons, Bevely Palmer, [place]   Encounter Date: 05/23/2020   PT End of Session - 05/23/20 1616    Visit Number 3    Number of Visits 12    Date for PT Re-Evaluation 06/24/20    PT Start Time 1829    PT Stop Time 1616    PT Time Calculation (min) 38 min    Activity Tolerance Patient tolerated treatment well    Behavior During Therapy [facility] for tasks assessed/performed           Past Medical History:  Diagnosis Date  . Abdominal pain   . Abnormal stress test 09/15/10   SEPTAL DEFECT SECONDARY TO PREVIOUS INFARCT VS LBBB; MILD SEPTAL ISCHEMIA  . Acid reflux   . CAD (coronary artery disease)    cath in 2007 reveals mild diffuse CAD  . Colon polyps   . Complete heart block [facility]) May 2011   has PTVP in place  . Diverticulosis   . Glucose intolerance (impaired glucose tolerance)   . Hemorrhoids   . Hiatal hernia   . History of colonoscopy   . HTN (hypertension)   . Hypercholesterolemia   . Memory impairment    better with lower dose statin  . Pacemaker   . PVC's (premature ventricular contractions)   . Sinusitis   . Sleep apnea   . Ventral hernia     Past Surgical History:  Procedure Laterality Date  . APPENDECTOMY    . CARDIAC CATHETERIZATION  07/29/2005   EF 50-55%; Has mild diffuse CAD  . cataract surgery  06/2009, 10/2014  . COLONOSCOPY    . CPAP    . LBBB    . NOSE SURGERY    . PACEMAKER INSERTION  11/18/09   MDT implanted by Dr Doreatha Lew  . REFRACTIVE SURGERY Bilateral   . right nephrectomy  01/2006   partial for mass    There were no vitals filed for this visit.   Subjective Assessment - 05/23/20 1613    Subjective Robert Ramsey notes better range and less intense pain since his last visit.    Limitations  House hold activities;Lifting    Diagnostic tests xrays: superior migration of the humeral head within the glenoid    Patient Stated Goals improve pain and function    Currently in Pain? Yes    Pain Score 2     Pain Location Shoulder    Pain Orientation Right    Pain Descriptors / Indicators Sharp    Pain Type Chronic pain    Pain Onset More than a month ago    Pain Frequency Intermittent    Aggravating Factors  Certain positions (unsure)    Pain Relieving Factors Exercises    Effect of Pain on Daily Activities Has to be careful with R UE use    Multiple Pain Sites No                             [facility] Adult PT Treatment/Exercise - 05/23/20 0001      Exercises   Exercises Shoulder      Shoulder Exercises: Supine   Protraction Strengthening;Right;20 reps;Weights   3 seconds   Protraction Weight (lbs) 3#    External Rotation AROM;Right;10 reps  10 seconds   Internal Rotation AROM;Right;10 reps   10 seconds     Shoulder Exercises: Standing   External Rotation Strengthening;Right;10 reps;Theraband   slow eccentrics   Theraband Level (Shoulder External Rotation) Level 2 (Red)    Internal Rotation AROM;Right;10 reps   10 seconds thumb up the back   Internal Rotation Limitations IR theraband R 10X 3 seconds slow eccentrics (Red)    Row Strengthening;Both;20 reps;Theraband   Pull to chest   Theraband Level (Shoulder Row) Level 4 (Blue)    Other Standing Exercises Posterior capsule stretch (HA) 10X 10 seconds                  PT Education - 05/23/20 1614    Education Details Reviewed HEP and added scapular protraction, supine IR stretch and posterior capsule stretch.    Person(s) Educated Patient    Methods Explanation;Demonstration;Tactile cues;Verbal cues;Handout    Comprehension Verbal cues required;Need further instruction;Returned demonstration;Verbalized understanding;Tactile cues required            PT Short Term Goals - 05/23/20 1615       PT SHORT TERM GOAL #1   Title independent with initial HEP    Status On-going    Target Date 06/03/20             PT Long Term Goals - 05/23/20 1615      PT LONG TERM GOAL #1   Title Independent with final HEP for strengthening and posture    Status On-going      PT LONG TERM GOAL #2   Title Perform Rt shoulder abduction without pain for improved function    Status On-going      PT LONG TERM GOAL #3   Title FOTO score improved to 67 for improved function    Status On-going      PT LONG TERM GOAL #4   Title demonstrate 4/5 Rt shoulder flexion and abduction for improved function    Status On-going      PT LONG TERM GOAL #5   Title Report 75% imrpovement in pain for improved sleep and in preparation for return to volunteer activity    Status On-going                 Plan - 05/23/20 1617    Clinical Impression Statement Robert Ramsey notes improved range and less pain since his last visit.  IR and horizontal adduction AROM are still limited and scapular/RTC weakness is noted.  Continue supervised PT to improve capsular flexibility, shoulder strength and allow Robert Ramsey to return to full function with less pain.    Personal Factors and Comorbidities Age;Comorbidity 3+    Comorbidities CAD, HTN, mild cognitive impairment, pacemaker    Examination-Activity Limitations Bathing;Dressing;Hygiene/Grooming;Sleep;Lift    Examination-Participation Restrictions Cleaning;Laundry;Yard Work;Volunteer    Stability/Clinical Decision Making Evolving/Moderate complexity    Rehab Potential Good    PT Frequency 2x / week    PT Duration 6 weeks    PT Treatment/Interventions ADLs/Self Care Home Management;Moist Heat;Cryotherapy;Therapeutic activities;Functional mobility training;Therapeutic exercise;Neuromuscular re-education;Patient/family education;Manual techniques;Taping;Dry needling;Passive range of motion;Vasopneumatic Device    PT Next Visit Plan review HEP, work on strengthening, functional IR  stretch, no Estim due to pacemaker    PT Home Exercise Plan Access Code: [email or web address]    Consulted and Agree with Plan of Care Patient           Patient will benefit from skilled therapeutic intervention in order to improve the following deficits and impairments:  Decreased range  of motion, Decreased strength, Impaired UE functional use, Postural dysfunction, Pain  Visit Diagnosis: Abnormal posture  Muscle weakness (generalized)  Stiffness of right shoulder, not elsewhere classified  Chronic right shoulder pain     Problem List Patient Active Problem List   Diagnosis Date Noted  . Weight loss 12/30/2018  . Memory loss 01/15/2015  . Excessive daytime sleepiness 01/15/2015  . Hx of partial nephrectomy 09/20/2011  . CAD (coronary artery disease) 02/17/2011  . Hyperlipidemia 02/17/2011  . Pacemaker-Medtronic 02/17/2011  . Complete heart block ([place]) 11/06/2010  . Hypertension 11/06/2010    Farley Ly PT, MPT 05/23/2020, 4:19 PM  [facility] [address] [place], [place], [address] Phone: [phone]   Fax:  [phone]  Name: TRAMAINE SAULS MRN: [id number] Date of Birth: [birth year]

## 2020-06-03 ENCOUNTER — Encounter: Payer: Self-pay | Admitting: Physical Therapy

## 2020-06-03 ENCOUNTER — Ambulatory Visit (INDEPENDENT_AMBULATORY_CARE_PROVIDER_SITE_OTHER): Payer: Medicare Other | Admitting: Physical Therapy

## 2020-06-03 ENCOUNTER — Other Ambulatory Visit: Payer: Self-pay

## 2020-06-03 DIAGNOSIS — R293 Abnormal posture: Secondary | ICD-10-CM | POA: Diagnosis not present

## 2020-06-03 DIAGNOSIS — M25511 Pain in right shoulder: Secondary | ICD-10-CM | POA: Diagnosis not present

## 2020-06-03 DIAGNOSIS — M25611 Stiffness of right shoulder, not elsewhere classified: Secondary | ICD-10-CM

## 2020-06-03 DIAGNOSIS — G8929 Other chronic pain: Secondary | ICD-10-CM | POA: Diagnosis not present

## 2020-06-03 DIAGNOSIS — M6281 Muscle weakness (generalized): Secondary | ICD-10-CM | POA: Diagnosis not present

## 2020-06-03 NOTE — Therapy (Signed)
[facility] Physical Therapy [address] [place], [place], [address] Phone: [phone]   Fax:  [phone]  Physical Therapy Treatment  Patient Details  Name: Robert Ramsey MRN: [id number] Date of Birth: 03/31/1941 Referring Provider (PT): Persons, Bevely Palmer, [place]   Encounter Date: 06/03/2020   PT End of Session - 06/03/20 1055    Visit Number 4    Number of Visits 12    Date for PT Re-Evaluation 06/24/20    PT Start Time 2595    PT Stop Time 1055    PT Time Calculation (min) 41 min    Activity Tolerance Patient tolerated treatment well    Behavior During Therapy [facility] for tasks assessed/performed           Past Medical History:  Diagnosis Date  . Abdominal pain   . Abnormal stress test 09/15/10   SEPTAL DEFECT SECONDARY TO PREVIOUS INFARCT VS LBBB; MILD SEPTAL ISCHEMIA  . Acid reflux   . CAD (coronary artery disease)    cath in 2007 reveals mild diffuse CAD  . Colon polyps   . Complete heart block [facility]) May 2011   has PTVP in place  . Diverticulosis   . Glucose intolerance (impaired glucose tolerance)   . Hemorrhoids   . Hiatal hernia   . History of colonoscopy   . HTN (hypertension)   . Hypercholesterolemia   . Memory impairment    better with lower dose statin  . Pacemaker   . PVC's (premature ventricular contractions)   . Sinusitis   . Sleep apnea   . Ventral hernia     Past Surgical History:  Procedure Laterality Date  . APPENDECTOMY    . CARDIAC CATHETERIZATION  07/29/2005   EF 50-55%; Has mild diffuse CAD  . cataract surgery  06/2009, 10/2014  . COLONOSCOPY    . CPAP    . LBBB    . NOSE SURGERY    . PACEMAKER INSERTION  11/18/09   MDT implanted by Dr Doreatha Lew  . REFRACTIVE SURGERY Bilateral   . right nephrectomy  01/2006   partial for mass    There were no vitals filed for this visit.   Subjective Assessment - 06/03/20 1019    Subjective Lt shoulder is a little sore after sleeping funny on it last night    Limitations  House hold activities;Lifting    Diagnostic tests xrays: superior migration of the humeral head within the glenoid    Patient Stated Goals improve pain and function    Currently in Pain? Yes    Pain Score 2     Pain Location Shoulder    Pain Orientation Right    Pain Descriptors / Indicators Sore    Pain Type Chronic pain    Pain Onset More than a month ago    Pain Frequency Intermittent    Aggravating Factors  certain positions, sleeping    Pain Relieving Factors exercises                             [facility] Adult PT Treatment/Exercise - 06/03/20 1014      Shoulder Exercises: Supine   Protraction Strengthening;Right   3x10; 3 sec hold   Protraction Weight (lbs) 4    External Rotation AROM;Right;10 reps   10 seconds   Internal Rotation AROM;Right;10 reps   10 seconds     Shoulder Exercises: Standing   External Rotation Strengthening;Right   3x10   Theraband Level (  Shoulder External Rotation) Level 2 (Red)    Internal Rotation Right   3x10   Theraband Level (Shoulder Internal Rotation) Level 2 (Red)    Flexion Right;Weights   3x10   Shoulder Flexion Weight (lbs) 1    ABduction Right;Weights   3x10   Shoulder ABduction Weight (lbs) 1    Row Both;Theraband   3x10   Theraband Level (Shoulder Row) Level 4 (Blue)      Shoulder Exercises: ROM/Strengthening   Proximal Shoulder Strengthening, Supine 4#, Rt circles CW/CCW x20 reps      Shoulder Exercises: Stretch   Internal Rotation Stretch Limitations 5x30 sec; Rt - standing with strap                    PT Short Term Goals - 06/03/20 1055      PT SHORT TERM GOAL #1   Title independent with initial HEP    Baseline 11/30: pt reports compliance    Status Achieved    Target Date 06/03/20             PT Long Term Goals - 05/23/20 1615      PT LONG TERM GOAL #1   Title Independent with final HEP for strengthening and posture    Status On-going      PT LONG TERM GOAL #2   Title Perform Rt  shoulder abduction without pain for improved function    Status On-going      PT LONG TERM GOAL #3   Title FOTO score improved to 67 for improved function    Status On-going      PT LONG TERM GOAL #4   Title demonstrate 4/5 Rt shoulder flexion and abduction for improved function    Status On-going      PT LONG TERM GOAL #5   Title Report 75% imrpovement in pain for improved sleep and in preparation for return to volunteer activity    Status On-going                 Plan - 06/03/20 1055    Clinical Impression Statement Pt tolerated session well today without increase in soreness and session focused mainly on strengthening.  Will continue to benefit from PT to maximize function.    Personal Factors and Comorbidities Age;Comorbidity 3+    Comorbidities CAD, HTN, mild cognitive impairment, pacemaker    Examination-Activity Limitations Bathing;Dressing;Hygiene/Grooming;Sleep;Lift    Examination-Participation Restrictions Cleaning;Laundry;Yard Work;Volunteer    Stability/Clinical Decision Making Evolving/Moderate complexity    Rehab Potential Good    PT Frequency 2x / week    PT Duration 6 weeks    PT Treatment/Interventions ADLs/Self Care Home Management;Moist Heat;Cryotherapy;Therapeutic activities;Functional mobility training;Therapeutic exercise;Neuromuscular re-education;Patient/family education;Manual techniques;Taping;Dry needling;Passive range of motion;Vasopneumatic Device    PT Next Visit Plan work on strengthening, functional IR stretch, no Estim due to pacemaker    PT Home Exercise Plan Access Code: [email or web address]    Consulted and Agree with Plan of Care Patient           Patient will benefit from skilled therapeutic intervention in order to improve the following deficits and impairments:  Decreased range of motion, Decreased strength, Impaired UE functional use, Postural dysfunction, Pain  Visit Diagnosis: Abnormal posture  Muscle weakness (generalized)  Stiffness  of right shoulder, not elsewhere classified  Chronic right shoulder pain  Acute pain of right shoulder     Problem List Patient Active Problem List   Diagnosis Date Noted  . Weight loss 12/30/2018  .  Memory loss 01/15/2015  . Excessive daytime sleepiness 01/15/2015  . Hx of partial nephrectomy 09/20/2011  . CAD (coronary artery disease) 02/17/2011  . Hyperlipidemia 02/17/2011  . Pacemaker-Medtronic 02/17/2011  . Complete heart block ([place]) 11/06/2010  . Hypertension 11/06/2010      Laureen Abrahams, PT, DPT 06/03/20 10:57 AM     [facility] Physical Therapy [address] [place], [place], [address] Phone: [phone]   Fax:  [phone]  Name: MOKSH LOOMER MRN: [id number] Date of Birth: Oct 31, 1940

## 2020-06-04 ENCOUNTER — Telehealth: Payer: Self-pay | Admitting: Internal Medicine

## 2020-06-04 ENCOUNTER — Telehealth: Payer: Self-pay | Admitting: Adult Health

## 2020-06-04 NOTE — Telephone Encounter (Signed)
Pt aware and said he will make arrangements to get it done

## 2020-06-04 NOTE — Telephone Encounter (Signed)
Yes we know the titers go down after a few months so he should get the booster.

## 2020-06-04 NOTE — Telephone Encounter (Signed)
It would be the patient's discretion if he wants to try this medication.  There is limited information regarding the effectiveness of Prevagen

## 2020-06-04 NOTE — Telephone Encounter (Signed)
Pt called and said he has had both covid shots and was wondering I you recommend him getting the booster

## 2020-06-04 NOTE — Telephone Encounter (Signed)
Pt called, taking Prevagen over the counter for memory loss. Is this worth taking for memory. Would like a call from the nurse.

## 2020-06-05 NOTE — Telephone Encounter (Signed)
Spoke to pt, relayed message from NP. PT VOICED UNDERSTANDING

## 2020-06-06 ENCOUNTER — Encounter: Payer: Self-pay | Admitting: Physical Therapy

## 2020-06-06 ENCOUNTER — Ambulatory Visit (INDEPENDENT_AMBULATORY_CARE_PROVIDER_SITE_OTHER): Payer: Medicare Other | Admitting: Physical Therapy

## 2020-06-06 ENCOUNTER — Other Ambulatory Visit: Payer: Self-pay

## 2020-06-06 DIAGNOSIS — G8929 Other chronic pain: Secondary | ICD-10-CM

## 2020-06-06 DIAGNOSIS — M25511 Pain in right shoulder: Secondary | ICD-10-CM

## 2020-06-06 DIAGNOSIS — M25611 Stiffness of right shoulder, not elsewhere classified: Secondary | ICD-10-CM | POA: Diagnosis not present

## 2020-06-06 DIAGNOSIS — R293 Abnormal posture: Secondary | ICD-10-CM

## 2020-06-06 DIAGNOSIS — M6281 Muscle weakness (generalized): Secondary | ICD-10-CM

## 2020-06-06 NOTE — Patient Instructions (Signed)
Access Code: [email or web address] URL: [email or web address] Date: 06/06/2020 Prepared by: Faustino Congress  Exercises Standing Row with Anchored Resistance - 2 x daily - 7 x weekly - 2 sets - 10 reps - 3 seconds hold Standing Shoulder External Rotation with Resistance - 2 x daily - 7 x weekly - 2 sets - 10 reps - 3 seconds sec hold Shoulder Internal Rotation with Resistance - 2 x daily - 7 x weekly - 2 sets - 10 reps - 3 second hold Standing Shoulder Internal Rotation Stretch with Hands Behind Back - 2-3 x daily - 7 x weekly - 1 sets - 10 reps - 10 second hold Standing Shoulder Posterior Capsule Stretch - 2 x daily - 7 x weekly - 1 sets - 10 reps - 10 seconds hold Supine Shoulder Internal Rotation - 2 x daily - 7 x weekly - 1 sets - 10 reps - 10 seconds hold Supine Scapular Protraction in Flexion with Dumbbells - 2 x daily - 7 x weekly - 1 sets - 20 reps - 3 seconds hold Standing Shoulder Flexion to 90 Degrees with Dumbbells - 1 x daily - 7 x weekly - 3 sets - 10 reps Standing Single Arm Shoulder Abduction with Dumbbell - Thumb Up - 1 x daily - 7 x weekly - 3 sets - 10 reps

## 2020-06-06 NOTE — Therapy (Signed)
[facility] Physical Therapy [address] [place], [place], [address] Phone: [phone]   Fax:  [phone]  Physical Therapy Treatment  Patient Details  Name: Robert Ramsey MRN: [id number] Date of Birth: 1941/03/14 Referring Provider (PT): Persons, Bevely Palmer, [place]   Encounter Date: 06/06/2020   PT End of Session - 06/06/20 1008    Visit Number 5    Number of Visits 12    Date for PT Re-Evaluation 06/24/20    PT Start Time 0931    PT Stop Time 1010    PT Time Calculation (min) 39 min    Activity Tolerance Patient tolerated treatment well    Behavior During Therapy [facility] for tasks assessed/performed           Past Medical History:  Diagnosis Date  . Abdominal pain   . Abnormal stress test 09/15/10   SEPTAL DEFECT SECONDARY TO PREVIOUS INFARCT VS LBBB; MILD SEPTAL ISCHEMIA  . Acid reflux   . CAD (coronary artery disease)    cath in 2007 reveals mild diffuse CAD  . Colon polyps   . Complete heart block [facility]) May 2011   has PTVP in place  . Diverticulosis   . Glucose intolerance (impaired glucose tolerance)   . Hemorrhoids   . Hiatal hernia   . History of colonoscopy   . HTN (hypertension)   . Hypercholesterolemia   . Memory impairment    better with lower dose statin  . Pacemaker   . PVC's (premature ventricular contractions)   . Sinusitis   . Sleep apnea   . Ventral hernia     Past Surgical History:  Procedure Laterality Date  . APPENDECTOMY    . CARDIAC CATHETERIZATION  07/29/2005   EF 50-55%; Has mild diffuse CAD  . cataract surgery  06/2009, 10/2014  . COLONOSCOPY    . CPAP    . LBBB    . NOSE SURGERY    . PACEMAKER INSERTION  11/18/09   MDT implanted by Dr Doreatha Lew  . REFRACTIVE SURGERY Bilateral   . right nephrectomy  01/2006   partial for mass    There were no vitals filed for this visit.   Subjective Assessment - 06/06/20 0932    Subjective Shoulder is doing well, still a little sore but feeling better.  Doesn't keep him up  at night anymore.    Limitations House hold activities;Lifting    Diagnostic tests xrays: superior migration of the humeral head within the glenoid    Patient Stated Goals improve pain and function    Currently in Pain? Yes    Pain Score 2     Pain Location Shoulder    Pain Orientation Right    Pain Descriptors / Indicators Sore    Pain Type Chronic pain    Pain Onset More than a month ago    Pain Frequency Intermittent    Aggravating Factors  certain positions, sleeping    Pain Relieving Factors exercises                             [facility] Adult PT Treatment/Exercise - 06/06/20 0933      Shoulder Exercises: Standing   External Rotation Strengthening;Right   3x10   External Rotation Weight (lbs) 1    Internal Rotation Right   3x10   Theraband Level (Shoulder Internal Rotation) Level 4 (Blue)    Flexion Right;Weights   3x10   Shoulder Flexion Weight (lbs) 1  ABduction Right;Weights   3x10   Shoulder ABduction Weight (lbs) 1    Row Both;Theraband   3x10   Theraband Level (Shoulder Row) Level 4 (Blue)    Other Standing Exercises weight shifting at counter to RUE - 10 x 5 sec hold      Shoulder Exercises: ROM/Strengthening   UBE (Upper Arm Bike) L4 x 6 min (3' each direction)      Shoulder Exercises: Stretch   Cross Chest Stretch 3 reps;30 seconds    Other Shoulder Stretches mid doorway stretch 3x30 sec                    PT Short Term Goals - 06/03/20 1055      PT SHORT TERM GOAL #1   Title independent with initial HEP    Baseline 11/30: pt reports compliance    Status Achieved    Target Date 06/03/20             PT Long Term Goals - 05/23/20 1615      PT LONG TERM GOAL #1   Title Independent with final HEP for strengthening and posture    Status On-going      PT LONG TERM GOAL #2   Title Perform Rt shoulder abduction without pain for improved function    Status On-going      PT LONG TERM GOAL #3   Title FOTO score improved to  67 for improved function    Status On-going      PT LONG TERM GOAL #4   Title demonstrate 4/5 Rt shoulder flexion and abduction for improved function    Status On-going      PT LONG TERM GOAL #5   Title Report 75% imrpovement in pain for improved sleep and in preparation for return to volunteer activity    Status On-going                 Plan - 06/06/20 1008    Clinical Impression Statement Pt continues to tolerate strengthening well without increase in pain.  He may be ready for d/c next week, will plan to determine at next visit.    Personal Factors and Comorbidities Age;Comorbidity 3+    Comorbidities CAD, HTN, mild cognitive impairment, pacemaker    Examination-Activity Limitations Bathing;Dressing;Hygiene/Grooming;Sleep;Lift    Examination-Participation Restrictions Cleaning;Laundry;Yard Work;Volunteer    Stability/Clinical Decision Making Evolving/Moderate complexity    Rehab Potential Good    PT Frequency 2x / week    PT Duration 6 weeks    PT Treatment/Interventions ADLs/Self Care Home Management;Moist Heat;Cryotherapy;Therapeutic activities;Functional mobility training;Therapeutic exercise;Neuromuscular re-education;Patient/family education;Manual techniques;Taping;Dry needling;Passive range of motion;Vasopneumatic Device    PT Next Visit Plan work on strengthening, functional IR stretch, no Estim due to pacemaker, see how he's doing - does he need more PT?    PT Home Exercise Plan Access Code: [email or web address]    Consulted and Agree with Plan of Care Patient           Patient will benefit from skilled therapeutic intervention in order to improve the following deficits and impairments:  Decreased range of motion, Decreased strength, Impaired UE functional use, Postural dysfunction, Pain  Visit Diagnosis: Abnormal posture  Muscle weakness (generalized)  Stiffness of right shoulder, not elsewhere classified  Chronic right shoulder pain  Acute pain of right  shoulder     Problem List Patient Active Problem List   Diagnosis Date Noted  . Weight loss 12/30/2018  . Memory loss 01/15/2015  . Excessive daytime  sleepiness 01/15/2015  . Hx of partial nephrectomy 09/20/2011  . CAD (coronary artery disease) 02/17/2011  . Hyperlipidemia 02/17/2011  . Pacemaker-Medtronic 02/17/2011  . Complete heart block ([place]) 11/06/2010  . Hypertension 11/06/2010      Laureen Abrahams, PT, DPT 06/06/20 10:15 AM    [facility] Physical Therapy [address] [place], [place], [address] Phone: [phone]   Fax:  [phone]  Name: Robert Ramsey MRN: [id number] Date of Birth: [birth year]

## 2020-06-10 ENCOUNTER — Encounter: Payer: Self-pay | Admitting: Physical Therapy

## 2020-06-10 ENCOUNTER — Other Ambulatory Visit: Payer: Self-pay

## 2020-06-10 ENCOUNTER — Ambulatory Visit (INDEPENDENT_AMBULATORY_CARE_PROVIDER_SITE_OTHER): Payer: Medicare Other | Admitting: Physical Therapy

## 2020-06-10 DIAGNOSIS — M6281 Muscle weakness (generalized): Secondary | ICD-10-CM

## 2020-06-10 DIAGNOSIS — M25611 Stiffness of right shoulder, not elsewhere classified: Secondary | ICD-10-CM

## 2020-06-10 DIAGNOSIS — M25511 Pain in right shoulder: Secondary | ICD-10-CM

## 2020-06-10 DIAGNOSIS — G8929 Other chronic pain: Secondary | ICD-10-CM

## 2020-06-10 DIAGNOSIS — R293 Abnormal posture: Secondary | ICD-10-CM | POA: Diagnosis not present

## 2020-06-10 NOTE — Therapy (Signed)
[facility] Physical Therapy [address] [place], [place], [address] Phone: [phone]   Fax:  [phone]  Physical Therapy Treatment  Patient Details  Name: Robert Ramsey MRN: [id number] Date of Birth: 02-13-1941 Referring Provider (PT): Persons, Bevely Palmer, [place]   Encounter Date: 06/10/2020   PT End of Session - 06/10/20 1007    Visit Number 6    Number of Visits 12    Date for PT Re-Evaluation 06/24/20    PT Start Time 0929    PT Stop Time 1007    PT Time Calculation (min) 38 min    Activity Tolerance Patient tolerated treatment well    Behavior During Therapy [facility] for tasks assessed/performed           Past Medical History:  Diagnosis Date  . Abdominal pain   . Abnormal stress test 09/15/10   SEPTAL DEFECT SECONDARY TO PREVIOUS INFARCT VS LBBB; MILD SEPTAL ISCHEMIA  . Acid reflux   . CAD (coronary artery disease)    cath in 2007 reveals mild diffuse CAD  . Colon polyps   . Complete heart block [facility]) May 2011   has PTVP in place  . Diverticulosis   . Glucose intolerance (impaired glucose tolerance)   . Hemorrhoids   . Hiatal hernia   . History of colonoscopy   . HTN (hypertension)   . Hypercholesterolemia   . Memory impairment    better with lower dose statin  . Pacemaker   . PVC's (premature ventricular contractions)   . Sinusitis   . Sleep apnea   . Ventral hernia     Past Surgical History:  Procedure Laterality Date  . APPENDECTOMY    . CARDIAC CATHETERIZATION  07/29/2005   EF 50-55%; Has mild diffuse CAD  . cataract surgery  06/2009, 10/2014  . COLONOSCOPY    . CPAP    . LBBB    . NOSE SURGERY    . PACEMAKER INSERTION  11/18/09   MDT implanted by Dr Doreatha Lew  . REFRACTIVE SURGERY Bilateral   . right nephrectomy  01/2006   partial for mass    There were no vitals filed for this visit.   Subjective Assessment - 06/10/20 0930    Subjective shoulder doing pretty well, worse in the mornings. sleeps on Lt side.  feels he had  plateaued    Limitations House hold activities;Lifting    Diagnostic tests xrays: superior migration of the humeral head within the glenoid    Patient Stated Goals improve pain and function    Currently in Pain? Yes    Pain Score 3     Pain Location Shoulder    Pain Orientation Right    Pain Descriptors / Indicators Sore    Pain Type Chronic pain    Pain Onset More than a month ago    Pain Frequency Intermittent    Aggravating Factors  certain positions, sleeping    Pain Relieving Factors exercises                             [facility] Adult PT Treatment/Exercise - 06/10/20 0934      Shoulder Exercises: Supine   Flexion Right   2x10, no weight, slight pain     Shoulder Exercises: Sidelying   External Rotation Right   3x10   External Rotation Weight (lbs) 2    ABduction Right;Weights   3x10   ABduction Weight (lbs) 1  Shoulder Exercises: Standing   Extension Both;Theraband   3x10   Theraband Level (Shoulder Extension) Level 4 (Blue)    Row Both;Theraband   3x10   Theraband Level (Shoulder Row) Level 4 (Blue)      Shoulder Exercises: ROM/Strengthening   UBE (Upper Arm Bike) L4 x 6 min (3' each direction)    Lat Pull Limitations 15# 3x10    Cybex Row Limitations 3x10, 15#    Wall Pushups 10 reps   from counter height, 3 sets                   PT Short Term Goals - 06/03/20 1055      PT SHORT TERM GOAL #1   Title independent with initial HEP    Baseline 11/30: pt reports compliance    Status Achieved    Target Date 06/03/20             PT Long Term Goals - 05/23/20 1615      PT LONG TERM GOAL #1   Title Independent with final HEP for strengthening and posture    Status On-going      PT LONG TERM GOAL #2   Title Perform Rt shoulder abduction without pain for improved function    Status On-going      PT LONG TERM GOAL #3   Title FOTO score improved to 67 for improved function    Status On-going      PT LONG TERM GOAL #4    Title demonstrate 4/5 Rt shoulder flexion and abduction for improved function    Status On-going      PT LONG TERM GOAL #5   Title Report 75% imrpovement in pain for improved sleep and in preparation for return to volunteer activity    Status On-going                 Plan - 06/10/20 1007    Clinical Impression Statement Pt feels he has plateaued with PT, but continues to demonstrate good tolerance to exercises and overall improved function.  He wants to hold after next session, and follow up with PA.    Personal Factors and Comorbidities Age;Comorbidity 3+    Comorbidities CAD, HTN, mild cognitive impairment, pacemaker    Examination-Activity Limitations Bathing;Dressing;Hygiene/Grooming;Sleep;Lift    Examination-Participation Restrictions Cleaning;Laundry;Yard Work;Volunteer    Stability/Clinical Decision Making Evolving/Moderate complexity    Rehab Potential Good    PT Frequency 2x / week    PT Duration 6 weeks    PT Treatment/Interventions ADLs/Self Care Home Management;Moist Heat;Cryotherapy;Therapeutic activities;Functional mobility training;Therapeutic exercise;Neuromuscular re-education;Patient/family education;Manual techniques;Taping;Dry needling;Passive range of motion;Vasopneumatic Device    PT Next Visit Plan look at [place], send to [place], likely hold?    PT Home Exercise Plan Access Code: [email or web address]    Consulted and Agree with Plan of Care Patient           Patient will benefit from skilled therapeutic intervention in order to improve the following deficits and impairments:  Decreased range of motion, Decreased strength, Impaired UE functional use, Postural dysfunction, Pain  Visit Diagnosis: Abnormal posture  Muscle weakness (generalized)  Stiffness of right shoulder, not elsewhere classified  Chronic right shoulder pain  Acute pain of right shoulder     Problem List Patient Active Problem List   Diagnosis Date Noted  . Weight loss  12/30/2018  . Memory loss 01/15/2015  . Excessive daytime sleepiness 01/15/2015  . Hx of partial nephrectomy 09/20/2011  . CAD (coronary artery  disease) 02/17/2011  . Hyperlipidemia 02/17/2011  . Pacemaker-Medtronic 02/17/2011  . Complete heart block ([place]) 11/06/2010  . Hypertension 11/06/2010      Laureen Abrahams, PT, DPT 06/10/20 10:09 AM     [facility] Physical Therapy [address] [place], [place], [address] Phone: [phone]   Fax:  [phone]  Name: Robert Ramsey MRN: [id number] Date of Birth: 18-Feb-1941

## 2020-06-12 ENCOUNTER — Encounter: Payer: Self-pay | Admitting: Physical Therapy

## 2020-06-12 ENCOUNTER — Ambulatory Visit (INDEPENDENT_AMBULATORY_CARE_PROVIDER_SITE_OTHER): Payer: Medicare Other | Admitting: Physical Therapy

## 2020-06-12 ENCOUNTER — Other Ambulatory Visit: Payer: Self-pay

## 2020-06-12 DIAGNOSIS — M25611 Stiffness of right shoulder, not elsewhere classified: Secondary | ICD-10-CM

## 2020-06-12 DIAGNOSIS — R293 Abnormal posture: Secondary | ICD-10-CM | POA: Diagnosis not present

## 2020-06-12 DIAGNOSIS — M6281 Muscle weakness (generalized): Secondary | ICD-10-CM | POA: Diagnosis not present

## 2020-06-12 DIAGNOSIS — G8929 Other chronic pain: Secondary | ICD-10-CM

## 2020-06-12 DIAGNOSIS — M25511 Pain in right shoulder: Secondary | ICD-10-CM

## 2020-06-12 NOTE — Therapy (Addendum)
[facility] [address] [place], , [address] Phone: [phone]   Fax:  [phone]  Physical Therapy Treatment/Discharge Summary  Patient Details  Name: Robert Ramsey MRN: [id number] Date of Birth: 03/17/1941 Referring Provider (PT): Persons, Mary Anne, PA   Encounter Date: 06/12/2020   PT End of Session - 06/12/20 1057    Visit Number 7    Number of Visits 12    Date for PT Re-Evaluation 06/24/20    PT Start Time 1013    PT Stop Time 1055    PT Time Calculation (min) 42 min    Activity Tolerance Patient tolerated treatment well    Behavior During Therapy WFL for tasks assessed/performed           Past Medical History:  Diagnosis Date  . Abdominal pain   . Abnormal stress test 09/15/10   SEPTAL DEFECT SECONDARY TO PREVIOUS INFARCT VS LBBB; MILD SEPTAL ISCHEMIA  . Acid reflux   . CAD (coronary artery disease)    cath in 2007 reveals mild diffuse CAD  . Colon polyps   . Complete heart block (HCC) May 2011   has PTVP in place  . Diverticulosis   . Glucose intolerance (impaired glucose tolerance)   . Hemorrhoids   . Hiatal hernia   . History of colonoscopy   . HTN (hypertension)   . Hypercholesterolemia   . Memory impairment    better with lower dose statin  . Pacemaker   . PVC's (premature ventricular contractions)   . Sinusitis   . Sleep apnea   . Ventral hernia     Past Surgical History:  Procedure Laterality Date  . APPENDECTOMY    . CARDIAC CATHETERIZATION  07/29/2005   EF 50-55%; Has mild diffuse CAD  . cataract surgery  06/2009, 10/2014  . COLONOSCOPY    . CPAP    . LBBB    . NOSE SURGERY    . PACEMAKER INSERTION  11/18/09   MDT implanted by Dr Tennant  . REFRACTIVE SURGERY Bilateral   . right nephrectomy  01/2006   partial for mass    There were no vitals filed for this visit.   Subjective Assessment - 06/12/20 1012    Subjective shoulder is "about the same"    Limitations House hold  activities;Lifting    Diagnostic tests xrays: superior migration of the humeral head within the glenoid    Patient Stated Goals improve pain and function    Currently in Pain? Yes    Pain Score 3     Pain Location Shoulder    Pain Orientation Right    Pain Descriptors / Indicators Sore    Pain Type Chronic pain    Pain Onset More than a month ago    Pain Frequency Intermittent    Aggravating Factors  certain positions, sleeping    Pain Relieving Factors exercises              [facility] PT Assessment - 06/12/20 1029      Assessment   Medical Diagnosis M25.511 (ICD-10-CM) - Acute pain of right shoulder    Referring Provider (PT) Persons, Mary Anne, PA      Observation/Other Assessments   Focus on Therapeutic Outcomes (FOTO)  61      Strength   Right Shoulder Flexion 3+/5   with pain   Right Shoulder ABduction 4+/5    Right Shoulder Internal Rotation 5/5    Right Shoulder External Rotation 5/5                           [facility] Adult PT Treatment/Exercise - 06/12/20 1014      Shoulder Exercises: Supine   External Rotation Both;Theraband   3x10   Theraband Level (Shoulder External Rotation) Level 3 (Green)      Shoulder Exercises: Standing   Internal Rotation Right   3x10   Theraband Level (Shoulder Internal Rotation) Level 4 (Blue)    Flexion Right;Weights   3x10   Shoulder Flexion Weight (lbs) 2    ABduction Right;Weights    Shoulder ABduction Weight (lbs) 2# x 10; 1# 2x10    Extension Both;Theraband   3x10   Theraband Level (Shoulder Extension) Level 4 (Blue)    Row Both;Theraband   3x10   Theraband Level (Shoulder Row) Level 4 (Blue)      Shoulder Exercises: ROM/Strengthening   UBE (Upper Arm Bike) L5 x 6 min (3' each direction)      Shoulder Exercises: Stretch   Cross Chest Stretch 5 reps;10 seconds    Internal Rotation Stretch Limitations 5x10 sec hold, hands behind back                    PT Short Term Goals - 06/03/20 1055      PT  SHORT TERM GOAL #1   Title independent with initial HEP    Baseline 11/30: pt reports compliance    Status Achieved    Target Date 06/03/20             PT Long Term Goals - 06/12/20 1058      PT LONG TERM GOAL #1   Title Independent with final HEP for strengthening and posture    Status Achieved      PT LONG TERM GOAL #2   Title Perform Rt shoulder abduction without pain for improved function    Status Achieved      PT LONG TERM GOAL #3   Title FOTO score improved to 67 for improved function    Baseline improved to 61    Status Not Met      PT LONG TERM GOAL #4   Title demonstrate 4/5 Rt shoulder flexion and abduction for improved function    Status Partially Met      PT LONG TERM GOAL #5   Title Report 75% imrpovement in pain for improved sleep and in preparation for return to volunteer activity    Baseline report no change in pain, but no difficutly sleeping    Status Partially Met                 Plan - 06/12/20 1059    Clinical Impression Statement Pt has demostrated only slight improvement with PT and is compliant with HEP without significant change.  At this time recommend return to PA to further discuss options.    Personal Factors and Comorbidities Age;Comorbidity 3+    Comorbidities CAD, HTN, mild cognitive impairment, pacemaker    Examination-Activity Limitations Bathing;Dressing;Hygiene/Grooming;Sleep;Lift    Examination-Participation Restrictions Cleaning;Laundry;Yard Work;Volunteer    Stability/Clinical Decision Making Evolving/Moderate complexity    Rehab Potential Good    PT Frequency 2x / week    PT Duration 6 weeks    PT Treatment/Interventions ADLs/Self Care Home Management;Moist Heat;Cryotherapy;Therapeutic activities;Functional mobility training;Therapeutic exercise;Neuromuscular re-education;Patient/family education;Manual techniques;Taping;Dry needling;Passive range of motion;Vasopneumatic Device    PT Next Visit Plan see what PA says, hold  PT for now    PT Home Exercise Plan Access Code: [email or web address]    Consulted and Agree with Plan of Care Patient  Patient will benefit from skilled therapeutic intervention in order to improve the following deficits and impairments:  Decreased range of motion,Decreased strength,Impaired UE functional use,Postural dysfunction,Pain  Visit Diagnosis: Abnormal posture  Muscle weakness (generalized)  Stiffness of right shoulder, not elsewhere classified  Chronic right shoulder pain  Acute pain of right shoulder     Problem List Patient Active Problem List   Diagnosis Date Noted  . Weight loss 12/30/2018  . Memory loss 01/15/2015  . Excessive daytime sleepiness 01/15/2015  . Hx of partial nephrectomy 09/20/2011  . CAD (coronary artery disease) 02/17/2011  . Hyperlipidemia 02/17/2011  . Pacemaker-Medtronic 02/17/2011  . Complete heart block ([place]) 11/06/2010  . Hypertension 11/06/2010      Laureen Abrahams, PT, DPT 06/12/20 11:00 AM    [facility] [address] [place], [place], [address] Phone: [phone]   Fax:  [phone]  Name: Robert Ramsey MRN: [id number] Date of Birth: [birth year]     PHYSICAL THERAPY DISCHARGE SUMMARY  Visits from Start of Care: 7  Current functional level related to goals / functional outcomes: See above - pt now scheduled for surgery   Remaining deficits: See above   Education / Equipment: HEP  Plan: Patient agrees to discharge.  Patient goals were partially met. Patient is being discharged due to lack of progress.  ?????    Laureen Abrahams, PT, DPT 08/13/20 10:41 AM  [facility] [address] [place], [place], [address] Phone: [phone]   Fax:  [phone]

## 2020-06-17 ENCOUNTER — Encounter: Payer: Self-pay | Admitting: Orthopedic Surgery

## 2020-06-17 ENCOUNTER — Other Ambulatory Visit: Payer: Self-pay

## 2020-06-17 ENCOUNTER — Ambulatory Visit (INDEPENDENT_AMBULATORY_CARE_PROVIDER_SITE_OTHER): Payer: Medicare Other | Admitting: Orthopedic Surgery

## 2020-06-17 DIAGNOSIS — M7541 Impingement syndrome of right shoulder: Secondary | ICD-10-CM | POA: Diagnosis not present

## 2020-06-17 DIAGNOSIS — I2510 Atherosclerotic heart disease of native coronary artery without angina pectoris: Secondary | ICD-10-CM | POA: Diagnosis not present

## 2020-06-17 NOTE — Progress Notes (Signed)
Office Visit Note   Patient: Robert BERNHARD           Date of Birth: 03/03/1941           MRN: [id number] Visit Date: 06/17/2020              Requested by: Elby Showers, MD [address] [place],  [place] [address] PCP: Elby Showers, MD  Chief Complaint  Patient presents with  . Right Shoulder - Follow-up      HPI: Patient is an 79 year old gentleman who presents in follow-up for rotator cuff pathology right shoulder.  Patient states that he has had no change in his symptoms cannot reach behind himself has pain with reaching in front of himself.  Patient has been attending physical therapy with current outpatient therapy for 6 weeks without improvement in his symptoms.  Patient states the pain is primarily anterior lateral over the deltoid and biceps.  Assessment & Plan: Visit Diagnoses:  1. Impingement syndrome of right shoulder     Plan: Due to failure of conservative treatment including physical therapy and subacromial injection patient states he would like to proceed with arthroscopic intervention.  He does have a pacemaker that has been there for 15 years and would not be a good candidate for an MRI scan.  The soft tissue detail of the CT scan most likely would not provide much additional information.  We will request authorization for outpatient arthroscopic debridement of the rotator cuff and subacromial decompression.  Follow-Up Instructions: Return if symptoms worsen or fail to improve, for Follow-up 1 week after arthroscopic intervention.   Ortho Exam  Patient is alert, oriented, no adenopathy, well-dressed, normal affect, normal respiratory effort. Examination patient has no tenderness to palpation over the [facility] joint.  The [facility] joint is symmetrical bilaterally.  He has abduction flexion to 120 degrees he has pain with Neer and Hawkins impingement test pain with a drop arm test he has pain to palpation over the biceps tendon.  Imaging: No results found. No  images are attached to the encounter.  Labs: Lab Results  Component Value Date   HGBA1C 5.5 03/27/2020   HGBA1C 5.4 03/26/2019   HGBA1C 5.3 12/22/2018   ESRSEDRATE 12 11/06/2015   ESRSEDRATE 42 (H) 08/17/2013   LABURIC 6.7 11/06/2015   LABURIC 6.6 08/17/2013     Lab Results  Component Value Date   ALBUMIN 4.2 10/01/2019   ALBUMIN 4.4 02/26/2019   ALBUMIN 4.4 02/15/2018   PREALBUMIN 22 03/26/2019   PREALBUMIN 20 (L) 01/15/2019   PREALBUMIN 19 (L) 12/22/2018   LABURIC 6.7 11/06/2015   LABURIC 6.6 08/17/2013    No results found for: MG No results found for: VD25OH  Lab Results  Component Value Date   PREALBUMIN 22 03/26/2019   PREALBUMIN 20 (L) 01/15/2019   PREALBUMIN 19 (L) 12/22/2018   CBC EXTENDED Latest Ref Rng & Units 03/27/2020 10/01/2019 03/19/2019  WBC 3.8 - 10.8 Thousand/uL 7.8 5.0 4.4  RBC 4.20 - 5.80 Million/uL 4.29 4.23 4.16(L)  HGB 13.2 - 17.1 g/dL 13.9 13.6 13.3  HCT 38.5 - 50.0 % 41.4 40.6 40.3  PLT 140 - 400 Thousand/uL 261 213 204  NEUTROABS 1,500 - 7,800 cells/uL 5,655 3.4 2,807  LYMPHSABS 850 - 3,900 cells/uL 1,576 0.9 1,038     There is no height or weight on file to calculate BMI.  Orders:  No orders of the defined types were placed in this encounter.  No orders of the defined types  were placed in this encounter.    Procedures: No procedures performed  Clinical Data: No additional findings.  ROS:  All other systems negative, except as noted in the HPI. Review of Systems  Objective: Vital Signs: There were no vitals taken for this visit.  Specialty Comments:  No specialty comments available.  PMFS History: Patient Active Problem List   Diagnosis Date Noted  . Weight loss 12/30/2018  . Memory loss 01/15/2015  . Excessive daytime sleepiness 01/15/2015  . Hx of partial nephrectomy 09/20/2011  . CAD (coronary artery disease) 02/17/2011  . Hyperlipidemia 02/17/2011  . Pacemaker-Medtronic 02/17/2011  . Complete heart block  ([place]) 11/06/2010  . Hypertension 11/06/2010   Past Medical History:  Diagnosis Date  . Abdominal pain   . Abnormal stress test 09/15/10   SEPTAL DEFECT SECONDARY TO PREVIOUS INFARCT VS LBBB; MILD SEPTAL ISCHEMIA  . Acid reflux   . CAD (coronary artery disease)    cath in 2007 reveals mild diffuse CAD  . Colon polyps   . Complete heart block [facility]) May 2011   has PTVP in place  . Diverticulosis   . Glucose intolerance (impaired glucose tolerance)   . Hemorrhoids   . Hiatal hernia   . History of colonoscopy   . HTN (hypertension)   . Hypercholesterolemia   . Memory impairment    better with lower dose statin  . Pacemaker   . PVC's (premature ventricular contractions)   . Sinusitis   . Sleep apnea   . Ventral hernia     Family History  Problem Relation Age of Onset  . Heart failure Father   . Leukemia Mother     Past Surgical History:  Procedure Laterality Date  . APPENDECTOMY    . CARDIAC CATHETERIZATION  07/29/2005   EF 50-55%; Has mild diffuse CAD  . cataract surgery  06/2009, 10/2014  . COLONOSCOPY    . CPAP    . LBBB    . NOSE SURGERY    . PACEMAKER INSERTION  11/18/09   MDT implanted by Dr Doreatha Lew  . REFRACTIVE SURGERY Bilateral   . right nephrectomy  01/2006   partial for mass   Social History   Occupational History  . Occupation: Retired    Comment: [employer]  Tobacco Use  . Smoking status: Former Smoker    Quit date: 07/06/1983    Years since quitting: 36.9  . Smokeless tobacco: Never Used  . Tobacco comment: stopped smoking in 1985  Substance and Sexual Activity  . Alcohol use: No  . Drug use: No  . Sexual activity: Not Currently

## 2020-06-20 ENCOUNTER — Encounter: Payer: Medicare Other | Admitting: Rehabilitative and Restorative Service Providers"

## 2020-06-25 ENCOUNTER — Encounter: Payer: Medicare Other | Admitting: Physical Therapy

## 2020-07-01 DIAGNOSIS — H353221 Exudative age-related macular degeneration, left eye, with active choroidal neovascularization: Secondary | ICD-10-CM | POA: Diagnosis not present

## 2020-07-01 DIAGNOSIS — Z23 Encounter for immunization: Secondary | ICD-10-CM | POA: Diagnosis not present

## 2020-07-02 ENCOUNTER — Telehealth: Payer: Self-pay | Admitting: Orthopedic Surgery

## 2020-07-02 NOTE — Telephone Encounter (Signed)
Patient called stating he would like to move forward with right shoulder surgery. Pt has completed physical therapy and has already discussed surgery. Please call patient about this matter at [phone] cell first and home if not answer at [phone].

## 2020-07-02 NOTE — Telephone Encounter (Signed)
Did Dr. Lajoyce Corners fill out blue sheet for this pt?

## 2020-08-01 DIAGNOSIS — H353221 Exudative age-related macular degeneration, left eye, with active choroidal neovascularization: Secondary | ICD-10-CM | POA: Diagnosis not present

## 2020-08-13 ENCOUNTER — Ambulatory Visit (INDEPENDENT_AMBULATORY_CARE_PROVIDER_SITE_OTHER): Payer: Medicare Other

## 2020-08-13 DIAGNOSIS — I442 Atrioventricular block, complete: Secondary | ICD-10-CM | POA: Diagnosis not present

## 2020-08-15 LAB — CUP PACEART REMOTE DEVICE CHECK
Battery Impedance: 1273 Ohm
Battery Remaining Longevity: 52 mo
Battery Voltage: 2.77 V
Brady Statistic AP VP Percent: 7 %
Brady Statistic AP VS Percent: 0 %
Brady Statistic AS VP Percent: 92 %
Brady Statistic AS VS Percent: 0 %
Date Time Interrogation Session: 20220209084354
Implantable Lead Implant Date: 20110517
Implantable Lead Implant Date: 20110517
Implantable Lead Location: 753859
Implantable Lead Location: 753860
Implantable Lead Model: 4469
Implantable Lead Model: 4470
Implantable Lead Serial Number: 543103
Implantable Lead Serial Number: 672341
Implantable Pulse Generator Implant Date: 20110517
Lead Channel Impedance Value: 608 Ohm
Lead Channel Impedance Value: 684 Ohm
Lead Channel Pacing Threshold Amplitude: 0.375 V
Lead Channel Pacing Threshold Amplitude: 0.5 V
Lead Channel Pacing Threshold Pulse Width: 0.4 ms
Lead Channel Pacing Threshold Pulse Width: 0.4 ms
Lead Channel Setting Pacing Amplitude: 2 V
Lead Channel Setting Pacing Amplitude: 2.5 V
Lead Channel Setting Pacing Pulse Width: 0.4 ms
Lead Channel Setting Sensing Sensitivity: 2 mV

## 2020-08-18 ENCOUNTER — Other Ambulatory Visit: Payer: Self-pay | Admitting: Physician Assistant

## 2020-08-18 ENCOUNTER — Encounter: Payer: Self-pay | Admitting: Internal Medicine

## 2020-08-18 NOTE — Progress Notes (Signed)
Patient has Medtronic Pacemaker  Email sent to device clinic for instructions for day of surgery  Gae Dry, Medtronic Rep, notified of surgery.  Per Ronalee Belts, he does not need to be present on DOS.

## 2020-08-18 NOTE — Progress Notes (Signed)
PERIOPERATIVE PRESCRIPTION FOR IMPLANTED CARDIAC DEVICE PROGRAMMING   Patient Information: Name: Robert Ramsey, Robert Ramsey.  DOB: 01-03-1941  MRN: [id number]   Planned Procedure: Right Shoulder Arthroscopy Debridement Decompression  Surgeon: Meridee Score  Date of Procedure: 08/22/20  Cautery will be used.  Position during surgery: unknown   Please send documentation back to:  [facility] (Fax # [phone])   Blythe Stanford, RN  04/17/2020 2:39 PM       Device Information:   Clinic EP Physician:   Thompson Grayer, MD Device Type:  Pacemaker Manufacturer and Phone #:  Medtronic: [phone] Pacemaker Dependent?:  Yes Date of Last Device Check:  08/13/2020        Normal Device Function?:  Yes     Electrophysiologist's Recommendations:    Have magnet available.  Provide continuous ECG monitoring when magnet is used or reprogramming is to be performed.   Procedure may interfere with device function.  Magnet should be placed over device during procedure.  Per Device Clinic Standing Orders, Drake Leach  08/18/2020 12:32 PM

## 2020-08-18 NOTE — Progress Notes (Addendum)
Surgical Instructions    Your procedure is scheduled on Friday, March 18th.  Report to [facility] Main Entrance "A" at 7:30 A.M., then check in with the Admitting office.  Call this number if you have problems the morning of surgery:  [phone]   If you have any questions prior to your surgery date call [phone]: Open Monday-Friday 8am-4pm    Remember:  Do not eat after midnight the night before your surgery  You may drink clear liquids until 6:30AM the morning of your surgery.   Clear liquids allowed are: Water, Non-Citrus Juices (without pulp), Carbonated Beverages, Clear Tea, Black Coffee Only, and Gatorade  Please finish your pre-surgery Ensure by 6:30AM, the morning of surgery.  Do not sip.  Drink all in one sitting.  Nothing else to drink after you finish the Ensure.    Take these medicines the morning of surgery with A SIP OF WATER  Eye drops - if needed  Famotidine (Pepcid) - if needed  Myrbetriq  Rosuvastatin (Crestor)   Follow your surgeon's instructions on when to stop Aspirin.  If no instructions were given by your surgeon then you will need to call the office to get those instructions.    As of today, STOP taking any Aspirin (unless otherwise instructed by your surgeon) Aleve, Naproxen, Ibuprofen, Motrin, Advil, Goody's, BC's, all herbal medications, fish oil, and all vitamins.                     Do not wear jewelry            Do not wear lotions, powders, colognes, or deodorant.            Men may shave face and neck.            Do not bring valuables to the hospital.            [facility] is not responsible for any belongings or valuables.  Do NOT Smoke (Tobacco/Vaping) or drink Alcohol 24 hours prior to your procedure If you use a CPAP at night, you may bring all equipment for your overnight stay.   Contacts, glasses, dentures or bridgework may not be worn into surgery, please bring cases for these belongings   For patients admitted to the hospital,  discharge time will be determined by your treatment team.   Patients discharged the day of surgery will not be allowed to drive home, and someone needs to stay with them for 24 hours.    Special instructions:   [facility]- Preparing For Surgery  Before surgery, you can play an important role. Because skin is not sterile, your skin needs to be as free of germs as possible. You can reduce the number of germs on your skin by washing with CHG (chlorahexidine gluconate) Soap before surgery.  CHG is an antiseptic cleaner which kills germs and bonds with the skin to continue killing germs even after washing.    Oral Hygiene is also important to reduce your risk of infection.  Remember - BRUSH YOUR TEETH THE MORNING OF SURGERY WITH YOUR REGULAR TOOTHPASTE  Please do not use if you have an allergy to CHG or antibacterial soaps. If your skin becomes reddened/irritated stop using the CHG.  Do not shave (including legs and underarms) for at least 48 hours prior to first CHG shower. It is OK to shave your face.  Please follow these instructions carefully.   1. Shower the NIGHT BEFORE SURGERY and the MORNING OF  SURGERY  2. If you chose to wash your hair, wash your hair first as usual with your normal shampoo.  3. After you shampoo, rinse your hair and body thoroughly to remove the shampoo.  4. Wash Face and genitals (private parts) with your normal soap.   5.  Shower the NIGHT BEFORE SURGERY and the MORNING OF SURGERY with CHG Soap.   6. Use CHG Soap as you would any other liquid soap. You can apply CHG directly to the skin and wash gently with a scrungie or a clean washcloth.   7. Apply the CHG Soap to your body ONLY FROM THE NECK DOWN.  Do not use on open wounds or open sores. Avoid contact with your eyes, ears, mouth and genitals (private parts). Wash Face and genitals (private parts)  with your normal soap.   8. Wash thoroughly, paying special attention to the area where your surgery will be  performed.  9. Thoroughly rinse your body with warm water from the neck down.  10. DO NOT shower/wash with your normal soap after using and rinsing off the CHG Soap.  11. Pat yourself dry with a CLEAN TOWEL.  12. Wear CLEAN PAJAMAS to bed the night before surgery  13. Place CLEAN SHEETS on your bed the night before your surgery  14. DO NOT SLEEP WITH PETS.   Day of Surgery: Wear Clean/Comfortable clothing the morning of surgery Do not apply any deodorants/lotions.   Remember to brush your teeth WITH YOUR REGULAR TOOTHPASTE.   Please read over the following fact sheets that you were given.

## 2020-08-19 ENCOUNTER — Other Ambulatory Visit (HOSPITAL_COMMUNITY)
Admission: RE | Admit: 2020-08-19 | Discharge: 2020-08-19 | Disposition: A | Discharge status: Home / Self Care | Payer: Medicare Other | Source: Ambulatory Visit | Attending: Orthopedic Surgery | Admitting: Orthopedic Surgery

## 2020-08-19 ENCOUNTER — Inpatient Hospital Stay (HOSPITAL_COMMUNITY)
Admission: RE | Admit: 2020-08-19 | Discharge: 2020-08-19 | Disposition: A | Discharge status: Home / Self Care | Payer: Medicare Other | Source: Ambulatory Visit

## 2020-08-19 DIAGNOSIS — Z01812 Encounter for preprocedural laboratory examination: Secondary | ICD-10-CM | POA: Diagnosis not present

## 2020-08-19 DIAGNOSIS — Z20822 Contact with and (suspected) exposure to covid-19: Secondary | ICD-10-CM | POA: Insufficient documentation

## 2020-08-19 LAB — SARS CORONAVIRUS 2 (TAT 6-24 HRS): SARS Coronavirus 2: NEGATIVE

## 2020-08-19 NOTE — Progress Notes (Signed)
Remote pacemaker transmission.   

## 2020-08-19 NOTE — Progress Notes (Signed)
Anesthesia Chart Review:  Case: [id number] Date/Time: 08/22/20 0912   Procedure: RIGHT SHOULDER ARTHROSCOPY, DEBRIDEMENT, AND DECOMPRESSION (Right Shoulder)   Anesthesia type: Choice   Pre-op diagnosis: Impingement Right Shoulder   Location: [facility] OR ROOM 05 / [place] [facility]   Surgeons: Newt Minion, MD      DISCUSSION:  Patient is an 80 year old male scheduled for the above procedure.  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 No worrisome CV symptoms. BP stable. Continue current medications. No new testing ordered at that time. Six month follow-up planned (scheduled 09/01/20 with Dr. Rayann Heman). Patient can continue taking ASA for surgery per Dr. Sharol Given. He denied chest pain, SOB, cough, fever at PAT RN visit.  Perioperative PPM Recommendations: Device Information: Clinic EP Physician:James Allred, MD Device Type:Pacemaker Manufacturer and Phone #:Medtronic: [phone] Pacemaker Dependent?:Yes Date of Last Device Check:2/9/2022Normal Device Function?:Yes  Electrophysiologist's Recommendations:  Have magnet available.  Provide continuous ECG monitoring when magnet is used or reprogramming is to be performed.  Procedure may interfere with device function.  Magnet should be placed over device during procedure.  2nd Moderna COVID-19 vaccine 09/14/19. 08/19/20 preoperative COVID-19 test negative. Anesthesia team to evaluate on the day of surgery.     VS: BP (!) 159/86   Pulse 87   Temp 36.6 C (Oral)   Resp 18   Ht 5\' 9"  (1.753 m)   Wt 84.5 kg   SpO2 100%   BMI 27.51 kg/m     PROVIDERS: Elby Showers, MD is PCP  Thompson Grayer, MD and Truitt Merle, NP are  cardiologist. Last visit 02/18/20.    LABS: Labs reviewed: Acceptable for surgery. A1c 5.5% 03/27/20.  (all labs ordered are listed, but only abnormal results are displayed)  Labs Reviewed  BASIC METABOLIC PANEL  CBC     EKG: 02/18/20: Pace rhythm. Underlying SR with occasional PVCs.    CV: Echo 09/14/10: Impression: - Left ventricle: The cavity size was normal. Wall thickness was increased in a pattern of mild LVH. There was mild focal basal hypertrophy of the septum. Systolic function was normal. The estimated ED 50-55%. Wall motion was normal; there were no regional wall motion abnormalities. Doppler parameters are consistent with abnormal left ventricular relaxation (grade 1 diastolic dysfunction) - Ventricular septum: Septal motion showed paradox. - Aortic valve: Trivial regurgitation. - Mitral valve: Calcified annulus. - Pulmonic valve: Mild regurgitation. - Tricuspid valve: Mild regurgitation. - Pulmonary arteries: Systolic pressure was mildly increased. PA peak pressure 31 mmHg (S).  Nuclear stress test 09/14/10: Overall Impression Exercise Capacity: Adenosine study with no exercise. BP Response: Normal blood pressure response. Clinical Symptoms: No chest pain ECG Impression: Uninterpretable due to LBBB and intermittent ventricular pacing. Overall Impression: Abnormal adenosine nuclear study with septal defect secondary to previous infarct vs LBBB; mild septal ischemia as well.  Cardiac cath 07/30/05:  OVERALL IMPRESSION:  1.  Well preserved global left ventricular function with a low normal      ejection fraction felt to be related to left bundle branch block.  2.  There is mild diffuse nonobstructive three-vessel coronary      atherosclerosis. [LAD with scattered irregularities < 30%. LCX arises in an anomalous distribution for the RCA and is small but normal. Large dominant RCA with luminal irregularities, 30-50%  proximal PDA]   Past Medical History:  Diagnosis Date   . Abdominal pain   . Abnormal stress test 09/15/10   SEPTAL DEFECT SECONDARY TO PREVIOUS INFARCT VS LBBB; MILD SEPTAL ISCHEMIA  . Acid reflux   . Arthritis   . CAD (coronary artery disease)    cath in 2007 reveals mild diffuse CAD  . Colon polyps   . Complete heart block [facility]) May 2011   has PTVP in place  . Diverticulosis   . Glucose intolerance (impaired glucose tolerance)   . Hemorrhoids   . Hiatal hernia   . History of colonoscopy   . HTN (hypertension)   . Hypercholesterolemia   . Memory impairment    better with lower dose statin  . Pacemaker    Medtronic  . PVC's (premature ventricular contractions)   . Sinusitis   . Sleep apnea    CPAP nightly  . Ventral hernia     Past Surgical History:  Procedure Laterality Date  . APPENDECTOMY    . CARDIAC CATHETERIZATION  07/29/2005   EF 50-55%; Has mild diffuse CAD  . cataract surgery  06/2009, 10/2014   Dr. Kathrin Penner  . COLONOSCOPY    . CPAP    . EYE SURGERY    . INSERT / REPLACE / REMOVE PACEMAKER  11/18/2009   w/ Dr. Doreatha Lew  . LBBB    . NOSE SURGERY    . PACEMAKER INSERTION  11/18/09   MDT implanted by Dr Doreatha Lew  . REFRACTIVE SURGERY Bilateral   . right nephrectomy  01/2006   partial for mass  . TONSILLECTOMY      MEDICATIONS: . aspirin 81 MG tablet  . Carboxymethylcellul-Glycerin (LUBRICATING EYE DROPS OP)  . ezetimibe (ZETIA) 10 MG tablet  . famotidine (PEPCID) 20 MG tablet  . losartan (COZAAR) 25 MG tablet  . Multiple Vitamin (MULTIVITAMIN) tablet  . Multiple Vitamins-Minerals (PRESERVISION AREDS 2 PO)  . MYRBETRIQ 50 MG TB24 tablet  . rosuvastatin (CRESTOR) 5 MG tablet  . sertraline (ZOLOFT) 25 MG tablet   No current facility-administered medications for this encounter.    Myra Gianotti, PA-C Surgical Short Stay/Anesthesiology [facility] Phone [phone] [facility] Phone [phone] 08/20/2020 4:40 PM

## 2020-08-20 ENCOUNTER — Encounter (HOSPITAL_COMMUNITY)
Admission: RE | Admit: 2020-08-20 | Discharge: 2020-08-20 | Disposition: A | Discharge status: Home / Self Care | Payer: Medicare Other | Source: Ambulatory Visit | Attending: Orthopedic Surgery | Admitting: Orthopedic Surgery

## 2020-08-20 ENCOUNTER — Other Ambulatory Visit: Payer: Self-pay

## 2020-08-20 ENCOUNTER — Telehealth: Payer: Self-pay | Admitting: Nurse Practitioner

## 2020-08-20 ENCOUNTER — Encounter (HOSPITAL_COMMUNITY): Payer: Self-pay

## 2020-08-20 DIAGNOSIS — G4733 Obstructive sleep apnea (adult) (pediatric): Secondary | ICD-10-CM | POA: Diagnosis not present

## 2020-08-20 DIAGNOSIS — Z950 Presence of cardiac pacemaker: Secondary | ICD-10-CM | POA: Insufficient documentation

## 2020-08-20 DIAGNOSIS — I10 Essential (primary) hypertension: Secondary | ICD-10-CM | POA: Diagnosis not present

## 2020-08-20 DIAGNOSIS — Z9989 Dependence on other enabling machines and devices: Secondary | ICD-10-CM | POA: Insufficient documentation

## 2020-08-20 DIAGNOSIS — E7800 Pure hypercholesterolemia, unspecified: Secondary | ICD-10-CM | POA: Insufficient documentation

## 2020-08-20 DIAGNOSIS — Z01812 Encounter for preprocedural laboratory examination: Secondary | ICD-10-CM | POA: Insufficient documentation

## 2020-08-20 DIAGNOSIS — Z79899 Other long term (current) drug therapy: Secondary | ICD-10-CM | POA: Insufficient documentation

## 2020-08-20 DIAGNOSIS — Z86010 Personal history of colonic polyps: Secondary | ICD-10-CM | POA: Insufficient documentation

## 2020-08-20 DIAGNOSIS — K219 Gastro-esophageal reflux disease without esophagitis: Secondary | ICD-10-CM | POA: Insufficient documentation

## 2020-08-20 DIAGNOSIS — I2510 Atherosclerotic heart disease of native coronary artery without angina pectoris: Secondary | ICD-10-CM | POA: Insufficient documentation

## 2020-08-20 DIAGNOSIS — I447 Left bundle-branch block, unspecified: Secondary | ICD-10-CM | POA: Insufficient documentation

## 2020-08-20 DIAGNOSIS — Z7982 Long term (current) use of aspirin: Secondary | ICD-10-CM | POA: Diagnosis not present

## 2020-08-20 DIAGNOSIS — R7302 Impaired glucose tolerance (oral): Secondary | ICD-10-CM | POA: Diagnosis not present

## 2020-08-20 HISTORY — DX: Unspecified osteoarthritis, unspecified site: M19.90

## 2020-08-20 LAB — CBC
HCT: 42.2 % (ref 39.0–52.0)
Hemoglobin: 14 g/dL (ref 13.0–17.0)
MCH: 32.3 pg (ref 26.0–34.0)
MCHC: 33.2 g/dL (ref 30.0–36.0)
MCV: 97.2 fL (ref 80.0–100.0)
Platelets: 253 10*3/uL (ref 150–400)
RBC: 4.34 MIL/uL (ref 4.22–5.81)
RDW: 13.9 % (ref 11.5–15.5)
WBC: 6.3 10*3/uL (ref 4.0–10.5)
nRBC: 0 % (ref 0.0–0.2)

## 2020-08-20 LAB — BASIC METABOLIC PANEL
Anion gap: 9 (ref 5–15)
BUN: 22 mg/dL (ref 8–23)
CO2: 24 mmol/L (ref 22–32)
Calcium: 10.3 mg/dL (ref 8.9–10.3)
Chloride: 108 mmol/L (ref 98–111)
Creatinine, Ser: 1.02 mg/dL (ref 0.61–1.24)
GFR, Estimated: >60 mL/min (ref >60)
Glucose, Bld: 75 mg/dL (ref 70–99)
Potassium: 5 mmol/L (ref 3.5–5.1)
Sodium: 141 mmol/L (ref 135–145)

## 2020-08-20 NOTE — Telephone Encounter (Signed)
Pt c/o medication issue:  1. Name of Medication:   aspirin 81 MG tablet    2. How are you currently taking this medication (dosage and times per day)? 1 tablet daily  3. Are you having a reaction (difficulty breathing--STAT)? no  4. What is your medication issue? Patient states he is having a shoulder surgery Friday morning and would like to know if it is okay for him to hold his Asprin per his surgeon. He states they are not requiring clearance. He states they told him to hold the medication and he just wants to make sure this is okay.

## 2020-08-20 NOTE — Progress Notes (Signed)
PCP - Tedra Senegal, MD Cardiologist - Thompson Grayer, MD Elecrophysiologist- Thompson Grayer, MD  PPM/ICD - Medtronic Device Orders - Received 08/18/20 Rep Notified - Yes, 08/19/20; Per Gae Dry, Medtronic rep, he does not need to be present DOS.  Chest x-ray - N/A EKG - 02/18/20 Stress Test - 09/14/10 ECHO - 09/14/10 Cardiac Cath - 07/30/05  Sleep Study - Yes, positive for OSA CPAP - Yes, nightly.  Patient denies being diabetic.  Blood Thinner Instructions: N/A Aspirin Instructions: Instructed pt to contact Dr. Jess Barters office for instructions.  ERAS Protcol - Yes PRE-SURGERY Ensure or G2- Ensure given  COVID TEST- 08/19/20, negative. Patient wearing a mask at appointment and reminded about quarantine instructions.   Anesthesia review: Yes, cardiac hx.  Patient denies shortness of breath, fever, cough and chest pain at PAT appointment   All instructions explained to the patient, with a verbal understanding of the material. Patient agrees to go over the instructions while at home for a better understanding. Patient also instructed to self quarantine after being tested for COVID-19. The opportunity to ask questions was provided.

## 2020-08-20 NOTE — Anesthesia Preprocedure Evaluation (Addendum)
Anesthesia Evaluation  Patient identified by MRN, date of birth, ID band Patient awake    Reviewed: Allergy & Precautions, NPO status , Patient's Chart, lab work & pertinent test results  Airway Mallampati: II  TM Distance: >3 FB Neck ROM: Full    Dental no notable dental hx.    Pulmonary sleep apnea and Continuous Positive Airway Pressure Ventilation , former smoker,    Pulmonary exam normal breath sounds clear to auscultation       Cardiovascular hypertension, Normal cardiovascular exam+ dysrhythmias + pacemaker (due to complete heart block)  Rhythm:Regular Rate:Normal     Neuro/Psych negative neurological ROS  negative psych ROS   GI/Hepatic Neg liver ROS, hiatal hernia, GERD  ,  Endo/Other  negative endocrine ROS  Renal/GU negative Renal ROS  negative genitourinary   Musculoskeletal  (+) Arthritis , Osteoarthritis,    Abdominal   Peds  Hematology negative hematology ROS (+)   Anesthesia Other Findings   Reproductive/Obstetrics                            Anesthesia Physical Anesthesia Plan  ASA: IV  Anesthesia Plan: Regional and General   Post-op Pain Management: GA combined w/ Regional for post-op pain   Induction: Intravenous  PONV Risk Score and Plan: 2 and Ondansetron, Dexamethasone and Treatment may vary due to age or medical condition  Airway Management Planned: Oral ETT  Additional Equipment: None  Intra-op Plan:   Post-operative Plan: Extubation in OR  Informed Consent: I have reviewed the patients History and Physical, chart, labs and discussed the procedure including the risks, benefits and alternatives for the proposed anesthesia with the patient or authorized representative who has indicated his/her understanding and acceptance.     Dental advisory given  Plan Discussed with: Anesthesiologist and CRNA  Anesthesia Plan Comments: (Interscalene block with  Exparel. Pacemaker will need to be reprogrammed given patient positioning. Norton Blizzard, MD  Contacted Medtronic rep, [place]. He stated he is unavailable to assist [place] with this patient for several hours. Advised [place] to place a magnet and large tegaderm perioperatively for surgery. The preference for re-programming was reiterated. Will plan to place magnet given is unavailability. Norton Blizzard, MD    )     Anesthesia Quick Evaluation

## 2020-08-20 NOTE — Telephone Encounter (Signed)
I s/w Cheryl with Dr. Jess Barters office in regards that the pt just called our office and was asking if ok to hold his ASA per surgeon request. I did not see any clearance request. Per Malachy Mood, Dr. Sharol Given did not tell the pt that he needs to hold his ASA as well they are not needing any cardiac clearance. They did get clearance for pt's device. Malachy Mood and I are in agreement that Malachy Mood will call the pt and make sure he knows to keep taking his ASA, no need to stop ASA. I will fax these notes to [facility].

## 2020-08-22 ENCOUNTER — Ambulatory Visit (HOSPITAL_COMMUNITY): Payer: Medicare Other | Admitting: Vascular Surgery

## 2020-08-22 ENCOUNTER — Other Ambulatory Visit: Payer: Self-pay

## 2020-08-22 ENCOUNTER — Ambulatory Visit (HOSPITAL_COMMUNITY): Payer: Medicare Other | Admitting: Certified Registered Nurse Anesthetist

## 2020-08-22 ENCOUNTER — Ambulatory Visit (HOSPITAL_COMMUNITY)
Admission: RE | Admit: 2020-08-22 | Discharge: 2020-08-22 | Disposition: A | Discharge status: Home / Self Care | Payer: Medicare Other | Attending: Orthopedic Surgery | Admitting: Orthopedic Surgery

## 2020-08-22 ENCOUNTER — Encounter (HOSPITAL_COMMUNITY): Payer: Self-pay | Admitting: Orthopedic Surgery

## 2020-08-22 ENCOUNTER — Encounter (HOSPITAL_COMMUNITY)
Admission: RE | Disposition: A | Discharge status: Home / Self Care | Payer: Self-pay | Source: Home / Self Care | Attending: Orthopedic Surgery

## 2020-08-22 DIAGNOSIS — M75121 Complete rotator cuff tear or rupture of right shoulder, not specified as traumatic: Secondary | ICD-10-CM

## 2020-08-22 DIAGNOSIS — M7501 Adhesive capsulitis of right shoulder: Secondary | ICD-10-CM | POA: Diagnosis not present

## 2020-08-22 DIAGNOSIS — S46111A Strain of muscle, fascia and tendon of long head of biceps, right arm, initial encounter: Secondary | ICD-10-CM | POA: Insufficient documentation

## 2020-08-22 DIAGNOSIS — X58XXXA Exposure to other specified factors, initial encounter: Secondary | ICD-10-CM | POA: Diagnosis not present

## 2020-08-22 DIAGNOSIS — S43431A Superior glenoid labrum lesion of right shoulder, initial encounter: Secondary | ICD-10-CM | POA: Insufficient documentation

## 2020-08-22 DIAGNOSIS — M7551 Bursitis of right shoulder: Secondary | ICD-10-CM | POA: Insufficient documentation

## 2020-08-22 DIAGNOSIS — Z950 Presence of cardiac pacemaker: Secondary | ICD-10-CM | POA: Insufficient documentation

## 2020-08-22 DIAGNOSIS — Z905 Acquired absence of kidney: Secondary | ICD-10-CM | POA: Diagnosis not present

## 2020-08-22 DIAGNOSIS — Z87891 Personal history of nicotine dependence: Secondary | ICD-10-CM | POA: Diagnosis not present

## 2020-08-22 DIAGNOSIS — I10 Essential (primary) hypertension: Secondary | ICD-10-CM | POA: Diagnosis not present

## 2020-08-22 DIAGNOSIS — I2510 Atherosclerotic heart disease of native coronary artery without angina pectoris: Secondary | ICD-10-CM | POA: Diagnosis not present

## 2020-08-22 DIAGNOSIS — M7541 Impingement syndrome of right shoulder: Secondary | ICD-10-CM | POA: Diagnosis not present

## 2020-08-22 DIAGNOSIS — G8918 Other acute postprocedural pain: Secondary | ICD-10-CM | POA: Diagnosis not present

## 2020-08-22 HISTORY — PX: SHOULDER ARTHROSCOPY: SHX128

## 2020-08-22 SURGERY — ARTHROSCOPY, SHOULDER
Anesthesia: Regional | Site: Shoulder | Laterality: Right

## 2020-08-22 MED ORDER — FENTANYL CITRATE (PF) 250 MCG/5ML IJ SOLN
INTRAMUSCULAR | Status: AC
  Filled 2020-08-22: qty 5

## 2020-08-22 MED ORDER — SUGAMMADEX SODIUM 200 MG/2ML IV SOLN
INTRAVENOUS | Status: DC | PRN
  Administered 2020-08-22: 200 mg via INTRAVENOUS

## 2020-08-22 MED ORDER — ONDANSETRON HCL 4 MG/2ML IJ SOLN: 4.0000 mg | Freq: Once | INTRAMUSCULAR | Status: DC | PRN

## 2020-08-22 MED ORDER — LIDOCAINE 2% (20 MG/ML) 5 ML SYRINGE
INTRAMUSCULAR | Status: AC
  Filled 2020-08-22: qty 5

## 2020-08-22 MED ORDER — ONDANSETRON HCL 4 MG/2ML IJ SOLN
INTRAMUSCULAR | Status: AC
  Filled 2020-08-22: qty 2

## 2020-08-22 MED ORDER — CHLORHEXIDINE GLUCONATE 0.12 % MT SOLN
15.0000 mL | Freq: Once | OROMUCOSAL | Status: AC
  Administered 2020-08-22: 15 mL via OROMUCOSAL
  Filled 2020-08-22: qty 15

## 2020-08-22 MED ORDER — ROPIVACAINE HCL 5 MG/ML IJ SOLN: INTRAMUSCULAR | Status: DC | PRN

## 2020-08-22 MED ORDER — BUPIVACAINE LIPOSOME 1.3 % IJ SUSP
INTRAMUSCULAR | Status: DC | PRN
  Administered 2020-08-22: 10 mL

## 2020-08-22 MED ORDER — DEXAMETHASONE SODIUM PHOSPHATE 4 MG/ML IJ SOLN
INTRAMUSCULAR | Status: DC | PRN
  Administered 2020-08-22: 4 mg via INTRAVENOUS

## 2020-08-22 MED ORDER — PHENYLEPHRINE HCL (PRESSORS) 10 MG/ML IV SOLN
INTRAVENOUS | Status: DC | PRN
  Administered 2020-08-22: 120 ug via INTRAVENOUS
  Administered 2020-08-22: 80 ug via INTRAVENOUS

## 2020-08-22 MED ORDER — ONDANSETRON HCL 4 MG/2ML IJ SOLN
INTRAMUSCULAR | Status: DC | PRN
  Administered 2020-08-22: 4 mg via INTRAVENOUS

## 2020-08-22 MED ORDER — ORAL CARE MOUTH RINSE: 15.0000 mL | Freq: Once | OROMUCOSAL | Status: AC

## 2020-08-22 MED ORDER — LACTATED RINGERS IV SOLN: INTRAVENOUS | Status: DC

## 2020-08-22 MED ORDER — FENTANYL CITRATE (PF) 100 MCG/2ML IJ SOLN
INTRAMUSCULAR | Status: DC | PRN
  Administered 2020-08-22: 50 ug via INTRAVENOUS

## 2020-08-22 MED ORDER — CLINDAMYCIN PHOSPHATE 900 MG/50ML IV SOLN
900.0000 mg | Freq: Once | INTRAVENOUS | Status: AC
  Administered 2020-08-22: 900 mg via INTRAVENOUS

## 2020-08-22 MED ORDER — DEXAMETHASONE SODIUM PHOSPHATE 10 MG/ML IJ SOLN
INTRAMUSCULAR | Status: AC
  Filled 2020-08-22: qty 1

## 2020-08-22 MED ORDER — OXYCODONE-ACETAMINOPHEN 5-325 MG PO TABS: 1.0000 | ORAL_TABLET | ORAL | 0 refills | Status: AC | PRN

## 2020-08-22 MED ORDER — FENTANYL CITRATE (PF) 100 MCG/2ML IJ SOLN: 50.0000 ug | Freq: Once | INTRAMUSCULAR | Status: AC

## 2020-08-22 MED ORDER — PROPOFOL 10 MG/ML IV BOLUS
INTRAVENOUS | Status: DC | PRN
  Administered 2020-08-22: 150 mg via INTRAVENOUS

## 2020-08-22 MED ORDER — AMISULPRIDE (ANTIEMETIC) 5 MG/2ML IV SOLN: 10.0000 mg | Freq: Once | INTRAVENOUS | Status: DC | PRN

## 2020-08-22 MED ORDER — BUPIVACAINE HCL (PF) 0.25 % IJ SOLN
INTRAMUSCULAR | Status: DC | PRN
  Administered 2020-08-22: 20 mL via EPIDURAL

## 2020-08-22 MED ORDER — EPHEDRINE 5 MG/ML INJ
INTRAVENOUS | Status: AC
  Filled 2020-08-22: qty 10

## 2020-08-22 MED ORDER — PROPOFOL 10 MG/ML IV BOLUS
INTRAVENOUS | Status: AC
  Filled 2020-08-22: qty 20

## 2020-08-22 MED ORDER — ACETAMINOPHEN 500 MG PO TABS
1000.0000 mg | ORAL_TABLET | Freq: Once | ORAL | Status: AC
  Administered 2020-08-22: 1000 mg via ORAL
  Filled 2020-08-22: qty 2

## 2020-08-22 MED ORDER — CLINDAMYCIN PHOSPHATE 900 MG/50ML IV SOLN
INTRAVENOUS | Status: AC
  Filled 2020-08-22: qty 50

## 2020-08-22 MED ORDER — LIDOCAINE 2% (20 MG/ML) 5 ML SYRINGE
INTRAMUSCULAR | Status: DC | PRN
  Administered 2020-08-22: 80 mg via INTRAVENOUS

## 2020-08-22 MED ORDER — FENTANYL CITRATE (PF) 100 MCG/2ML IJ SOLN
INTRAMUSCULAR | Status: AC
  Administered 2020-08-22: 50 ug via INTRAVENOUS
  Filled 2020-08-22: qty 2

## 2020-08-22 MED ORDER — FENTANYL CITRATE (PF) 100 MCG/2ML IJ SOLN: 25.0000 ug | INTRAMUSCULAR | Status: DC | PRN

## 2020-08-22 MED ORDER — MIDAZOLAM HCL 2 MG/2ML IJ SOLN
INTRAMUSCULAR | Status: AC
  Filled 2020-08-22: qty 2

## 2020-08-22 MED ORDER — 0.9 % SODIUM CHLORIDE (POUR BTL) OPTIME
TOPICAL | Status: DC | PRN
  Administered 2020-08-22: 1000 mL

## 2020-08-22 MED ORDER — SODIUM CHLORIDE 0.9 % IR SOLN
Status: DC | PRN
  Administered 2020-08-22 (×2): 3000 mL

## 2020-08-22 MED ORDER — ROCURONIUM BROMIDE 10 MG/ML (PF) SYRINGE
PREFILLED_SYRINGE | INTRAVENOUS | Status: DC | PRN
  Administered 2020-08-22: 60 mg via INTRAVENOUS

## 2020-08-22 MED ORDER — DEXMEDETOMIDINE (PRECEDEX) IN NS 20 MCG/5ML (4 MCG/ML) IV SYRINGE
PREFILLED_SYRINGE | INTRAVENOUS | Status: AC
  Filled 2020-08-22: qty 10

## 2020-08-22 SURGICAL SUPPLY — 36 items
BLADE EXCALIBUR 4.0X13 (MISCELLANEOUS) ×1 IMPLANT
BUR OVAL 6.0 (BURR) ×2 IMPLANT
BURR OVAL 8 FLU 4.0X13 (MISCELLANEOUS) ×2 IMPLANT
CANNULA SHOULDER 7CM (CANNULA) ×2 IMPLANT
COVER SURGICAL LIGHT HANDLE (MISCELLANEOUS) ×4 IMPLANT
COVER WAND RF STERILE (DRAPES) ×2 IMPLANT
DRAPE STERI 35X30 U-POUCH (DRAPES) ×2 IMPLANT
DRAPE U-SHAPE 47X51 STRL (DRAPES) ×2 IMPLANT
DRSG ADAPTIC 3X8 NADH LF (GAUZE/BANDAGES/DRESSINGS) ×1 IMPLANT
DRSG EMULSION OIL 3X3 NADH (GAUZE/BANDAGES/DRESSINGS) ×2 IMPLANT
DRSG PAD ABDOMINAL 8X10 ST (GAUZE/BANDAGES/DRESSINGS) ×4 IMPLANT
DRSG TEGADERM 4X4.5 CHG (GAUZE/BANDAGES/DRESSINGS) ×6 IMPLANT
DURAPREP 26ML APPLICATOR (WOUND CARE) ×2 IMPLANT
GAUZE SPONGE 4X4 12PLY STRL (GAUZE/BANDAGES/DRESSINGS) ×2 IMPLANT
GAUZE SPONGE 4X4 12PLY STRL LF (GAUZE/BANDAGES/DRESSINGS) ×1 IMPLANT
GLOVE BIOGEL PI IND STRL 9 (GLOVE) ×1 IMPLANT
GLOVE BIOGEL PI INDICATOR 9 (GLOVE) ×1
GLOVE SURG ORTHO 9.0 STRL STRW (GLOVE) ×2 IMPLANT
GOWN STRL REUS W/ TWL XL LVL3 (GOWN DISPOSABLE) ×2 IMPLANT
GOWN STRL REUS W/TWL XL LVL3 (GOWN DISPOSABLE) ×4
KIT BASIN OR (CUSTOM PROCEDURE TRAY) ×2 IMPLANT
KIT TURNOVER KIT B (KITS) ×2 IMPLANT
MANIFOLD NEPTUNE II (INSTRUMENTS) ×2 IMPLANT
NDL SPNL 18GX3.5 QUINCKE PK (NEEDLE) ×1 IMPLANT
NEEDLE SPNL 18GX3.5 QUINCKE PK (NEEDLE) ×2 IMPLANT
NS IRRIG 1000ML POUR BTL (IV SOLUTION) ×2 IMPLANT
PACK SHOULDER (CUSTOM PROCEDURE TRAY) ×2 IMPLANT
PAD ARMBOARD 7.5X6 YLW CONV (MISCELLANEOUS) ×4 IMPLANT
PORT APPOLLO RF 90DEGREE MULTI (SURGICAL WAND) IMPLANT
SLING ARM IMMOBILIZER XL (CAST SUPPLIES) ×2 IMPLANT
SUT ETHILON 2 0 FS 18 (SUTURE) ×2 IMPLANT
SUT ETHILON 2 0 PSLX (SUTURE) ×1 IMPLANT
TAPE CLOTH SURG 6X10 WHT LF (GAUZE/BANDAGES/DRESSINGS) ×1 IMPLANT
TOWEL GREEN STERILE FF (TOWEL DISPOSABLE) ×4 IMPLANT
TUBING ARTHROSCOPY IRRIG 16FT (MISCELLANEOUS) ×2 IMPLANT
WATER STERILE IRR 1000ML POUR (IV SOLUTION) ×2 IMPLANT

## 2020-08-22 NOTE — Addendum Note (Signed)
Addendum  created 08/22/20 1145 by Georgia Duff, CRNA   Attestation recorded in [place], [place] filed, Intraprocedure Event edited, Dance movement psychotherapist edited

## 2020-08-22 NOTE — Anesthesia Procedure Notes (Signed)
Anesthesia Regional Block: Interscalene brachial plexus block   Pre-Anesthetic Checklist: ,, timeout performed, Correct Patient, Correct Site, Correct Laterality, Correct Procedure, Correct Position, site marked, Risks and benefits discussed,  Surgical consent,  Pre-op evaluation,  At surgeon's request and post-op pain management  Laterality: Right  Prep: chloraprep       Needles:  Injection technique: Single-shot  Needle Type: Echogenic Stimulator Needle     Needle Length: 10cm  Needle Gauge: 21     Additional Needles:   Procedures:,,,, ultrasound used (permanent image in chart),,,,  Narrative:  Start time: 08/22/2020 8:50 AM End time: 08/22/2020 8:55 AM Injection made incrementally with aspirations every 5 mL.  Performed by: Personally  Anesthesiologist: Merlinda Frederick, MD  Additional Notes: Functioning IV was confirmed and monitors applied.  A 35mm 22ga echogenic arrow stimulator was used. Sterile prep and drape,hand hygiene and sterile gloves were used.Ultrasound guidance: relevant anatomy identified, needle position confirmed, local anesthetic spread visualized around nerve(s)., vascular puncture avoided.  Image printed for medical record.  Negative aspiration and negative test dose prior to incremental administration of local anesthetic. The patient tolerated the procedure well.

## 2020-08-22 NOTE — H&P (Signed)
Robert Ramsey is an 80 y.o. male.   Chief Complaint: Right Shoulder Pain HPI: Patient is an 80 year old gentleman who presents in follow-up for rotator cuff pathology right shoulder.  Patient states that he has had no change in his symptoms cannot reach behind himself has pain with reaching in front of himself.  Patient has been attending physical therapy with current outpatient therapy for 6 weeks without improvement in his symptoms.  Patient states the pain is primarily anterior lateral over the deltoid and biceps.  Past Medical History:  Diagnosis Date  . Abdominal pain   . Abnormal stress test 09/15/10   SEPTAL DEFECT SECONDARY TO PREVIOUS INFARCT VS LBBB; MILD SEPTAL ISCHEMIA  . Acid reflux   . Arthritis   . CAD (coronary artery disease)    cath in 2007 reveals mild diffuse CAD  . Colon polyps   . Complete heart block [facility]) May 2011   has PTVP in place  . Diverticulosis   . Glucose intolerance (impaired glucose tolerance)   . Hemorrhoids   . Hiatal hernia   . History of colonoscopy   . HTN (hypertension)   . Hypercholesterolemia   . Memory impairment    better with lower dose statin  . Pacemaker    Medtronic  . PVC's (premature ventricular contractions)   . Sinusitis   . Sleep apnea    CPAP nightly  . Ventral hernia     Past Surgical History:  Procedure Laterality Date  . APPENDECTOMY    . CARDIAC CATHETERIZATION  07/29/2005   EF 50-55%; Has mild diffuse CAD  . cataract surgery  06/2009, 10/2014   Dr. Kathrin Penner  . COLONOSCOPY    . CPAP    . EYE SURGERY    . INSERT / REPLACE / REMOVE PACEMAKER  11/18/2009   w/ Dr. Doreatha Lew  . LBBB    . NOSE SURGERY    . PACEMAKER INSERTION  11/18/09   MDT implanted by Dr Doreatha Lew  . REFRACTIVE SURGERY Bilateral   . right nephrectomy  01/2006   partial for mass  . TONSILLECTOMY      Family History  Problem Relation Age of Onset  . Heart failure Father   . Leukemia Mother    Social History:  reports that he quit smoking  about 37 years ago. He has never used smokeless tobacco. He reports that he does not drink alcohol and does not use drugs.  Allergies:  Allergies  Allergen Reactions  . Acyclovir And Related     Unknown reaction  . Cinnamon     Cinnamon toothpaste caused him to break out inside his mouth and lesions were removed  . Flexeril [Cyclobenzaprine Hcl]     Possible heart side effects  . Imodium [Loperamide Hcl]     unknown  . Levofloxacin Other (See Comments)    Elevated heart rate  . Nabumetone     Heart issues/hospital admission  . Naproxen     Effects blood pressure and heart  . Nsaids     Effects blood pressure and heart  . Vantin     Pseudo colitis  . Vioxx [Rofecoxib]     Effects blood pressure and heart    No medications prior to admission.    Results for orders placed or performed during the hospital encounter of 08/20/20 (from the past 48 hour(s))  Basic metabolic panel per protocol     Status: None   Collection Time: 08/20/20 11:43 AM  Result Value Ref Range  Sodium 141 135 - 145 mmol/L   Potassium 5.0 3.5 - 5.1 mmol/L   Chloride 108 98 - 111 mmol/L   CO2 24 22 - 32 mmol/L   Glucose, Bld 75 70 - 99 mg/dL    Comment: Glucose reference range applies only to samples taken after fasting for at least 8 hours.   BUN 22 8 - 23 mg/dL   Creatinine, Ser 1.02 0.61 - 1.24 mg/dL   Calcium 10.3 8.9 - 10.3 mg/dL   GFR, Estimated >60 >60 mL/min    Comment: (NOTE) Calculated using the CKD-EPI Creatinine Equation (2021)    Anion gap 9 5 - 15    Comment: Performed at [facility] [address] [place], [place] [address]  CBC per protocol     Status: None   Collection Time: 08/20/20 11:43 AM  Result Value Ref Range   WBC 6.3 4.0 - 10.5 K/uL   RBC 4.34 4.22 - 5.81 MIL/uL   Hemoglobin 14.0 13.0 - 17.0 g/dL   HCT 42.2 39.0 - 52.0 %   MCV 97.2 80.0 - 100.0 fL   MCH 32.3 26.0 - 34.0 pg   MCHC 33.2 30.0 - 36.0 g/dL   RDW 13.9 11.5 - 15.5 %   Platelets 253 150 - 400 K/uL    nRBC 0.0 0.0 - 0.2 %    Comment: Performed at [facility], [place] [address], [place], [place] [address]   No results found.  Review of Systems  All other systems reviewed and are negative.   There were no vitals taken for this visit. Physical Exam  Patient is alert, oriented, no adenopathy, well-dressed, normal affect, normal respiratory effort. Examination patient has no tenderness to palpation over the [facility] joint.  The [facility] joint is symmetrical bilaterally.  He has abduction flexion to 120 degrees he has pain with Neer and Hawkins impingement test pain with a drop arm test he has pain to palpation over the biceps tendon.Heart RRR Lungs Clear Assessment/Plan 1. Impingement syndrome of right shoulder     Plan: Due to failure of conservative treatment including physical therapy and subacromial injection patient states he would like to proceed with arthroscopic intervention.  He does have a pacemaker that has been there for 15 years and would not be a good candidate for an MRI scan.  The soft tissue detail of the CT scan most likely would not provide much additional information.  We will request authorization for outpatient arthroscopic debridement of the rotator cuff and subacromial decompression.   Bevely Palmer Japheth Diekman, PA 08/22/2020, 6:38 AM

## 2020-08-22 NOTE — Interval H&P Note (Signed)
History and Physical Interval Note:  08/22/2020 9:17 AM  Robert Ramsey  has presented today for surgery, with the diagnosis of Impingement Right Shoulder.  The various methods of treatment have been discussed with the patient and family. After consideration of risks, benefits and other options for treatment, the patient has consented to  Procedure(s): RIGHT SHOULDER ARTHROSCOPY, DEBRIDEMENT, AND DECOMPRESSION (Right) as a surgical intervention.  The patient's history has been reviewed, patient examined, no change in status, stable for surgery.  I have reviewed the patient's chart and labs.  Questions were answered to the patient's satisfaction.     Newt Minion

## 2020-08-22 NOTE — Progress Notes (Signed)
Gae Dry medtronic rep called per Dr. Elgie Congo. Needed to communicate that Dr. Elgie Congo wanted the pacemaker reprogrammed d/t pt position in surgery. Per Gae Dry, magnet with large tegaderm was more than sufficient for this procedure and pt did not need to be reprogrammed. Dr. Elgie Congo was relayed this information. No further updates.

## 2020-08-22 NOTE — Anesthesia Procedure Notes (Signed)
Procedure Name: Intubation Date/Time: 08/22/2020 9:53 AM Performed by: Georgia Duff, CRNA Pre-anesthesia Checklist: Patient identified, Emergency Drugs available, Suction available and Patient being monitored Patient Re-evaluated:Patient Re-evaluated prior to induction Oxygen Delivery Method: Circle System Utilized Preoxygenation: Pre-oxygenation with 100% oxygen Induction Type: IV induction Ventilation: Mask ventilation without difficulty Laryngoscope Size: Miller and 2 Grade View: Grade I Tube type: Oral Tube size: 7.5 mm Number of attempts: 1 Airway Equipment and Method: Stylet and Oral airway Placement Confirmation: ETT inserted through vocal cords under direct vision,  positive ETCO2 and breath sounds checked- equal and bilateral Secured at: 21 cm Tube secured with: Tape Dental Injury: Teeth and Oropharynx as per pre-operative assessment

## 2020-08-22 NOTE — Anesthesia Postprocedure Evaluation (Signed)
Anesthesia Post Note  Patient: DOY TAAFFE  Procedure(s) Performed: RIGHT SHOULDER ARTHROSCOPY, DEBRIDEMENT, AND DECOMPRESSION (Right Shoulder)     Patient location during evaluation: PACU Anesthesia Type: Regional and General Level of consciousness: awake Pain management: pain level controlled Vital Signs Assessment: post-procedure vital signs reviewed and stable Respiratory status: spontaneous breathing and respiratory function stable Cardiovascular status: stable Postop Assessment: no apparent nausea or vomiting Anesthetic complications: no   No complications documented.  Last Vitals:  Vitals:   08/22/20 1104 08/22/20 1119  BP: 111/71 125/64  Pulse: 60 62  Resp: 19 13  Temp:  (!) 36.4 C  SpO2: 97% 100%    Last Pain:  Vitals:   08/22/20 1119  TempSrc:   PainSc: 0-No pain                 Merlinda Frederick

## 2020-08-22 NOTE — Op Note (Signed)
08/22/2020  10:40 AM  PATIENT:  Robert Ramsey    PRE-OPERATIVE DIAGNOSIS:  Impingement Right Shoulder  POST-OPERATIVE DIAGNOSIS:  Same  PROCEDURE:  RIGHT SHOULDER ARTHROSCOPY, DEBRIDEMENT, AND DECOMPRESSION  SURGEON:  Newt Minion, MD  PHYSICIAN ASSISTANT:None ANESTHESIA:   General  PREOPERATIVE INDICATIONS:  Robert Ramsey is a  80 y.o. male with a diagnosis of Impingement Right Shoulder who failed conservative measures and elected for surgical management.    The risks benefits and alternatives were discussed with the patient preoperatively including but not limited to the risks of infection, bleeding, nerve injury, cardiopulmonary complications, the need for revision surgery, among others, and the patient was willing to proceed.  OPERATIVE IMPLANTS: none  @ENCIMAGES @  OPERATIVE FINDINGS: Patient had a small full-thickness rotator cuff tear partial tearing of the biceps tendon adhesions to the subscapularis and a grade 1 SLAP lesion.  OPERATIVE PROCEDURE: Patient was brought the operating room after undergoing a interscalene block.  He then underwent a general anesthetic.  After adequate levels anesthesia obtained patient was placed in the beachchair position and the right upper extremity was prepped using DuraPrep draped into a sterile field a timeout was called.  The scope was inserted through the posterior portal and anterior portal was established an outside in technique with an 18-gauge spinal.  Visualization patient had adhesions to the subscapularis he underwent manipulation under anesthesia to break up these adhesions and the electrical wand was used to further debride the adhesive scar tissue.  There is a grade 1 SLAP lesion which was also debrided and partial tearing of the biceps tendon which was debrided as well.  There was a small full-thickness tear of the rotator cuff and this was debrided with a shaver and the arthroscopic wand.  There was some impingement soft tissue  posteriorly which was also debrided.  The scope was then removed and inserted for the posterior portal and subacromial space and a new lateral portal was established.  There was significant bursitis and this was resected there was a hoped type III acromium and patient underwent subacromial decompression.  There was good subacromial space the small full-thickness rotator cuff tear was further debrided there was good attachment of the rotator cuff along the humeral head.  The instruments were removed the portals were closed using 2-0 nylon and a sterile dressing was applied   DISCHARGE PLANNING:  Antibiotic duration:preoperative  Weightbearing: NA  Pain medication: percocet  Dressing care/ Wound VAC:dry dressing  Ambulatory devices:NA  Discharge to: home  Follow-up: In the office 1 week post operative.

## 2020-08-22 NOTE — Transfer of Care (Signed)
Immediate Anesthesia Transfer of Care Note  Patient: Robert Ramsey  Procedure(s) Performed: RIGHT SHOULDER ARTHROSCOPY, DEBRIDEMENT, AND DECOMPRESSION (Right Shoulder)  Patient Location: PACU  Anesthesia Type:General and Regional  Level of Consciousness: drowsy and patient cooperative  Airway & Oxygen Therapy: Patient Spontanous Breathing  Post-op Assessment: Report given to RN and Post -op Vital signs reviewed and stable  Post vital signs: Reviewed and stable  Last Vitals:  Vitals Value Taken Time  BP 122/62 08/22/20 1049  Temp    Pulse 62 08/22/20 1050  Resp 18 08/22/20 1050  SpO2 95 % 08/22/20 1050  Vitals shown include unvalidated device data.  Last Pain:  Vitals:   08/22/20 0905  TempSrc:   PainSc: 3          Complications: No complications documented.

## 2020-08-23 ENCOUNTER — Encounter (HOSPITAL_COMMUNITY): Payer: Self-pay | Admitting: Orthopedic Surgery

## 2020-08-27 ENCOUNTER — Telehealth: Payer: Self-pay | Admitting: Orthopedic Surgery

## 2020-08-27 NOTE — Telephone Encounter (Signed)
Pt called stating he had surgery on his shoulder on 08/22/20 and he states the bandages look like they're starting to come apart and he would like to know if he can take off the bandage.   [phone]

## 2020-08-27 NOTE — Telephone Encounter (Signed)
08/1820 s/p right shoulder scope and debridement advised can remove dressing and apply band aid over incisions. Pt voiced understanding and will clal with any other questions. Confirmed appt for 09/04/20 at 4 pm

## 2020-08-29 ENCOUNTER — Telehealth: Payer: Self-pay | Admitting: Orthopedic Surgery

## 2020-08-29 NOTE — Telephone Encounter (Signed)
Pt called and states that he would like to know how often to change his bandages and if he is suppose to be keeping them covered still or no. CB [phone]

## 2020-08-29 NOTE — Telephone Encounter (Signed)
Called pt and advised that he can apply bandaids to portals or he can leave off what ever he feels most comfortable doing

## 2020-09-01 ENCOUNTER — Other Ambulatory Visit: Payer: Self-pay

## 2020-09-01 ENCOUNTER — Ambulatory Visit (INDEPENDENT_AMBULATORY_CARE_PROVIDER_SITE_OTHER): Payer: Medicare Other | Admitting: Internal Medicine

## 2020-09-01 ENCOUNTER — Encounter: Payer: Self-pay | Admitting: Internal Medicine

## 2020-09-01 VITALS — BP 144/72 | HR 86 | Ht 69.0 in | Wt 187.2 lb

## 2020-09-01 DIAGNOSIS — I442 Atrioventricular block, complete: Secondary | ICD-10-CM

## 2020-09-01 DIAGNOSIS — I10 Essential (primary) hypertension: Secondary | ICD-10-CM | POA: Diagnosis not present

## 2020-09-01 DIAGNOSIS — E7849 Other hyperlipidemia: Secondary | ICD-10-CM

## 2020-09-01 DIAGNOSIS — I2510 Atherosclerotic heart disease of native coronary artery without angina pectoris: Secondary | ICD-10-CM | POA: Diagnosis not present

## 2020-09-01 DIAGNOSIS — G4733 Obstructive sleep apnea (adult) (pediatric): Secondary | ICD-10-CM

## 2020-09-01 NOTE — Patient Instructions (Addendum)
Medication Instructions:  Your physician recommends that you continue on your current medications as directed. Please refer to the Current Medication list given to you today.  Labwork: None ordered.  Testing/Procedures: None ordered.  Follow-Up: Your physician wants you to follow-up in: one year with  Tommye Standard, PA-C    You will receive a reminder letter in the mail two months in advance. If you don't receive a letter, please call our office to schedule the follow-up appointment.  Remote monitoring is used to monitor your Pacemaker from home. This monitoring reduces the number of office visits required to check your device to one time per year. It allows [place] to keep an eye on the functioning of your device to ensure it is working properly. You are scheduled for a device check from home on 11/13/19. You may send your transmission at any time that day. If you have a wireless device, the transmission will be sent automatically. After your physician reviews your transmission, you will receive a postcard with your next transmission date.  Any Other Special Instructions Will Be Listed Below (If Applicable).  If you need a refill on your cardiac medications before your next appointment, please call your pharmacy.

## 2020-09-01 NOTE — Progress Notes (Signed)
PCP: Elby Showers, MD   Primary EP:  Dr Rayann Heman  Robert Ramsey is a 80 y.o. male who presents today for routine electrophysiology followup.  Since last being seen in our clinic, the patient reports doing very well.  Today, he denies symptoms of palpitations, chest pain, shortness of breath,  lower extremity edema, dizziness, presyncope, or syncope.  The patient is otherwise without complaint today.   Past Medical History:  Diagnosis Date  . Abdominal pain   . Abnormal stress test 09/15/10   SEPTAL DEFECT SECONDARY TO PREVIOUS INFARCT VS LBBB; MILD SEPTAL ISCHEMIA  . Acid reflux   . Arthritis   . CAD (coronary artery disease)    cath in 2007 reveals mild diffuse CAD  . Colon polyps   . Complete heart block [facility]) May 2011   has PTVP in place  . Diverticulosis   . Glucose intolerance (impaired glucose tolerance)   . Hemorrhoids   . Hiatal hernia   . History of colonoscopy   . HTN (hypertension)   . Hypercholesterolemia   . Memory impairment    better with lower dose statin  . Pacemaker    Medtronic  . PVC's (premature ventricular contractions)   . Sinusitis   . Sleep apnea    CPAP nightly  . Ventral hernia    Past Surgical History:  Procedure Laterality Date  . APPENDECTOMY    . CARDIAC CATHETERIZATION  07/29/2005   EF 50-55%; Has mild diffuse CAD  . cataract surgery  06/2009, 10/2014   Dr. Kathrin Penner  . COLONOSCOPY    . CPAP    . EYE SURGERY    . INSERT / REPLACE / REMOVE PACEMAKER  11/18/2009   w/ Dr. Doreatha Lew  . LBBB    . NOSE SURGERY    . PACEMAKER INSERTION  11/18/09   MDT implanted by Dr Doreatha Lew  . REFRACTIVE SURGERY Bilateral   . right nephrectomy  01/2006   partial for mass  . SHOULDER ARTHROSCOPY Right 08/22/2020   Procedure: RIGHT SHOULDER ARTHROSCOPY, DEBRIDEMENT, AND DECOMPRESSION;  Surgeon: Newt Minion, MD;  Location: [place];  Service: Orthopedics;  Laterality: Right;  . TONSILLECTOMY      ROS- all systems are reviewed and negative except as  per HPI above  Current Outpatient Medications  Medication Sig Dispense Refill  . aspirin 81 MG tablet Take 81 mg by mouth daily.    . Carboxymethylcellul-Glycerin (LUBRICATING EYE DROPS OP) Place 1 drop into both eyes daily as needed (dry eyes).    . ezetimibe (ZETIA) 10 MG tablet TAKE 1 TABLET BY MOUTH  DAILY 90 tablet 3  . famotidine (PEPCID) 20 MG tablet Take 20 mg by mouth 2 (two) times daily.    Marland Kitchen losartan (COZAAR) 25 MG tablet TAKE 1 TABLET BY MOUTH  DAILY 90 tablet 3  . Multiple Vitamin (MULTIVITAMIN) tablet Take 1 tablet by mouth daily after supper.    . Multiple Vitamins-Minerals (PRESERVISION AREDS 2 PO) Take 1 tablet by mouth 2 (two) times daily.    Marland Kitchen MYRBETRIQ 50 MG TB24 tablet Take 50 mg by mouth daily.    Marland Kitchen oxyCODONE-acetaminophen (PERCOCET) 5-325 MG tablet Take 1 tablet by mouth every 4 (four) hours as needed. 30 tablet 0  . rosuvastatin (CRESTOR) 5 MG tablet TAKE ONE-HALF TABLET BY  MOUTH 3 TIMES WEEKLY (Patient taking differently: TAKE ONE-HALF TABLET BY  MOUTH 3 TIMES WEEKLY) 20 tablet 3  . sertraline (ZOLOFT) 25 MG tablet TAKE 1 TABLET BY MOUTH  DAILY (Patient taking differently: TAKE 1 TABLET BY MOUTH  DAILY) 90 tablet 3   No current facility-administered medications for this visit.    Physical Exam: Vitals:   09/01/20 1450  BP: (!) 144/72  Pulse: 86  SpO2: 98%  Weight: 187 lb 3.2 oz (84.9 kg)  Height: 5\' 9"  (1.753 m)    GEN- The patient is well appearing, alert and oriented x 3 today.   Head- normocephalic, atraumatic Eyes-  Sclera clear, conjunctiva pink Ears- hearing intact Oropharynx- clear Lungs- Clear to ausculation bilaterally, normal work of breathing Chest- pacemaker pocket is well healed Heart- Regular rate and rhythm, no murmurs, rubs or gallops, PMI not laterally displaced GI- soft, NT, ND, + BS Extremities- no clubbing, cyanosis, or edema  Pacemaker interrogation- reviewed in detail today,  See PACEART report  ekg tracing ordered today is  personally reviewed and shows sinus with V pacing  Assessment and Plan:  1. Symptomatic complete heart block Normal pacemaker function See Pace Art report No changes today he is device dependant today  2. HTN Stable No change required today  3. HL Continue statin  4. OSA Uses CPAP  Risks, benefits and potential toxicities for medications prescribed and/or refilled reviewed with patient today.   Return to see EP PA annually  [name], [facility] 09/01/2020 2:59 PM

## 2020-09-04 ENCOUNTER — Encounter: Payer: Self-pay | Admitting: Orthopedic Surgery

## 2020-09-04 ENCOUNTER — Ambulatory Visit (INDEPENDENT_AMBULATORY_CARE_PROVIDER_SITE_OTHER): Payer: Medicare Other | Admitting: Physician Assistant

## 2020-09-04 ENCOUNTER — Other Ambulatory Visit: Payer: Self-pay

## 2020-09-04 DIAGNOSIS — M7541 Impingement syndrome of right shoulder: Secondary | ICD-10-CM

## 2020-09-04 NOTE — Progress Notes (Signed)
Office Visit Note   Patient: Robert Ramsey           Date of Birth: 10-11-1940           MRN: [id number] Visit Date: 09/04/2020              Requested by: Elby Showers, MD [address] [place],  [place] [address] PCP: Elby Showers, MD  Chief Complaint  Patient presents with  . Right Shoulder - Routine Post Op    08/22/20 right shoulder scope ,deb,decomp       HPI: Patient is almost 2 weeks status post right shoulder arthroscopy decompression and debridement.  He is doing very well.  He has been working on range of motion exercises on his own  Assessment & Plan: Visit Diagnoses: No diagnosis found.  Plan: We discussed physical therapy.  Patient has worked with PT before and would like to continue on his own for now.  I did give him a handout on reviewing the different types of exercises.  He will follow-up for final visit in 4 weeks  Follow-Up Instructions: No follow-ups on file.   Ortho Exam  Patient is alert, oriented, no adenopathy, well-dressed, normal affect, normal respiratory effort. Well-healed surgical portals.  Surgical sutures were removed today.  No swelling no cellulitis.  He has forward elevation to 170 degrees some stiffness with internal rotation he can go behind his back to his beltline.  External rotation is about two thirds of the unaffected side.  Good resisted abduction strength.  Imaging: No results found. No images are attached to the encounter.  Labs: Lab Results  Component Value Date   HGBA1C 5.5 03/27/2020   HGBA1C 5.4 03/26/2019   HGBA1C 5.3 12/22/2018   ESRSEDRATE 12 11/06/2015   ESRSEDRATE 42 (H) 08/17/2013   LABURIC 6.7 11/06/2015   LABURIC 6.6 08/17/2013     Lab Results  Component Value Date   ALBUMIN 4.2 10/01/2019   ALBUMIN 4.4 02/26/2019   ALBUMIN 4.4 02/15/2018   PREALBUMIN 22 03/26/2019   PREALBUMIN 20 (L) 01/15/2019   PREALBUMIN 19 (L) 12/22/2018   LABURIC 6.7 11/06/2015   LABURIC 6.6 08/17/2013    No  results found for: MG No results found for: VD25OH  Lab Results  Component Value Date   PREALBUMIN 22 03/26/2019   PREALBUMIN 20 (L) 01/15/2019   PREALBUMIN 19 (L) 12/22/2018   CBC EXTENDED Latest Ref Rng & Units 08/20/2020 03/27/2020 10/01/2019  WBC 4.0 - 10.5 K/uL 6.3 7.8 5.0  RBC 4.22 - 5.81 MIL/uL 4.34 4.29 4.23  HGB 13.0 - 17.0 g/dL 14.0 13.9 13.6  HCT 39.0 - 52.0 % 42.2 41.4 40.6  PLT 150 - 400 K/uL 253 261 213  NEUTROABS 1,500 - 7,800 cells/uL - 5,655 3.4  LYMPHSABS 850 - 3,900 cells/uL - 1,576 0.9     There is no height or weight on file to calculate BMI.  Orders:  No orders of the defined types were placed in this encounter.  No orders of the defined types were placed in this encounter.    Procedures: No procedures performed  Clinical Data: No additional findings.  ROS:  All other systems negative, except as noted in the HPI. Review of Systems  Objective: Vital Signs: There were no vitals taken for this visit.  Specialty Comments:  No specialty comments available.  PMFS History: Patient Active Problem List   Diagnosis Date Noted  . Nontraumatic complete tear of right rotator cuff   . Impingement  syndrome of right shoulder   . Weight loss 12/30/2018  . Memory loss 01/15/2015  . Excessive daytime sleepiness 01/15/2015  . Hx of partial nephrectomy 09/20/2011  . CAD (coronary artery disease) 02/17/2011  . Hyperlipidemia 02/17/2011  . Pacemaker-Medtronic 02/17/2011  . Complete heart block ([place]) 11/06/2010  . Hypertension 11/06/2010   Past Medical History:  Diagnosis Date  . Abdominal pain   . Abnormal stress test 09/15/10   SEPTAL DEFECT SECONDARY TO PREVIOUS INFARCT VS LBBB; MILD SEPTAL ISCHEMIA  . Acid reflux   . Arthritis   . CAD (coronary artery disease)    cath in 2007 reveals mild diffuse CAD  . Colon polyps   . Complete heart block [facility]) May 2011   has PTVP in place  . Diverticulosis   . Glucose intolerance (impaired glucose tolerance)    . Hemorrhoids   . Hiatal hernia   . History of colonoscopy   . HTN (hypertension)   . Hypercholesterolemia   . Memory impairment    better with lower dose statin  . Pacemaker    Medtronic  . PVC's (premature ventricular contractions)   . Sinusitis   . Sleep apnea    CPAP nightly  . Ventral hernia     Family History  Problem Relation Age of Onset  . Heart failure Father   . Leukemia Mother     Past Surgical History:  Procedure Laterality Date  . APPENDECTOMY    . CARDIAC CATHETERIZATION  07/29/2005   EF 50-55%; Has mild diffuse CAD  . cataract surgery  06/2009, 10/2014   Dr. Kathrin Penner  . COLONOSCOPY    . CPAP    . EYE SURGERY    . INSERT / REPLACE / REMOVE PACEMAKER  11/18/2009   w/ Dr. Doreatha Lew  . LBBB    . NOSE SURGERY    . PACEMAKER INSERTION  11/18/09   MDT implanted by Dr Doreatha Lew  . REFRACTIVE SURGERY Bilateral   . right nephrectomy  01/2006   partial for mass  . SHOULDER ARTHROSCOPY Right 08/22/2020   Procedure: RIGHT SHOULDER ARTHROSCOPY, DEBRIDEMENT, AND DECOMPRESSION;  Surgeon: Newt Minion, MD;  Location: [place];  Service: Orthopedics;  Laterality: Right;  . TONSILLECTOMY     Social History   Occupational History  . Occupation: Retired    Comment: [employer]  Tobacco Use  . Smoking status: Former Smoker    Quit date: 07/06/1983    Years since quitting: 37.1  . Smokeless tobacco: Never Used  . Tobacco comment: stopped smoking in 1985  Vaping Use  . Vaping Use: Never used  Substance and Sexual Activity  . Alcohol use: No  . Drug use: No  . Sexual activity: Not Currently

## 2020-09-05 DIAGNOSIS — H353221 Exudative age-related macular degeneration, left eye, with active choroidal neovascularization: Secondary | ICD-10-CM | POA: Diagnosis not present

## 2020-10-01 ENCOUNTER — Other Ambulatory Visit: Payer: Self-pay

## 2020-10-01 ENCOUNTER — Encounter: Payer: Self-pay | Admitting: Adult Health

## 2020-10-01 ENCOUNTER — Ambulatory Visit (INDEPENDENT_AMBULATORY_CARE_PROVIDER_SITE_OTHER): Payer: Medicare Other | Admitting: Adult Health

## 2020-10-01 VITALS — BP 147/81 | HR 88 | Ht 68.0 in | Wt 190.0 lb

## 2020-10-01 DIAGNOSIS — R413 Other amnesia: Secondary | ICD-10-CM

## 2020-10-01 DIAGNOSIS — I2510 Atherosclerotic heart disease of native coronary artery without angina pectoris: Secondary | ICD-10-CM

## 2020-10-01 DIAGNOSIS — G4733 Obstructive sleep apnea (adult) (pediatric): Secondary | ICD-10-CM

## 2020-10-01 DIAGNOSIS — Z9989 Dependence on other enabling machines and devices: Secondary | ICD-10-CM | POA: Diagnosis not present

## 2020-10-01 NOTE — Progress Notes (Addendum)
PATIENT: Robert Ramsey DOB: 07-25-1940  REASON FOR VISIT: follow up HISTORY FROM: patient  HISTORY OF PRESENT ILLNESS: Today 10/01/20:  [name] is an 80 year old male with a history of obstructive sleep apnea on CPAP and memory disturbance.  He returns today for follow-up.  His CPAP download shows excellent compliance.  Denies any issues with the CPAP.  Feels that his memory has remained relatively stable.  He lives at home alone.  His daughters check on him frequently and help him with his meals.  He is able to complete all ADLs independently.  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  Returns today for an evaluation.    04/02/20:Mr. [name] is an 80 year old male with a history of memory disturbance and obstructive sleep apnea.  He returns today for follow-up on his memory.  He feels that overall things are stable.  He continues to live at home alone.  He does have a cat there.  He is able to complete all ADLs independently.  He completes all household chores independently.  His daughters provide his meals.  He denies any trouble sleeping.  Continues to use CPAP consistently.  Overall he feels that he is doing well.  HISTORY 10/01/19:  [name] is an 80 year old male with a history of obstructive sleep apnea on CPAP.  His download indicates that he uses machine nightly for compliance of 100%.  He uses machine greater than 4 hours 29 days for compliance of 97%.  On average he uses his machine 8 hours and 15 minutes.  His residual AHI is 0.4 on 9 cm of water with EPR 3.  Leak in the 95th percentile is 13 L/min he reports that the CPAP is working well for him.    He feels that his memory has remained stable.  He currently lives at home alone.  He is able to complete all ADLs independently.  He operates a Teacher, music without difficulty.  He is able to manage his own finances.  His daughter reports that he is repetitive other than that he has been doing well.  He returns today  for an evaluation.  REVIEW OF SYSTEMS: Out of a complete 14 system review of symptoms, the patient complains only of the following symptoms, and all other reviewed systems are negative.  See HPI  ALLERGIES: Allergies  Allergen Reactions  . Cinnamon Other (See Comments)    Cinnamon toothpaste caused him to break out inside his mouth and lesions were removed  . Flexeril [Cyclobenzaprine Hcl] Other (See Comments)    Possible heart side effects  . Levofloxacin Other (See Comments)    Elevated heart rate  . Nabumetone Other (See Comments)    Heart issues/hospital admission  . Naproxen Other (See Comments)    Effects blood pressure and heart  . Nsaids Other (See Comments)    Effects blood pressure and heart  . Vantin Other (See Comments)    Pseudo colitis  . Vioxx [Rofecoxib] Other (See Comments)    Effects blood pressure and heart  . Acyclovir And Related     Unknown reaction  . Imodium [Loperamide Hcl]     unknown    HOME MEDICATIONS: Outpatient Medications Prior to Visit  Medication Sig Dispense Refill  . aspirin 81 MG tablet Take 81 mg by mouth daily.    . Carboxymethylcellul-Glycerin (LUBRICATING EYE DROPS OP) Place 1 drop into both eyes daily as needed (dry eyes).    . ezetimibe (ZETIA) 10 MG tablet TAKE 1 TABLET  BY MOUTH  DAILY 90 tablet 3  . famotidine (PEPCID) 20 MG tablet Take 20 mg by mouth 2 (two) times daily.    Marland Kitchen losartan (COZAAR) 25 MG tablet TAKE 1 TABLET BY MOUTH  DAILY 90 tablet 3  . Multiple Vitamin (MULTIVITAMIN) tablet Take 1 tablet by mouth daily after supper.    . Multiple Vitamins-Minerals (PRESERVISION AREDS 2 PO) Take 1 tablet by mouth 2 (two) times daily.    Marland Kitchen MYRBETRIQ 50 MG TB24 tablet Take 50 mg by mouth daily.    Marland Kitchen oxyCODONE-acetaminophen (PERCOCET) 5-325 MG tablet Take 1 tablet by mouth every 4 (four) hours as needed. 30 tablet 0  . rosuvastatin (CRESTOR) 5 MG tablet TAKE ONE-HALF TABLET BY  MOUTH 3 TIMES WEEKLY (Patient taking differently: TAKE  ONE-HALF TABLET BY  MOUTH 3 TIMES WEEKLY) 20 tablet 3  . sertraline (ZOLOFT) 25 MG tablet TAKE 1 TABLET BY MOUTH  DAILY (Patient taking differently: TAKE 1 TABLET BY MOUTH  DAILY) 90 tablet 3   No facility-administered medications prior to visit.    PAST MEDICAL HISTORY: Past Medical History:  Diagnosis Date  . Abdominal pain   . Abnormal stress test 09/15/10   SEPTAL DEFECT SECONDARY TO PREVIOUS INFARCT VS LBBB; MILD SEPTAL ISCHEMIA  . Acid reflux   . Arthritis   . CAD (coronary artery disease)    cath in 2007 reveals mild diffuse CAD  . Colon polyps   . Complete heart block [facility]) May 2011   has PTVP in place  . Diverticulosis   . Glucose intolerance (impaired glucose tolerance)   . Hemorrhoids   . Hiatal hernia   . History of colonoscopy   . HTN (hypertension)   . Hypercholesterolemia   . Memory impairment    better with lower dose statin  . Pacemaker    Medtronic  . PVC's (premature ventricular contractions)   . Sinusitis   . Sleep apnea    CPAP nightly  . Ventral hernia     PAST SURGICAL HISTORY: Past Surgical History:  Procedure Laterality Date  . APPENDECTOMY    . CARDIAC CATHETERIZATION  07/29/2005   EF 50-55%; Has mild diffuse CAD  . cataract surgery  06/2009, 10/2014   Dr. Kathrin Penner  . COLONOSCOPY    . CPAP    . EYE SURGERY    . INSERT / REPLACE / REMOVE PACEMAKER  11/18/2009   w/ Dr. Doreatha Lew  . LBBB    . NOSE SURGERY    . PACEMAKER INSERTION  11/18/09   MDT implanted by Dr Doreatha Lew  . REFRACTIVE SURGERY Bilateral   . right nephrectomy  01/2006   partial for mass  . SHOULDER ARTHROSCOPY Right 08/22/2020   Procedure: RIGHT SHOULDER ARTHROSCOPY, DEBRIDEMENT, AND DECOMPRESSION;  Surgeon: Newt Minion, MD;  Location: [place];  Service: Orthopedics;  Laterality: Right;  . TONSILLECTOMY      FAMILY HISTORY: Family History  Problem Relation Age of Onset  . Heart failure Father   . Leukemia Mother     SOCIAL HISTORY: Social History   Socioeconomic  History  . Marital status: Married    Spouse name: [name]  . Number of children: 4  . Years of education: 12  . Highest education level: Not on file  Occupational History  . Occupation: Retired    Comment: [employer]  Tobacco Use  . Smoking status: Former Smoker    Quit date: 07/06/1983    Years since quitting: 37.2  . Smokeless tobacco: Never Used  .  Tobacco comment: stopped smoking in 1985  Vaping Use  . Vaping Use: Never used  Substance and Sexual Activity  . Alcohol use: No  . Drug use: No  . Sexual activity: Not Currently  Other Topics Concern  . Not on file  Social History Narrative   Retired from [employer], [employer].   Lives at home with wife   Caffeine use- morning coffee and 2 cup/ 1 cup of tea at lunch    Social Determinants of Health   Financial Resource Strain: Not on file  Food Insecurity: Not on file  Transportation Needs: Not on file  Physical Activity: Not on file  Stress: Not on file  Social Connections: Not on file  Intimate Partner Violence: Not on file      PHYSICAL EXAM  Vitals:   10/01/20 0834  Weight: 190 lb (86.2 kg)  Height: 5\' 8"  (1.727 m)   Body mass index is 28.89 kg/m.   MMSE - Mini Mental State Exam 10/01/2020 04/02/2020 10/01/2019  Orientation to time 4 4 5   Orientation to Place 5 5 4   Registration 3 3 3   Attention/ Calculation 4 5 4   Recall 1 2 0  Language- name 2 objects 2 2 2   Language- repeat 1 1 1   Language- follow 3 step command 3 3 3   Language- read & follow direction 1 1 1   Write a sentence 1 1 1   Copy design 1 1 1   Total score 26 28 25      Generalized: Well developed, in no acute distress   Neurological examination  Mentation: Alert oriented to time, place, history taking. Follows all commands speech and language fluent Cranial nerve II-XII: Pupils were equal round reactive to light. Extraocular movements were full, visual field were full on confrontational test.  Head turning and shoulder shrug  were normal and  symmetric. Motor: The motor testing reveals 5 over 5 strength of all 4 extremities. Good symmetric motor tone is noted throughout.  Sensory: Sensory testing is intact to soft touch on all 4 extremities. No evidence of extinction is noted.  Coordination: Cerebellar testing reveals good finger-nose-finger and heel-to-shin bilaterally.  Gait and station: Gait is normal.  Reflexes: Deep tendon reflexes are symmetric and normal bilaterally.   DIAGNOSTIC DATA (LABS, IMAGING, TESTING) - I reviewed patient records, labs, notes, testing and imaging myself where available.  Lab Results  Component Value Date   WBC 6.3 08/20/2020   HGB 14.0 08/20/2020   HCT 42.2 08/20/2020   MCV 97.2 08/20/2020   PLT 253 08/20/2020      Component Value Date/Time   NA 141 08/20/2020 1143   NA 142 10/01/2019 1113   K 5.0 08/20/2020 1143   CL 108 08/20/2020 1143   CO2 24 08/20/2020 1143   GLUCOSE 75 08/20/2020 1143   BUN 22 08/20/2020 1143   BUN 19 10/01/2019 1113   CREATININE 1.02 08/20/2020 1143   CREATININE 1.04 03/27/2020 0910   CALCIUM 10.3 08/20/2020 1143   PROT 6.6 03/27/2020 0910   PROT 6.2 10/01/2019 1113   ALBUMIN 4.2 10/01/2019 1113   AST 21 03/27/2020 0910   ALT 15 03/27/2020 0910   ALKPHOS 57 10/01/2019 1113   BILITOT 0.4 03/27/2020 0910   BILITOT 0.3 10/01/2019 1113   GFRNONAA >60 08/20/2020 1143   GFRNONAA 67 03/27/2020 0910   GFRAA 78 03/27/2020 0910   Lab Results  Component Value Date   CHOL 140 03/27/2020   HDL 53 03/27/2020   LDLCALC 69 03/27/2020  TRIG 93 03/27/2020   CHOLHDL 2.6 03/27/2020   Lab Results  Component Value Date   HGBA1C 5.5 03/27/2020   Lab Results  Component Value Date   YJEHUDJS97 026 10/01/2019   Lab Results  Component Value Date   TSH 1.53 12/11/2018      ASSESSMENT AND PLAN 80 y.o. year old male  has a past medical history of Abdominal pain, Abnormal stress test (09/15/10), Acid reflux, Arthritis, CAD (coronary artery disease), Colon polyps,  Complete heart block [facility]) (May 2011), Diverticulosis, Glucose intolerance (impaired glucose tolerance), Hemorrhoids, Hiatal hernia, History of colonoscopy, HTN (hypertension), Hypercholesterolemia, Memory impairment, Pacemaker, PVC's (premature ventricular contractions), Sinusitis, Sleep apnea, and Ventral hernia. here with:  1.  Memory disturbance   Memory score stable MMSE 26 out of 30 previously 28 out of 30  We will continue to monitor  2.  Obstructive sleep apnea on CPAP   Encouraged patient to continue using nightly and greater than 4 hours each night  Good compliance and treatment of apnea  Follow-up in 1 year or sooner if needed   I spent 30 minutes of face-to-face and non-face-to-face time with patient.  This included previsit chart review, lab review, study review, order entry, electronic health record documentation, patient education.  Ward Givens, MSN, NP-C 10/01/2020, 8:36 AM [facility] [address] [place], [place] [address] [phone]  .I reviewed the above note and documentation by the Nurse Practitioner and agree with the history, exam, assessment and plan as outlined above. I was available for consultation. Star Age, MD, PhD [facility])

## 2020-10-01 NOTE — Patient Instructions (Signed)
Continue using CPAP nightly and greater than 4 hours each night Memory score is stable If your symptoms worsen or you develop new symptoms please let us know.

## 2020-10-03 ENCOUNTER — Encounter: Payer: Self-pay | Admitting: Physician Assistant

## 2020-10-03 ENCOUNTER — Ambulatory Visit (INDEPENDENT_AMBULATORY_CARE_PROVIDER_SITE_OTHER): Payer: Medicare Other | Admitting: Physician Assistant

## 2020-10-03 DIAGNOSIS — M7541 Impingement syndrome of right shoulder: Secondary | ICD-10-CM

## 2020-10-03 NOTE — Progress Notes (Signed)
Office Visit Note   Patient: Robert Ramsey           Date of Birth: Mar 10, 1941           MRN: [id number] Visit Date: 10/03/2020              Requested by: Elby Showers, MD [address] [place],  [place] [id number] PCP: Elby Showers, MD  Chief Complaint  Patient presents with  . Right Shoulder - Routine Post Op    08/22/20 right shoulder scope, debridement and decompression       HPI: Patient is a pleasant 80 year old gentleman who is 6 weeks status post right shoulder arthroscopy with decompression and debridement at his last visit I gave him some exercises to do on his own.  He reports he is feeling much better than prior to surgery.  He has pain that is tolerable over the deltoid.  This does not limit him in any way.  He describes it as an aching.  Assessment & Plan: Visit Diagnoses: No diagnosis found.  Plan: Patient may follow-up as needed  Follow-Up Instructions: No follow-ups on file.   Ortho Exam  Patient is alert, oriented, no adenopathy, well-dressed, normal affect, normal respiratory effort. Examination of shoulder demonstrates well-healed surgical portals he has almost 180 degrees of forward extension he can go back behind his back without pain to above his belt line.  He has excellent strength with abductor resistance.  A little achy over the deltoid otherwise no tenderness to palpation clinically well-maintained alignment  Imaging: No results found. No images are attached to the encounter.  Labs: Lab Results  Component Value Date   HGBA1C 5.5 03/27/2020   HGBA1C 5.4 03/26/2019   HGBA1C 5.3 12/22/2018   ESRSEDRATE 12 11/06/2015   ESRSEDRATE 42 (H) 08/17/2013   LABURIC 6.7 11/06/2015   LABURIC 6.6 08/17/2013     Lab Results  Component Value Date   ALBUMIN 4.2 10/01/2019   ALBUMIN 4.4 02/26/2019   ALBUMIN 4.4 02/15/2018   PREALBUMIN 22 03/26/2019   PREALBUMIN 20 (L) 01/15/2019   PREALBUMIN 19 (L) 12/22/2018    No results found for:  MG No results found for: VD25OH  Lab Results  Component Value Date   PREALBUMIN 22 03/26/2019   PREALBUMIN 20 (L) 01/15/2019   PREALBUMIN 19 (L) 12/22/2018   CBC EXTENDED Latest Ref Rng & Units 08/20/2020 03/27/2020 10/01/2019  WBC 4.0 - 10.5 K/uL 6.3 7.8 5.0  RBC 4.22 - 5.81 MIL/uL 4.34 4.29 4.23  HGB 13.0 - 17.0 g/dL 14.0 13.9 13.6  HCT 39.0 - 52.0 % 42.2 41.4 40.6  PLT 150 - 400 K/uL 253 261 213  NEUTROABS 1,500 - 7,800 cells/uL - 5,655 3.4  LYMPHSABS 850 - 3,900 cells/uL - 1,576 0.9     There is no height or weight on file to calculate BMI.  Orders:  No orders of the defined types were placed in this encounter.  No orders of the defined types were placed in this encounter.    Procedures: No procedures performed  Clinical Data: No additional findings.  ROS:  All other systems negative, except as noted in the HPI. Review of Systems  Objective: Vital Signs: There were no vitals taken for this visit.  Specialty Comments:  No specialty comments available.  PMFS History: Patient Active Problem List   Diagnosis Date Noted  . Nontraumatic complete tear of right rotator cuff   . Impingement syndrome of right shoulder   . Weight loss  12/30/2018  . Memory loss 01/15/2015  . Excessive daytime sleepiness 01/15/2015  . Hx of partial nephrectomy 09/20/2011  . CAD (coronary artery disease) 02/17/2011  . Hyperlipidemia 02/17/2011  . Pacemaker-Medtronic 02/17/2011  . Complete heart block ([place]) 11/06/2010  . Hypertension 11/06/2010   Past Medical History:  Diagnosis Date  . Abdominal pain   . Abnormal stress test 09/15/10   SEPTAL DEFECT SECONDARY TO PREVIOUS INFARCT VS LBBB; MILD SEPTAL ISCHEMIA  . Acid reflux   . Arthritis   . CAD (coronary artery disease)    cath in 2007 reveals mild diffuse CAD  . Colon polyps   . Complete heart block [facility]) May 2011   has PTVP in place  . Diverticulosis   . Glucose intolerance (impaired glucose tolerance)   . Hemorrhoids    . Hiatal hernia   . History of colonoscopy   . HTN (hypertension)   . Hypercholesterolemia   . Memory impairment    better with lower dose statin  . Pacemaker    Medtronic  . PVC's (premature ventricular contractions)   . Sinusitis   . Sleep apnea    CPAP nightly  . Ventral hernia     Family History  Problem Relation Age of Onset  . Heart failure Father   . Leukemia Mother     Past Surgical History:  Procedure Laterality Date  . APPENDECTOMY    . CARDIAC CATHETERIZATION  07/29/2005   EF 50-55%; Has mild diffuse CAD  . cataract surgery  06/2009, 10/2014   Dr. Kathrin Penner  . COLONOSCOPY    . CPAP    . EYE SURGERY    . INSERT / REPLACE / REMOVE PACEMAKER  11/18/2009   w/ Dr. Doreatha Lew  . LBBB    . NOSE SURGERY    . PACEMAKER INSERTION  11/18/09   MDT implanted by Dr Doreatha Lew  . REFRACTIVE SURGERY Bilateral   . right nephrectomy  01/2006   partial for mass  . SHOULDER ARTHROSCOPY Right 08/22/2020   Procedure: RIGHT SHOULDER ARTHROSCOPY, DEBRIDEMENT, AND DECOMPRESSION;  Surgeon: Newt Minion, MD;  Location: [place];  Service: Orthopedics;  Laterality: Right;  . TONSILLECTOMY     Social History   Occupational History  . Occupation: Retired    Comment: [employer]  Tobacco Use  . Smoking status: Former Smoker    Quit date: 07/06/1983    Years since quitting: 37.2  . Smokeless tobacco: Never Used  . Tobacco comment: stopped smoking in 1985  Vaping Use  . Vaping Use: Never used  Substance and Sexual Activity  . Alcohol use: No  . Drug use: No  . Sexual activity: Not Currently

## 2020-10-07 DIAGNOSIS — H353221 Exudative age-related macular degeneration, left eye, with active choroidal neovascularization: Secondary | ICD-10-CM | POA: Diagnosis not present

## 2020-10-09 DIAGNOSIS — H5319 Other subjective visual disturbances: Secondary | ICD-10-CM | POA: Diagnosis not present

## 2020-10-09 DIAGNOSIS — H353221 Exudative age-related macular degeneration, left eye, with active choroidal neovascularization: Secondary | ICD-10-CM | POA: Diagnosis not present

## 2020-10-09 DIAGNOSIS — H353112 Nonexudative age-related macular degeneration, right eye, intermediate dry stage: Secondary | ICD-10-CM | POA: Diagnosis not present

## 2020-10-09 DIAGNOSIS — H1131 Conjunctival hemorrhage, right eye: Secondary | ICD-10-CM | POA: Diagnosis not present

## 2020-10-09 DIAGNOSIS — H5712 Ocular pain, left eye: Secondary | ICD-10-CM | POA: Diagnosis not present

## 2020-10-27 DIAGNOSIS — H353132 Nonexudative age-related macular degeneration, bilateral, intermediate dry stage: Secondary | ICD-10-CM | POA: Diagnosis not present

## 2020-10-27 DIAGNOSIS — H52203 Unspecified astigmatism, bilateral: Secondary | ICD-10-CM | POA: Diagnosis not present

## 2020-10-27 DIAGNOSIS — H04123 Dry eye syndrome of bilateral lacrimal glands: Secondary | ICD-10-CM | POA: Diagnosis not present

## 2020-10-27 DIAGNOSIS — H43813 Vitreous degeneration, bilateral: Secondary | ICD-10-CM | POA: Diagnosis not present

## 2020-10-29 DIAGNOSIS — H35453 Secondary pigmentary degeneration, bilateral: Secondary | ICD-10-CM | POA: Diagnosis not present

## 2020-10-29 DIAGNOSIS — H353112 Nonexudative age-related macular degeneration, right eye, intermediate dry stage: Secondary | ICD-10-CM | POA: Diagnosis not present

## 2020-10-29 DIAGNOSIS — H35363 Drusen (degenerative) of macula, bilateral: Secondary | ICD-10-CM | POA: Diagnosis not present

## 2020-10-29 DIAGNOSIS — H353221 Exudative age-related macular degeneration, left eye, with active choroidal neovascularization: Secondary | ICD-10-CM | POA: Diagnosis not present

## 2020-10-31 ENCOUNTER — Other Ambulatory Visit: Payer: Self-pay | Admitting: Internal Medicine

## 2020-11-10 DIAGNOSIS — H353221 Exudative age-related macular degeneration, left eye, with active choroidal neovascularization: Secondary | ICD-10-CM | POA: Diagnosis not present

## 2020-11-12 ENCOUNTER — Ambulatory Visit (INDEPENDENT_AMBULATORY_CARE_PROVIDER_SITE_OTHER): Payer: Medicare Other

## 2020-11-12 DIAGNOSIS — I442 Atrioventricular block, complete: Secondary | ICD-10-CM

## 2020-11-12 LAB — CUP PACEART REMOTE DEVICE CHECK
Battery Impedance: 1408 Ohm
Battery Remaining Longevity: 48 mo
Battery Voltage: 2.77 V
Brady Statistic AP VP Percent: 5 %
Brady Statistic AP VS Percent: 0 %
Brady Statistic AS VP Percent: 94 %
Brady Statistic AS VS Percent: 1 %
Date Time Interrogation Session: 20220511074128
Implantable Lead Implant Date: 20110517
Implantable Lead Implant Date: 20110517
Implantable Lead Location: 753859
Implantable Lead Location: 753860
Implantable Lead Model: 4469
Implantable Lead Model: 4470
Implantable Lead Serial Number: 543103
Implantable Lead Serial Number: 672341
Implantable Pulse Generator Implant Date: 20110517
Lead Channel Impedance Value: 640 Ohm
Lead Channel Impedance Value: 665 Ohm
Lead Channel Pacing Threshold Amplitude: 0.375 V
Lead Channel Pacing Threshold Amplitude: 0.5 V
Lead Channel Pacing Threshold Pulse Width: 0.4 ms
Lead Channel Pacing Threshold Pulse Width: 0.4 ms
Lead Channel Setting Pacing Amplitude: 2 V
Lead Channel Setting Pacing Amplitude: 2.5 V
Lead Channel Setting Pacing Pulse Width: 0.4 ms
Lead Channel Setting Sensing Sensitivity: 2 mV

## 2020-12-04 NOTE — Progress Notes (Signed)
Remote pacemaker transmission.   

## 2020-12-23 DIAGNOSIS — H353221 Exudative age-related macular degeneration, left eye, with active choroidal neovascularization: Secondary | ICD-10-CM | POA: Diagnosis not present

## 2021-01-27 DIAGNOSIS — H353221 Exudative age-related macular degeneration, left eye, with active choroidal neovascularization: Secondary | ICD-10-CM | POA: Diagnosis not present

## 2021-02-02 ENCOUNTER — Other Ambulatory Visit: Payer: Self-pay | Admitting: Internal Medicine

## 2021-02-11 ENCOUNTER — Ambulatory Visit (INDEPENDENT_AMBULATORY_CARE_PROVIDER_SITE_OTHER): Payer: Medicare Other

## 2021-02-11 DIAGNOSIS — I442 Atrioventricular block, complete: Secondary | ICD-10-CM

## 2021-02-11 LAB — CUP PACEART REMOTE DEVICE CHECK
Battery Impedance: 1490 Ohm
Battery Remaining Longevity: 46 mo
Battery Voltage: 2.77 V
Brady Statistic AP VP Percent: 6 %
Brady Statistic AP VS Percent: 0 %
Brady Statistic AS VP Percent: 93 %
Brady Statistic AS VS Percent: 1 %
Date Time Interrogation Session: 20220810065702
Implantable Lead Implant Date: 20110517
Implantable Lead Implant Date: 20110517
Implantable Lead Location: 753859
Implantable Lead Location: 753860
Implantable Lead Model: 4469
Implantable Lead Model: 4470
Implantable Lead Serial Number: 543103
Implantable Lead Serial Number: 672341
Implantable Pulse Generator Implant Date: 20110517
Lead Channel Impedance Value: 631 Ohm
Lead Channel Impedance Value: 678 Ohm
Lead Channel Pacing Threshold Amplitude: 0.375 V
Lead Channel Pacing Threshold Amplitude: 0.5 V
Lead Channel Pacing Threshold Pulse Width: 0.4 ms
Lead Channel Pacing Threshold Pulse Width: 0.4 ms
Lead Channel Setting Pacing Amplitude: 2 V
Lead Channel Setting Pacing Amplitude: 2.5 V
Lead Channel Setting Pacing Pulse Width: 0.4 ms
Lead Channel Setting Sensing Sensitivity: 2 mV

## 2021-03-05 DIAGNOSIS — L918 Other hypertrophic disorders of the skin: Secondary | ICD-10-CM | POA: Diagnosis not present

## 2021-03-05 DIAGNOSIS — H61022 Chronic perichondritis of left external ear: Secondary | ICD-10-CM | POA: Diagnosis not present

## 2021-03-05 DIAGNOSIS — D1801 Hemangioma of skin and subcutaneous tissue: Secondary | ICD-10-CM | POA: Diagnosis not present

## 2021-03-05 DIAGNOSIS — L814 Other melanin hyperpigmentation: Secondary | ICD-10-CM | POA: Diagnosis not present

## 2021-03-05 DIAGNOSIS — L821 Other seborrheic keratosis: Secondary | ICD-10-CM | POA: Diagnosis not present

## 2021-03-05 DIAGNOSIS — L820 Inflamed seborrheic keratosis: Secondary | ICD-10-CM | POA: Diagnosis not present

## 2021-03-05 DIAGNOSIS — L570 Actinic keratosis: Secondary | ICD-10-CM | POA: Diagnosis not present

## 2021-03-05 NOTE — Progress Notes (Signed)
Remote pacemaker transmission.   

## 2021-03-10 DIAGNOSIS — H353221 Exudative age-related macular degeneration, left eye, with active choroidal neovascularization: Secondary | ICD-10-CM | POA: Diagnosis not present

## 2021-03-26 ENCOUNTER — Other Ambulatory Visit: Payer: Medicare Other | Admitting: Internal Medicine

## 2021-03-26 ENCOUNTER — Other Ambulatory Visit: Payer: Self-pay

## 2021-03-26 DIAGNOSIS — Z0000 Encounter for general adult medical examination without abnormal findings: Secondary | ICD-10-CM

## 2021-03-26 DIAGNOSIS — I10 Essential (primary) hypertension: Secondary | ICD-10-CM | POA: Diagnosis not present

## 2021-03-26 DIAGNOSIS — Z125 Encounter for screening for malignant neoplasm of prostate: Secondary | ICD-10-CM | POA: Diagnosis not present

## 2021-03-26 LAB — LIPID PANEL
Cholesterol: 122 mg/dL (ref <200)
HDL: 48 mg/dL (ref >=40)
LDL Cholesterol (Calc): 58 mg/dL (calc)
Non-HDL Cholesterol (Calc): 74 mg/dL (calc) (ref <130)
Total CHOL/HDL Ratio: 2.5 (calc) (ref <5.0)
Triglycerides: 75 mg/dL (ref <150)

## 2021-03-26 LAB — COMPLETE METABOLIC PANEL WITH GFR
AG Ratio: 1.7 (calc) (ref 1.0–2.5)
ALT: 12 U/L (ref 9–46)
AST: 15 U/L (ref 10–35)
Albumin: 4 g/dL (ref 3.6–5.1)
Alkaline phosphatase (APISO): 53 U/L (ref 35–144)
BUN: 24 mg/dL (ref 7–25)
CO2: 26 mmol/L (ref 20–32)
Calcium: 9.8 mg/dL (ref 8.6–10.3)
Chloride: 109 mmol/L (ref 98–110)
Creat: 1.16 mg/dL (ref 0.70–1.22)
Globulin: 2.3 g/dL (calc) (ref 1.9–3.7)
Glucose, Bld: 101 mg/dL — ABNORMAL HIGH (ref 65–99)
Potassium: 4.7 mmol/L (ref 3.5–5.3)
Sodium: 141 mmol/L (ref 135–146)
Total Bilirubin: 0.4 mg/dL (ref 0.2–1.2)
Total Protein: 6.3 g/dL (ref 6.1–8.1)
eGFR: 63 mL/min/{1.73_m2} (ref >=60)

## 2021-03-26 LAB — CBC WITH DIFFERENTIAL/PLATELET
Absolute Monocytes: 570 cells/uL (ref 200–950)
Basophils Absolute: 40 cells/uL (ref 0–200)
Basophils Relative: 0.7 %
Eosinophils Absolute: 182 cells/uL (ref 15–500)
Eosinophils Relative: 3.2 %
HCT: 41.5 % (ref 38.5–50.0)
Hemoglobin: 13.8 g/dL (ref 13.2–17.1)
Lymphs Abs: 1100 cells/uL (ref 850–3900)
MCH: 32 pg (ref 27.0–33.0)
MCHC: 33.3 g/dL (ref 32.0–36.0)
MCV: 96.3 fL (ref 80.0–100.0)
MPV: 10.3 fL (ref 7.5–12.5)
Monocytes Relative: 10 %
Neutro Abs: 3808 cells/uL (ref 1500–7800)
Neutrophils Relative %: 66.8 %
Platelets: 241 10*3/uL (ref 140–400)
RBC: 4.31 10*6/uL (ref 4.20–5.80)
RDW: 13.2 % (ref 11.0–15.0)
Total Lymphocyte: 19.3 %
WBC: 5.7 10*3/uL (ref 3.8–10.8)

## 2021-03-26 LAB — PSA: PSA: 0.62 ng/mL (ref <=4.00)

## 2021-03-27 DIAGNOSIS — R350 Frequency of micturition: Secondary | ICD-10-CM | POA: Diagnosis not present

## 2021-03-27 DIAGNOSIS — N401 Enlarged prostate with lower urinary tract symptoms: Secondary | ICD-10-CM | POA: Diagnosis not present

## 2021-03-30 ENCOUNTER — Encounter: Payer: Self-pay | Admitting: Internal Medicine

## 2021-03-30 ENCOUNTER — Ambulatory Visit (INDEPENDENT_AMBULATORY_CARE_PROVIDER_SITE_OTHER): Payer: Medicare Other | Admitting: Internal Medicine

## 2021-03-30 ENCOUNTER — Other Ambulatory Visit: Payer: Self-pay

## 2021-03-30 VITALS — BP 128/70 | HR 86 | Temp 98.2°F | Resp 15 | Ht 68.0 in | Wt 200.0 lb

## 2021-03-30 DIAGNOSIS — Z905 Acquired absence of kidney: Secondary | ICD-10-CM

## 2021-03-30 DIAGNOSIS — H3530 Unspecified macular degeneration: Secondary | ICD-10-CM

## 2021-03-30 DIAGNOSIS — E7849 Other hyperlipidemia: Secondary | ICD-10-CM

## 2021-03-30 DIAGNOSIS — K219 Gastro-esophageal reflux disease without esophagitis: Secondary | ICD-10-CM

## 2021-03-30 DIAGNOSIS — I10 Essential (primary) hypertension: Secondary | ICD-10-CM

## 2021-03-30 DIAGNOSIS — R7302 Impaired glucose tolerance (oral): Secondary | ICD-10-CM

## 2021-03-30 DIAGNOSIS — Z23 Encounter for immunization: Secondary | ICD-10-CM | POA: Diagnosis not present

## 2021-03-30 DIAGNOSIS — R413 Other amnesia: Secondary | ICD-10-CM

## 2021-03-30 DIAGNOSIS — F09 Unspecified mental disorder due to known physiological condition: Secondary | ICD-10-CM | POA: Diagnosis not present

## 2021-03-30 DIAGNOSIS — Z0000 Encounter for general adult medical examination without abnormal findings: Secondary | ICD-10-CM

## 2021-03-30 DIAGNOSIS — I2510 Atherosclerotic heart disease of native coronary artery without angina pectoris: Secondary | ICD-10-CM

## 2021-03-30 DIAGNOSIS — Z950 Presence of cardiac pacemaker: Secondary | ICD-10-CM

## 2021-03-30 DIAGNOSIS — G4730 Sleep apnea, unspecified: Secondary | ICD-10-CM | POA: Diagnosis not present

## 2021-03-30 LAB — POCT URINALYSIS DIP (MANUAL ENTRY)
Bilirubin, UA: NEGATIVE
Glucose, UA: NEGATIVE mg/dL
Ketones, POC UA: NEGATIVE mg/dL
Leukocytes, UA: NEGATIVE
Nitrite, UA: NEGATIVE
Protein Ur, POC: NEGATIVE mg/dL
Spec Grav, UA: 1.015 (ref 1.010–1.025)
Urobilinogen, UA: 0.2 E.U./dL
pH, UA: 6 (ref 5.0–8.0)

## 2021-03-30 NOTE — Progress Notes (Signed)
Subjective:    Patient ID: Robert Ramsey, male    DOB: 08-Aug-1940, 79 y.o.   MRN: [id number]  HPI 80 year old Male seen for Medicare wellness and health maintenance exam.  Had right shoulder decompression and debridement  by Dr. Sharol Given in 04-Sep-2020. Went to  physical therapy and has recovered well.   Saw Dr. Rayann Heman, Cardiologist in 2022-09-04.  History of complete heart block with pacemaker in place.  History of hypertension-stable  Hyperlipidemia treated with statin medication  Has sleep apnea and uses CPAP.  He has mild memory loss.  Has not gotten lost.  Still drives.  He goes out to eat breakfast.  Family is supportive.  Social history: His wife passed away in 09-04-18.  He resides alone.  Family checks in on him.  He has a number of drug intolerances.  Most of these are intolerances rather than true allergies.  History of partial right nephrectomy for renal mass by Dr. Karsten Ro in September 04, 2010.  History of pacemaker in place for Mobitz 2 AV block.  Had cardiac catheterization 09/04/05 showing mild diffuse coronary artery disease.  History of impaired glucose tolerance and GE reflux.  History of appendectomy.  Had cataract surgery December 2010.  History of left bundle branch block.  He quit smoking in 05-Sep-1983.  He had neuropsychological testing by Dr. Irish Elders in 09-04-2014 and was diagnosed with mild cognitive impairment.  History of nonexudative age-related macular degeneration followed by ophthalmologist.  Colonoscopy done 2018-09-04 showed internal and external hemorrhoids and diverticulosis.  Study was done by Dr. Benjie Karvonen.  1 colon polyp removed in transverse colon described as polypoid colonic mucosa negative for dysplasia.  Family history: Father deceased due to heart failure.  Mother deceased due to leukemia.  Review of Systems sees Dr. Posey Pronto for bilateral macular degeneration and patient reports is stable. No chest pain or SOB. No falls.Has sleep apnea and wears CPAP device.     Objective:    Physical Exam Blood pressure 128/70 pulse 86 respiratory rate 15 temperature 98.2 pulse oximetry 96% weight 200 pounds BMI 30.41  Skin: Warm and dry.  No cervical adenopathy.  No carotid bruits.  TMs are clear.  Neck is supple.  Chest clear to auscultation.  Cardiac exam: Regular rate and rhythm.  Abdomen is soft nondistended without hepatosplenomegaly masses or tenderness.  Prostate normal without nodules.  No lower extremity pitting edema.  Affect is normal.  Neuro no gross focal deficits on brief exam.       Assessment & Plan:  Mild cognitive disorder-seems stable  History of pacemaker for Mobitz 2 AV block followed by cardiology  Impaired glucose tolerance-stable  GE reflux treated with Pepcid  Hypertension stable on low-dose Cozaar  History of mild diffuse coronary disease  Sleep apnea treated with CPAP  Hyperlipidemia treated with low-dose Crestor and Zetia  Sees urology for history of partial nephrectomy in the remote past and takes Myrbetriq  Macular degeneration followed by ophthalmology  Plan: Return in 1 year or as needed.  Family knows they can contact me if they have any concerns.  Labs reviewed including PSA CBC c-Met and lipid panel are within normal limits.  Fasting glucose 101.  Flu vaccine given.  He has had at least 2 COVID vaccines and should get booster in October.   Subjective:   Patient presents for Medicare Annual/Subsequent preventive examination.  Review Past Medical/Family/Social: See above   Risk Factors  Current exercise habits: Light Dietary issues discussed: Yes  Cardiac risk  factors: Hyperlipidemia  Depression Screen  (Note: if answer to either of the following is "Yes", a more complete depression screening is indicated)   Over the past two weeks, have you felt down, depressed or hopeless? No  Over the past two weeks, have you felt little interest or pleasure in doing things? No Have you lost interest or pleasure in daily life? No Do  you often feel hopeless? No Do you cry easily over simple problems? No   Activities of Daily Living  In your present state of health, do you have any difficulty performing the following activities?:   Driving? No  Managing money? No  Feeding yourself? No  Getting from bed to chair? No  Climbing a flight of stairs? No  Preparing food and eating?: No -family helps with this and he eats out some Bathing or showering? No  Getting dressed: No  Getting to the toilet? No  Using the toilet:No  Moving around from place to place: No  In the past year have you fallen or had a near fall?:No  Are you sexually active? No  Do you have more than one partner? No   Hearing Difficulties: No  Do you often ask people to speak up or repeat themselves? No  Do you experience ringing or noises in your ears? No  Do you have difficulty understanding soft or whispered voices? No  Do you feel that you have a problem with memory?  Yes Do you often misplace items? No    Home Safety:  Do you have a smoke alarm at your residence? Yes Do you have grab bars in the bathroom?  Yes Do you have throw rugs in your house?  yes   Cognitive Testing  Alert? Yes Normal Appearance?Yes  Oriented to person? Yes Place? Yes  Time? Yes  Recall of three objects? Yes  Can perform simple calculations? Yes  Displays appropriate judgment?Yes  Can read the correct time from a watch face?Yes   List the Names of Other Physician/Practitioners you currently use:  See referral list for the physicians patient is currently seeing.  Cardiologist  Neurologist   Review of Systems: See above   Objective:     General appearance: Appears stated age and in no acute distress Head: Normocephalic, without obvious abnormality, atraumatic  Eyes: conj clear, EOMi PEERLA  Ears: normal TM's and external ear canals both ears  Nose: Nares normal. Septum midline. Mucosa normal. No drainage or sinus tenderness.  Throat: lips, mucosa,  and tongue normal; teeth and gums normal  Neck: no adenopathy, no carotid bruit, no JVD, supple, symmetrical, trachea midline and thyroid not enlarged, symmetric, no tenderness/mass/nodules  No CVA tenderness.  Lungs: clear to auscultation bilaterally  Breasts: normal appearance, no masses or tenderness Heart: regular rate and rhythm, S1, S2 normal, no murmur, click, rub or gallop  Abdomen: soft, non-tender; bowel sounds normal; no masses, no organomegaly  Musculoskeletal: ROM normal in all joints, no crepitus, no deformity, Normal muscle strengthen. Back  is symmetric, no curvature. Skin: Skin color, texture, turgor normal. No rashes or lesions  Lymph nodes: Cervical, supraclavicular, and axillary nodes normal.  Neurologic: CN 2 -12 Normal, Normal symmetric reflexes. Normal coordination and gait  Psych: Alert & Oriented x 3, Mood appear stable.    Assessment:    Annual wellness medicare exam   Plan:    During the course of the visit the patient was educated and counseled about appropriate screening and preventive services including:   See above.  Flu vaccine given today.     Patient Instructions (the written plan) was given to the patient.  Medicare Attestation  I have personally reviewed:  The patient's medical and social history  Their use of alcohol, tobacco or illicit drugs  Their current medications and supplements  The patient's functional ability including ADLs,fall risks, home safety risks, cognitive, and hearing and visual impairment  Diet and physical activities  Evidence for depression or mood disorders  The patient's weight, height, BMI, and visual acuity have been recorded in the chart. I have made referrals, counseling, and provided education to the patient based on review of the above and I have provided the patient with a written personalized care plan for preventive services.

## 2021-03-30 NOTE — Patient Instructions (Addendum)
Flu Vaccine given.  It was a pleasure to see you today.  Labs are stable. Return in 1 year or as needed.  Be careful driving.  Continue follow-up with Cardiology and Neurology.  Had COVID booster next month.

## 2021-04-06 DIAGNOSIS — H35453 Secondary pigmentary degeneration, bilateral: Secondary | ICD-10-CM | POA: Diagnosis not present

## 2021-04-06 DIAGNOSIS — H35363 Drusen (degenerative) of macula, bilateral: Secondary | ICD-10-CM | POA: Diagnosis not present

## 2021-04-06 DIAGNOSIS — H353112 Nonexudative age-related macular degeneration, right eye, intermediate dry stage: Secondary | ICD-10-CM | POA: Diagnosis not present

## 2021-04-06 DIAGNOSIS — Z961 Presence of intraocular lens: Secondary | ICD-10-CM | POA: Diagnosis not present

## 2021-04-06 DIAGNOSIS — H353221 Exudative age-related macular degeneration, left eye, with active choroidal neovascularization: Secondary | ICD-10-CM | POA: Diagnosis not present

## 2021-04-13 DIAGNOSIS — Z23 Encounter for immunization: Secondary | ICD-10-CM | POA: Diagnosis not present

## 2021-04-21 DIAGNOSIS — H353221 Exudative age-related macular degeneration, left eye, with active choroidal neovascularization: Secondary | ICD-10-CM | POA: Diagnosis not present

## 2021-04-22 DIAGNOSIS — H1132 Conjunctival hemorrhage, left eye: Secondary | ICD-10-CM | POA: Diagnosis not present

## 2021-04-22 DIAGNOSIS — H5712 Ocular pain, left eye: Secondary | ICD-10-CM | POA: Diagnosis not present

## 2021-04-22 DIAGNOSIS — S0502XA Injury of conjunctiva and corneal abrasion without foreign body, left eye, initial encounter: Secondary | ICD-10-CM | POA: Diagnosis not present

## 2021-04-27 DIAGNOSIS — H1132 Conjunctival hemorrhage, left eye: Secondary | ICD-10-CM | POA: Diagnosis not present

## 2021-04-27 DIAGNOSIS — H5712 Ocular pain, left eye: Secondary | ICD-10-CM | POA: Diagnosis not present

## 2021-04-27 DIAGNOSIS — H353221 Exudative age-related macular degeneration, left eye, with active choroidal neovascularization: Secondary | ICD-10-CM | POA: Diagnosis not present

## 2021-04-30 DIAGNOSIS — H52203 Unspecified astigmatism, bilateral: Secondary | ICD-10-CM | POA: Diagnosis not present

## 2021-04-30 DIAGNOSIS — H353132 Nonexudative age-related macular degeneration, bilateral, intermediate dry stage: Secondary | ICD-10-CM | POA: Diagnosis not present

## 2021-04-30 DIAGNOSIS — Z961 Presence of intraocular lens: Secondary | ICD-10-CM | POA: Diagnosis not present

## 2021-05-13 ENCOUNTER — Ambulatory Visit (INDEPENDENT_AMBULATORY_CARE_PROVIDER_SITE_OTHER): Payer: Medicare Other

## 2021-05-13 DIAGNOSIS — I442 Atrioventricular block, complete: Secondary | ICD-10-CM | POA: Diagnosis not present

## 2021-05-14 LAB — CUP PACEART REMOTE DEVICE CHECK
Battery Impedance: 1598 Ohm
Battery Remaining Longevity: 44 mo
Battery Voltage: 2.76 V
Brady Statistic AP VP Percent: 6 %
Brady Statistic AP VS Percent: 0 %
Brady Statistic AS VP Percent: 94 %
Brady Statistic AS VS Percent: 1 %
Date Time Interrogation Session: 20221110103525
Implantable Lead Implant Date: 20110517
Implantable Lead Implant Date: 20110517
Implantable Lead Location: 753859
Implantable Lead Location: 753860
Implantable Lead Model: 4469
Implantable Lead Model: 4470
Implantable Lead Serial Number: 543103
Implantable Lead Serial Number: 672341
Implantable Pulse Generator Implant Date: 20110517
Lead Channel Impedance Value: 631 Ohm
Lead Channel Impedance Value: 724 Ohm
Lead Channel Pacing Threshold Amplitude: 0.375 V
Lead Channel Pacing Threshold Amplitude: 0.5 V
Lead Channel Pacing Threshold Pulse Width: 0.4 ms
Lead Channel Pacing Threshold Pulse Width: 0.4 ms
Lead Channel Setting Pacing Amplitude: 2 V
Lead Channel Setting Pacing Amplitude: 2.5 V
Lead Channel Setting Pacing Pulse Width: 0.4 ms
Lead Channel Setting Sensing Sensitivity: 2 mV

## 2021-05-21 NOTE — Progress Notes (Signed)
Remote pacemaker transmission.   

## 2021-06-02 DIAGNOSIS — H353221 Exudative age-related macular degeneration, left eye, with active choroidal neovascularization: Secondary | ICD-10-CM | POA: Diagnosis not present

## 2021-07-14 DIAGNOSIS — H353221 Exudative age-related macular degeneration, left eye, with active choroidal neovascularization: Secondary | ICD-10-CM | POA: Diagnosis not present

## 2021-07-20 ENCOUNTER — Encounter: Payer: Self-pay | Admitting: Internal Medicine

## 2021-07-20 ENCOUNTER — Ambulatory Visit (INDEPENDENT_AMBULATORY_CARE_PROVIDER_SITE_OTHER): Payer: Medicare Other | Admitting: Internal Medicine

## 2021-07-20 ENCOUNTER — Telehealth: Payer: Self-pay | Admitting: Internal Medicine

## 2021-07-20 ENCOUNTER — Other Ambulatory Visit: Payer: Self-pay

## 2021-07-20 VITALS — BP 160/82 | HR 91 | Temp 97.9°F | Wt 195.0 lb

## 2021-07-20 DIAGNOSIS — R82998 Other abnormal findings in urine: Secondary | ICD-10-CM | POA: Diagnosis not present

## 2021-07-20 DIAGNOSIS — Z1211 Encounter for screening for malignant neoplasm of colon: Secondary | ICD-10-CM

## 2021-07-20 DIAGNOSIS — R350 Frequency of micturition: Secondary | ICD-10-CM | POA: Diagnosis not present

## 2021-07-20 DIAGNOSIS — R413 Other amnesia: Secondary | ICD-10-CM

## 2021-07-20 DIAGNOSIS — Z1213 Encounter for screening for malignant neoplasm of small intestine: Secondary | ICD-10-CM | POA: Diagnosis not present

## 2021-07-20 DIAGNOSIS — R319 Hematuria, unspecified: Secondary | ICD-10-CM | POA: Diagnosis not present

## 2021-07-20 LAB — POCT URINALYSIS DIPSTICK
Bilirubin, UA: NEGATIVE
Glucose, UA: NEGATIVE
Ketones, UA: NEGATIVE
Nitrite, UA: NEGATIVE
Protein, UA: NEGATIVE
Spec Grav, UA: 1.01 (ref 1.010–1.025)
Urobilinogen, UA: 0.2 E.U./dL
pH, UA: 7 (ref 5.0–8.0)

## 2021-07-20 LAB — HEMOCCULT GUIAC POC 1CARD (OFFICE): Fecal Occult Blood, POC: NEGATIVE

## 2021-07-20 MED ORDER — TAMSULOSIN HCL 0.4 MG PO CAPS: 0.4000 mg | ORAL_CAPSULE | Freq: Every day | ORAL | 3 refills | Status: DC

## 2021-07-20 MED ORDER — POLYETHYLENE GLYCOL 3350 17 GM/SCOOP PO POWD: 17.0000 g | Freq: Two times a day (BID) | ORAL | 1 refills | Status: DC | PRN

## 2021-07-20 NOTE — Progress Notes (Signed)
° °  Subjective:    Patient ID: Robert Ramsey, male    DOB: 02/24/1941, 81 y.o.   MRN: [id number]  HPI 81 year old Male with history of mild memory loss seen today for couple of issues.  He has been constipated recently.  He bought some over-the-counter bisacodyl which gave him some relief but had been constipated for about 2 weeks previously.  Feels he still may be somewhat constipated.  Has been having a lot of urinary frequency recently several times daily which he thinks is a lot for him.  He was seen at [facility] urology in September for follow-up on a prostate nodule that was discovered in 2003.  His PSA was normal.  He has not had recurrent infections.  However the frequency is disturbing him.  Says he is not drinking excessive amounts of fluid.  Urology notes that when he was seen in September 2022 he had been maintained with Myrbetriq with minimal issues.  He had prostate exam at that time.  Post void residual was 68 cc.  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  He is also tried Vesicare previously for frequency and found that Lisbeth Ply worked better for him.  However note from September 2022 said he was on Myrbetriq.  Apparently does not have significant nocturia.  Denies dysuria, fever or chills.  His urine dipstick shows trace occult blood and trace LE.  Culture was sent.  With regard to constipation, he had colonoscopy in 2020 at [facility] by Dr. Watt Climes.  He had polypoid colonic mucosa removed that was negative for dysplasia.  He has history of constipation and was seen at [facility] in 2019.  This was thought to be due to a new antidepressant.  There is a remote history of reflux esophagitis.  He currently is on low-dose Zoloft 25 mg daily, Zetia, Crestor, low-dose losartan and Myrbetriq.  He takes famotidine twice daily for reflux.  Takes Flomax for BPH.    Review of Systems see above denies bright red blood per rectum or melena.      Objective:   Physical Exam  Abdomen is soft nondistended without hepatosplenomegaly masses or tenderness.  Urine is being cultured.  His prostate is enlarged without nodules.      Assessment & Plan:  Constipation-he eats out a lot.  He gets some exercise.  Cologuard ordered.  He may use MiraLAX daily and if he gets significantly constipated he can try bisacodyl.  Stool is guaiac negative today x1.  Urinary frequency likely due to BPH.  Rule out urinary tract infection.  Urine culture pending.  Try Flomax.  Memory loss-he continues to live alone and doing fairly well with family checking in on him.  He continues to drive.  Plan: He has felt relief after taking bisacodyl orally.  He should start on MiraLAX daily to prevent constipation.  He may continue with Myrbetriq.  I have prescribed Flomax today to see if that will help with his frequency.  He is to take that but I do not see it on his med list in Epic.  If he has persistent urinary issues he will be referred back to urologist.  Time spent reviewing chart, seeing patient, medical decision making is 30 minutes

## 2021-07-20 NOTE — Patient Instructions (Addendum)
Try Flomax daily. Urine culture ordered. Try Miralax daily for constipation. Call if not better in 2 weeks. Prostate exam is normal.  Use bisacodyl only as needed for significant constipation.

## 2021-07-20 NOTE — Telephone Encounter (Signed)
Robert Ramsey [phone]  [name] called to say he has been having frequent urination 5-6 times a day for couple of weeks, he did state during conversation that he had a urologist. He was going to call them and see if they could see him.

## 2021-07-20 NOTE — Telephone Encounter (Signed)
scheduled

## 2021-07-21 LAB — URINE CULTURE
MICRO NUMBER:: 12875466
SPECIMEN QUALITY:: ADEQUATE

## 2021-07-27 ENCOUNTER — Telehealth: Payer: Self-pay | Admitting: Internal Medicine

## 2021-07-27 DIAGNOSIS — R350 Frequency of micturition: Secondary | ICD-10-CM | POA: Diagnosis not present

## 2021-07-27 DIAGNOSIS — N401 Enlarged prostate with lower urinary tract symptoms: Secondary | ICD-10-CM | POA: Diagnosis not present

## 2021-07-27 NOTE — Telephone Encounter (Signed)
Called patient back and let him know to call Urologist and to follow back up with them, he verbalized understanding

## 2021-07-27 NOTE — Telephone Encounter (Signed)
Robert Ramsey [phone]  Makyle stop by to say the below medication is not working.  tamsulosin (FLOMAX) 0.4 MG CAPS capsule

## 2021-08-03 DIAGNOSIS — Z1211 Encounter for screening for malignant neoplasm of colon: Secondary | ICD-10-CM | POA: Diagnosis not present

## 2021-08-04 DIAGNOSIS — Z23 Encounter for immunization: Secondary | ICD-10-CM | POA: Diagnosis not present

## 2021-08-08 LAB — COLOGUARD: COLOGUARD: NEGATIVE

## 2021-08-12 ENCOUNTER — Ambulatory Visit (INDEPENDENT_AMBULATORY_CARE_PROVIDER_SITE_OTHER): Payer: Medicare Other

## 2021-08-12 DIAGNOSIS — H5712 Ocular pain, left eye: Secondary | ICD-10-CM | POA: Diagnosis not present

## 2021-08-12 DIAGNOSIS — S0502XA Injury of conjunctiva and corneal abrasion without foreign body, left eye, initial encounter: Secondary | ICD-10-CM | POA: Diagnosis not present

## 2021-08-12 DIAGNOSIS — I442 Atrioventricular block, complete: Secondary | ICD-10-CM

## 2021-08-12 DIAGNOSIS — H353221 Exudative age-related macular degeneration, left eye, with active choroidal neovascularization: Secondary | ICD-10-CM | POA: Diagnosis not present

## 2021-08-12 DIAGNOSIS — H11432 Conjunctival hyperemia, left eye: Secondary | ICD-10-CM | POA: Diagnosis not present

## 2021-08-14 ENCOUNTER — Telehealth: Payer: Self-pay | Admitting: Internal Medicine

## 2021-08-14 LAB — CUP PACEART REMOTE DEVICE CHECK
Battery Impedance: 1655 Ohm
Battery Remaining Longevity: 42 mo
Battery Voltage: 2.76 V
Brady Statistic AP VP Percent: 6 %
Brady Statistic AP VS Percent: 0 %
Brady Statistic AS VP Percent: 94 %
Brady Statistic AS VS Percent: 1 %
Date Time Interrogation Session: 20230209133432
Implantable Lead Implant Date: 20110517
Implantable Lead Implant Date: 20110517
Implantable Lead Location: 753859
Implantable Lead Location: 753860
Implantable Lead Model: 4469
Implantable Lead Model: 4470
Implantable Lead Serial Number: 543103
Implantable Lead Serial Number: 672341
Implantable Pulse Generator Implant Date: 20110517
Lead Channel Impedance Value: 601 Ohm
Lead Channel Impedance Value: 674 Ohm
Lead Channel Pacing Threshold Amplitude: 0.375 V
Lead Channel Pacing Threshold Amplitude: 0.5 V
Lead Channel Pacing Threshold Pulse Width: 0.4 ms
Lead Channel Pacing Threshold Pulse Width: 0.4 ms
Lead Channel Setting Pacing Amplitude: 2 V
Lead Channel Setting Pacing Amplitude: 2.5 V
Lead Channel Setting Pacing Pulse Width: 0.4 ms
Lead Channel Setting Sensing Sensitivity: 2 mV

## 2021-08-14 NOTE — Telephone Encounter (Signed)
Robert Ramsey [phone]  [name] called to say he had gotten notice from [facility] that his results was back, to contact his doctor so he was calling to inquire about that.

## 2021-08-14 NOTE — Telephone Encounter (Signed)
Patient notified that cologuard was negative.

## 2021-08-17 NOTE — Progress Notes (Signed)
Remote pacemaker transmission.   

## 2021-08-24 DIAGNOSIS — H353221 Exudative age-related macular degeneration, left eye, with active choroidal neovascularization: Secondary | ICD-10-CM | POA: Diagnosis not present

## 2021-09-01 NOTE — Progress Notes (Signed)
? ?Cardiology Office Note ?Date:  09/07/2021  ?Patient ID:  Robert Ramsey, Robert Ramsey [birth year], MRN [id number] ?PCP:  Robert Showers, MD  ?Electrophysiologist: Dr. Rayann Heman ? ?  ?Chief Complaint:  annual ? ?History of Present Illness: ?Robert Ramsey is a 81 y.o. male with history of LBBB, CHB w/PPM, HTN, HLD, OSA w/CPAP ? ?He comes in today to be seen for Dr. Rayann Heman, last seen by him Feb 2022, was doing well, no changes were made. ? ?TODAY ?He is doing very well. ?Lives independently and denies any difficulties with ADLs ?Has great family support, says his 4 daughters keep him well check in on. ?He no longer does his yard, but says he stays busy, not a couch potatoe! ?No CP, palpitations or cardiac awareness ?No  SOB ?No near syncope or syncope. ? ?His PMD does his labs ? ? ?Device information ?MDT dual chamber PPM implanted 11/18/2009 ? ? ?Past Medical History:  ?Diagnosis Date  ? Abdominal pain   ? Abnormal stress test 09/15/10  ? SEPTAL DEFECT SECONDARY TO PREVIOUS INFARCT VS LBBB; MILD SEPTAL ISCHEMIA  ? Acid reflux   ? Arthritis   ? CAD (coronary artery disease)   ? cath in 2007 reveals mild diffuse CAD  ? Colon polyps   ? Complete heart block [facility]) May 2011  ? has PTVP in place  ? Diverticulosis   ? Glucose intolerance (impaired glucose tolerance)   ? Hemorrhoids   ? Hiatal hernia   ? History of colonoscopy   ? HTN (hypertension)   ? Hypercholesterolemia   ? Memory impairment   ? better with lower dose statin  ? Pacemaker   ? Medtronic  ? PVC's (premature ventricular contractions)   ? Sinusitis   ? Sleep apnea   ? CPAP nightly  ? Ventral hernia   ? ? ?Past Surgical History:  ?Procedure Laterality Date  ? APPENDECTOMY    ? CARDIAC CATHETERIZATION  07/29/2005  ? EF 50-55%; Has mild diffuse CAD  ? cataract surgery  06/2009, 10/2014  ? Dr. Kathrin Penner  ? COLONOSCOPY    ? CPAP    ? EYE SURGERY    ? INSERT / REPLACE / REMOVE PACEMAKER  11/18/2009  ? w/ Dr. Doreatha Lew  ? LBBB    ? NOSE SURGERY    ? PACEMAKER INSERTION  11/18/09   ? MDT implanted by Dr Doreatha Lew  ? REFRACTIVE SURGERY Bilateral   ? right nephrectomy  01/2006  ? partial for mass  ? SHOULDER ARTHROSCOPY Right 08/22/2020  ? Procedure: RIGHT SHOULDER ARTHROSCOPY, DEBRIDEMENT, AND DECOMPRESSION;  Surgeon: Newt Minion, MD;  Location: [place];  Service: Orthopedics;  Laterality: Right;  ? TONSILLECTOMY    ? ? ?Current Outpatient Medications  ?Medication Sig Dispense Refill  ? aspirin 81 MG tablet Take 81 mg by mouth daily.    ? Carboxymethylcellul-Glycerin (LUBRICATING EYE DROPS OP) Place 1 drop into both eyes daily as needed (dry eyes).    ? ezetimibe (ZETIA) 10 MG tablet TAKE 1 TABLET BY MOUTH  DAILY 90 tablet 3  ? famotidine (PEPCID) 20 MG tablet Take 20 mg by mouth 2 (two) times daily.    ? losartan (COZAAR) 25 MG tablet TAKE 1 TABLET BY MOUTH  DAILY 90 tablet 3  ? Multiple Vitamin (MULTIVITAMIN) tablet Take 1 tablet by mouth daily after supper.    ? Multiple Vitamins-Minerals (PRESERVISION AREDS 2 PO) Take 1 tablet by mouth 2 (two) times daily.    ? MYRBETRIQ 50 MG TB24 tablet  Take 50 mg by mouth daily.    ? polyethylene glycol powder (GLYCOLAX/MIRALAX) 17 GM/SCOOP powder Take 17 g by mouth 2 (two) times daily as needed. 3350 g 1  ? rosuvastatin (CRESTOR) 5 MG tablet TAKE ONE-HALF TABLET BY  MOUTH 3 TIMES WEEKLY 20 tablet 3  ? sertraline (ZOLOFT) 25 MG tablet TAKE 1 TABLET BY MOUTH  DAILY 90 tablet 3  ? tamsulosin (FLOMAX) 0.4 MG CAPS capsule Take 1 capsule (0.4 mg total) by mouth daily. 90 each 3  ? ?No current facility-administered medications for this visit.  ? ? ?Allergies:   Cinnamon, Flexeril [cyclobenzaprine hcl], Levofloxacin, Nabumetone, Naproxen, Nsaids, Vantin, Vioxx [rofecoxib], Acyclovir and related, and Imodium [loperamide hcl]  ? ?Social History:  The patient  reports that he quit smoking about 38 years ago. His smoking use included cigarettes. He has never used smokeless tobacco. He reports that he does not drink alcohol and does not use drugs.  ? ?Family History:   The patient's family history includes Heart failure in his father; Leukemia in his mother. ? ?ROS:  Please see the history of present illness.    ?All other systems are reviewed and otherwise negative.  ? ?PHYSICAL EXAM:  ?VS:  BP (!) 148/78   Pulse 84   Ht 5\' 9"  (1.753 m)   Wt 201 lb 12.8 oz (91.5 kg)   SpO2 98%   BMI 29.80 kg/m?  BMI: Body mass index is 29.8 kg/m?. ?Well nourished, well developed, in no acute distress ?HEENT: normocephalic, atraumatic ?Neck: no JVD, carotid bruits or masses ?Cardiac:  RRR; no significant murmurs, no rubs, or gallops ?Lungs:  CTA b/l, no wheezing, rhonchi or rales ?Abd: soft, nontender ?MS: no deformity or atrophy ?Ext: trace edema, wearing a compression stocking on the R, has for years for some chronic RLE swelling ?Skin: warm and dry, no rash ?Neuro:  No gross deficits appreciated ?Psych: euthymic mood, full affect ? ?PPM site (R side) is stable, no tethering or discomfort ? ? ?EKG:  Done today and reviewed by myself shows  ?SR/VP 84bpm ? ?Device interrogation done today and reviewed by myself:  ?Battery and lead measurements are good ?3 HVR episodes, In the last 12 mo ? ? ?11/25/2005: TTE ?SUMMARY  ? -  Overall left ventricular systolic function was mildly decreased.  ?       Left ventricular ejection fraction was estimated , range  ?       being 40 % to 45 %. Doppler parameters were consistent with  ?       abnormal left ventricular relaxation; this left ventricular  ?       filling pattern is also seen with aging. There was moderate  ?       dyssynergic motion of the interventricular septum, consistent  ?       with a conduction abnormality or paced rhythm.  ? -  Aortic valve thickness was mildly increased.  ? ? ? ?Recent Labs: ?03/26/2021: ALT 12; BUN 24; Creat 1.16; Hemoglobin 13.8; Platelets 241; Potassium 4.7; Sodium 141  ?03/26/2021: Cholesterol 122; HDL 48; LDL Cholesterol (Calc) 58; Total CHOL/HDL Ratio 2.5; Triglycerides 75  ? ?CrCl cannot be calculated (Patient's  most recent lab result is older than the maximum 21 days allowed.).  ? ?Wt Readings from Last 3 Encounters:  ?09/07/21 201 lb 12.8 oz (91.5 kg)  ?07/20/21 195 lb (88.5 kg)  ?03/30/21 200 lb (90.7 kg)  ?  ? ?Other studies reviewed: ?Additional studies/records reviewed today include:  summarized above ? ?ASSESSMENT AND PLAN: ? ?PPM ?Intact function ?No programming changes made ? ?HTN ?A little high ?He will keep an eye on it at home, if regularly like today's will let us/his PMD know ? ?HLD ?Labs monitored and managed with his PMD ? ?4. CM ?Last echo was 2007 ?Trace edema, chronic to some degree for many years, R>L ?Will update his echo ?He is device dependent, VP all the time ?No symptoms of volume OL ? ?Disposition: F/u with remotes as usual, in clinic in 1 year, sooner if needed ? ?Current medicines are reviewed at length with the patient today.  The patient did not have any concerns regarding medicines. ? ?Signed, ?Tommye Standard, PA-C ?09/07/2021 10:53 AM    ? ?[facility] ?[address] ?[address] ?[place] [address] ?[phone] (office)  ?[phone] (fax) ? ? ?

## 2021-09-07 ENCOUNTER — Encounter: Payer: Self-pay | Admitting: Physician Assistant

## 2021-09-07 ENCOUNTER — Other Ambulatory Visit: Payer: Self-pay

## 2021-09-07 ENCOUNTER — Ambulatory Visit (INDEPENDENT_AMBULATORY_CARE_PROVIDER_SITE_OTHER): Payer: Medicare Other | Admitting: Physician Assistant

## 2021-09-07 VITALS — BP 148/78 | HR 84 | Ht 69.0 in | Wt 201.8 lb

## 2021-09-07 DIAGNOSIS — R609 Edema, unspecified: Secondary | ICD-10-CM | POA: Diagnosis not present

## 2021-09-07 DIAGNOSIS — I442 Atrioventricular block, complete: Secondary | ICD-10-CM

## 2021-09-07 DIAGNOSIS — I10 Essential (primary) hypertension: Secondary | ICD-10-CM

## 2021-09-07 DIAGNOSIS — Z950 Presence of cardiac pacemaker: Secondary | ICD-10-CM | POA: Diagnosis not present

## 2021-09-07 DIAGNOSIS — I429 Cardiomyopathy, unspecified: Secondary | ICD-10-CM | POA: Diagnosis not present

## 2021-09-07 LAB — CUP PACEART INCLINIC DEVICE CHECK
Battery Impedance: 1682 Ohm
Battery Remaining Longevity: 42 mo
Battery Voltage: 2.76 V
Brady Statistic AP VP Percent: 6 %
Brady Statistic AP VS Percent: 0 %
Brady Statistic AS VP Percent: 94 %
Brady Statistic AS VS Percent: 1 %
Date Time Interrogation Session: 20230306123214
Implantable Lead Implant Date: 20110517
Implantable Lead Implant Date: 20110517
Implantable Lead Location: 753859
Implantable Lead Location: 753860
Implantable Lead Model: 4469
Implantable Lead Model: 4470
Implantable Lead Serial Number: 543103
Implantable Lead Serial Number: 672341
Implantable Pulse Generator Implant Date: 20110517
Lead Channel Impedance Value: 611 Ohm
Lead Channel Impedance Value: 695 Ohm
Lead Channel Pacing Threshold Amplitude: 0.375 V
Lead Channel Pacing Threshold Amplitude: 0.5 V
Lead Channel Pacing Threshold Amplitude: 0.5 V
Lead Channel Pacing Threshold Amplitude: 0.75 V
Lead Channel Pacing Threshold Pulse Width: 0.4 ms
Lead Channel Pacing Threshold Pulse Width: 0.4 ms
Lead Channel Pacing Threshold Pulse Width: 0.4 ms
Lead Channel Pacing Threshold Pulse Width: 0.4 ms
Lead Channel Sensing Intrinsic Amplitude: 4 mV
Lead Channel Setting Pacing Amplitude: 2 V
Lead Channel Setting Pacing Amplitude: 2.5 V
Lead Channel Setting Pacing Pulse Width: 0.4 ms
Lead Channel Setting Sensing Sensitivity: 2 mV

## 2021-09-07 NOTE — Patient Instructions (Signed)
Medication Instructions:  ? ?Your physician recommends that you continue on your current medications as directed. Please refer to the Current Medication list given to you today. ? ?*If you need a refill on your cardiac medications before your next appointment, please call your pharmacy* ? ? ?Lab Work: [place] ? ? ?If you have labs (blood work) drawn today and your tests are completely normal, you will receive your results only by: ?MyChart Message (if you have MyChart) OR ?A paper copy in the mail ?If you have any lab test that is abnormal or we need to change your treatment, we will call you to review the results. ? ? ?Testing/Procedures:  Your physician has requested that you have an echocardiogram. Echocardiography is a painless test that uses sound waves to create images of your heart. It provides your doctor with information about the size and shape of your heart and how well your heart?s chambers and valves are working. This procedure takes approximately one hour. There are no restrictions for this procedure. ? ? ?Follow-Up: ?At [facility], you and your health needs are our priority.  As part of our continuing mission to provide you with exceptional heart care, we have created designated Provider Care Teams.  These Care Teams include your primary Cardiologist (physician) and Advanced Practice Providers (APPs -  Physician Assistants and Nurse Practitioners) who all work together to provide you with the care you need, when you need it. ? ?We recommend signing up for the patient portal called "MyChart".  Sign up information is provided on this After Visit Summary.  MyChart is used to connect with patients for Virtual Visits (Telemedicine).  Patients are able to view lab/test results, encounter notes, upcoming appointments, etc.  Non-urgent messages can be sent to your provider as well.   ?To learn more about what you can do with MyChart, go to [email or web address].   ? ?Your next appointment:    ?1 year(s) ? ?The format for your next appointment:   ?In Person ? ?Provider:   ?You may see   the following Advanced Practice Providers on your designated Care Team:   ?Tommye Standard, PA-C ? ? ? ?Other Instructions ? ?

## 2021-09-11 ENCOUNTER — Other Ambulatory Visit: Payer: Self-pay

## 2021-09-11 ENCOUNTER — Ambulatory Visit (HOSPITAL_COMMUNITY): Payer: Medicare Other | Attending: Cardiology

## 2021-09-11 DIAGNOSIS — R609 Edema, unspecified: Secondary | ICD-10-CM | POA: Diagnosis not present

## 2021-09-11 DIAGNOSIS — I429 Cardiomyopathy, unspecified: Secondary | ICD-10-CM

## 2021-09-11 DIAGNOSIS — I442 Atrioventricular block, complete: Secondary | ICD-10-CM | POA: Insufficient documentation

## 2021-09-11 LAB — ECHOCARDIOGRAM COMPLETE
P 1/2 time: 423 msec
S' Lateral: 3.2 cm

## 2021-09-14 ENCOUNTER — Other Ambulatory Visit: Payer: Self-pay | Admitting: *Deleted

## 2021-09-14 MED ORDER — METOPROLOL SUCCINATE ER 25 MG PO TB24: 25.0000 mg | ORAL_TABLET | Freq: Every day | ORAL | 2 refills | Status: DC

## 2021-09-16 ENCOUNTER — Other Ambulatory Visit: Payer: Self-pay | Admitting: *Deleted

## 2021-09-16 ENCOUNTER — Telehealth: Payer: Self-pay | Admitting: Internal Medicine

## 2021-09-16 ENCOUNTER — Telehealth: Payer: Self-pay | Admitting: *Deleted

## 2021-09-16 MED ORDER — METOPROLOL SUCCINATE ER 25 MG PO TB24: 25.0000 mg | ORAL_TABLET | Freq: Every day | ORAL | 2 refills | Status: DC

## 2021-09-16 NOTE — Telephone Encounter (Signed)
Spoke with patient about echocardiogram results and verbalized understanding with repeating results and recommendations. Patient asked if a copy of results could be given so that  his Dtr can also be aware in case she has any questions. Patient was told letter will left upfront for him to pick up. ?

## 2021-09-16 NOTE — Telephone Encounter (Signed)
Called [facility] and LVM that Dr Renold Genta had review Echocardiagram ?

## 2021-09-16 NOTE — Telephone Encounter (Signed)
Robert Ramsey ?[phone] ? ?Ygnacio stop by office with his Echo results and wanted to make sure you were aware of results. I let him know you would see them in Epic.  ?

## 2021-09-27 ENCOUNTER — Other Ambulatory Visit: Payer: Self-pay | Admitting: Internal Medicine

## 2021-09-30 ENCOUNTER — Telehealth: Payer: Self-pay | Admitting: Adult Health

## 2021-09-30 NOTE — Telephone Encounter (Signed)
Pt's daughter Sharyn Lull on [place] called wanting to inform provider that her along with her 3 sisters have notice a drastic change and decline in the pt's memory loss and agitation level. Sharyn Lull states that he gets very agitated when it is brought up to him or spoken about so she wanted to make sure the provider knew since she will be with him on the appt and would not be able to mention it in front of him. She does not mind the provider speaking to him about it but does not want him to know that it was called in by her. ?

## 2021-09-30 NOTE — Telephone Encounter (Signed)
I called and LMVM for daughter that I would forward to MMNP for appt tomorrow at 0830.  I stated if the changes were acute, that  sometimes may suggest underlying infection and see pcp.  She will all back if questions as needed.   ?

## 2021-10-01 ENCOUNTER — Encounter: Payer: Self-pay | Admitting: Adult Health

## 2021-10-01 ENCOUNTER — Ambulatory Visit (INDEPENDENT_AMBULATORY_CARE_PROVIDER_SITE_OTHER): Payer: Medicare Other | Admitting: Adult Health

## 2021-10-01 VITALS — BP 139/72 | HR 75 | Ht 70.0 in | Wt 201.4 lb

## 2021-10-01 DIAGNOSIS — Z9989 Dependence on other enabling machines and devices: Secondary | ICD-10-CM

## 2021-10-01 DIAGNOSIS — G4733 Obstructive sleep apnea (adult) (pediatric): Secondary | ICD-10-CM

## 2021-10-01 DIAGNOSIS — F03B0 Unspecified dementia, moderate, without behavioral disturbance, psychotic disturbance, mood disturbance, and anxiety: Secondary | ICD-10-CM | POA: Diagnosis not present

## 2021-10-01 MED ORDER — DONEPEZIL HCL 5 MG PO TABS: 5.0000 mg | ORAL_TABLET | Freq: Every day | ORAL | 5 refills | Status: DC

## 2021-10-01 NOTE — Patient Instructions (Signed)
Start Aricept 5 mg at bedtime if tolerating after 1 month can increase to 10 mg daily at bedtime ?In the future we can consider adding on Namenda ?I have attached some information about Aricept ?

## 2021-10-01 NOTE — Progress Notes (Signed)
? ? ?PATIENT: Robert Ramsey ?DOB: April 18, 1941 ? ?REASON FOR VISIT: follow up ?HISTORY FROM: patient ? ?Chief Complaint  ?Patient presents with  ? Follow-up  ?  Pt in  9  with daughter . Pt is her for CPAP follow up  Pt has no concerns ot questions for today's   ? ? ? ?HISTORY OF PRESENT ILLNESS: ?Today 10/01/21: ? ?[name] is an 81 year old male with a history of OSA on CPAP and memory disturbance. He returns today for followup. Here with his Daughter ? ?Memory: Feels that memory is about the same. Lives home alone with his cat. Able to complete all ADLs independently. Manages his own medication- uses a checklist. Keeps up with his own appointments and finances. He does not feel like there has been any big changes with his mood or behavior. Daughter feels that he gets more frustrated when he can't remember things. Discussing with his family about assistant living or retirement home. ? ? ? ?10/01/20: Mr.Rothgeb is an 81 year old male with a history of obstructive sleep apnea on CPAP and memory disturbance.  He returns today for follow-up.  His CPAP download shows excellent compliance.  Denies any issues with the CPAP.  Feels that his memory has remained relatively stable.  He lives at home alone.  His daughters check on him frequently and help him with his meals.  He is able to complete all ADLs independently.  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  Returns today for an evaluation. ? ? ? ?04/02/20:[name] is an 81 year old male with a history of memory disturbance and obstructive sleep apnea.  He returns today for follow-up on his memory.  He feels that overall things are stable.  He continues to live at home alone.  He does have a cat there.  He is able to complete all ADLs independently.  He completes all household chores independently.  His daughters provide his meals.  He denies any trouble sleeping.  Continues to use CPAP consistently.  Overall he feels that he is doing well. ? ?HISTORY 10/01/19: ?   ?[name] is an 81 year old male with a history of obstructive sleep apnea on CPAP.  His download indicates that he uses machine nightly for compliance of 100%.  He uses machine greater than 4 hours 29 days for compliance of 97%.  On average he uses his machine 8 hours and 15 minutes.  His residual AHI is 0.4 on 9 cm of water with EPR 3.  Leak in the 95th percentile is 13 L/min he reports that the CPAP is working well for him.   ?  ?He feels that his memory has remained stable.  He currently lives at home alone.  He is able to complete all ADLs independently.  He operates a Teacher, music without difficulty.  He is able to manage his own finances.  His daughter reports that he is repetitive other than that he has been doing well. ?  ?He returns today for an evaluation. ? ?REVIEW OF SYSTEMS: Out of a complete 14 system review of symptoms, the patient complains only of the following symptoms, and all other reviewed systems are negative. ? ?See HPI ? ?ALLERGIES: ?Allergies  ?Allergen Reactions  ? Cinnamon Other (See Comments)  ?  Cinnamon toothpaste caused him to break out inside his mouth and lesions were removed  ? Flexeril [Cyclobenzaprine Hcl] Other (See Comments)  ?  Possible heart side effects  ? Levofloxacin Other (See Comments)  ?  Elevated heart rate  ? Nabumetone Other (See Comments)  ?  Heart issues/hospital admission  ? Naproxen Other (See Comments)  ?  Effects blood pressure and heart  ? Nsaids Other (See Comments)  ?  Effects blood pressure and heart  ? Vantin Other (See Comments)  ?  Pseudo colitis  ? Vioxx [Rofecoxib] Other (See Comments)  ?  Effects blood pressure and heart  ? Acyclovir And Related   ?  Unknown reaction  ? Imodium [Loperamide Hcl]   ?  unknown  ? ? ?HOME MEDICATIONS: ?Outpatient Medications Prior to Visit  ?Medication Sig Dispense Refill  ? aspirin 81 MG tablet Take 81 mg by mouth daily.    ? Carboxymethylcellul-Glycerin (LUBRICATING EYE DROPS OP) Place 1 drop into both eyes daily  as needed (dry eyes).    ? ezetimibe (ZETIA) 10 MG tablet TAKE 1 TABLET BY MOUTH  DAILY 90 tablet 3  ? famotidine (PEPCID) 20 MG tablet Take 20 mg by mouth 2 (two) times daily.    ? losartan (COZAAR) 25 MG tablet TAKE 1 TABLET BY MOUTH  DAILY 90 tablet 3  ? metoprolol succinate (TOPROL XL) 25 MG 24 hr tablet Take 1 tablet (25 mg total) by mouth daily. 90 tablet 2  ? Multiple Vitamin (MULTIVITAMIN) tablet Take 1 tablet by mouth daily after supper.    ? Multiple Vitamins-Minerals (PRESERVISION AREDS 2 PO) Take 1 tablet by mouth 2 (two) times daily.    ? MYRBETRIQ 50 MG TB24 tablet Take 50 mg by mouth daily.    ? polyethylene glycol powder (GLYCOLAX/MIRALAX) 17 GM/SCOOP powder Take 17 g by mouth 2 (two) times daily as needed. 3350 g 1  ? rosuvastatin (CRESTOR) 5 MG tablet TAKE ONE-HALF TABLET BY  MOUTH 3 TIMES WEEKLY 20 tablet 3  ? sertraline (ZOLOFT) 25 MG tablet TAKE 1 TABLET BY MOUTH  DAILY 90 tablet 3  ? tamsulosin (FLOMAX) 0.4 MG CAPS capsule Take 1 capsule (0.4 mg total) by mouth daily. 90 each 3  ? ?No facility-administered medications prior to visit.  ? ? ?PAST MEDICAL HISTORY: ?Past Medical History:  ?Diagnosis Date  ? Abdominal pain   ? Abnormal stress test 09/15/10  ? SEPTAL DEFECT SECONDARY TO PREVIOUS INFARCT VS LBBB; MILD SEPTAL ISCHEMIA  ? Acid reflux   ? Arthritis   ? CAD (coronary artery disease)   ? cath in 2007 reveals mild diffuse CAD  ? Colon polyps   ? Complete heart block [facility]) May 2011  ? has PTVP in place  ? Diverticulosis   ? Glucose intolerance (impaired glucose tolerance)   ? Hemorrhoids   ? Hiatal hernia   ? History of colonoscopy   ? HTN (hypertension)   ? Hypercholesterolemia   ? Memory impairment   ? better with lower dose statin  ? Pacemaker   ? Medtronic  ? PVC's (premature ventricular contractions)   ? Sinusitis   ? Sleep apnea   ? CPAP nightly  ? Ventral hernia   ? ? ?PAST SURGICAL HISTORY: ?Past Surgical History:  ?Procedure Laterality Date  ? APPENDECTOMY    ? CARDIAC  CATHETERIZATION  07/29/2005  ? EF 50-55%; Has mild diffuse CAD  ? cataract surgery  06/2009, 10/2014  ? Dr. Kathrin Penner  ? COLONOSCOPY    ? CPAP    ? EYE SURGERY    ? INSERT / REPLACE / REMOVE PACEMAKER  11/18/2009  ? w/ Dr. Doreatha Lew  ? LBBB    ? NOSE SURGERY    ?  PACEMAKER INSERTION  11/18/09  ? MDT implanted by Dr Doreatha Lew  ? REFRACTIVE SURGERY Bilateral   ? right nephrectomy  01/2006  ? partial for mass  ? SHOULDER ARTHROSCOPY Right 08/22/2020  ? Procedure: RIGHT SHOULDER ARTHROSCOPY, DEBRIDEMENT, AND DECOMPRESSION;  Surgeon: Newt Minion, MD;  Location: [place];  Service: Orthopedics;  Laterality: Right;  ? TONSILLECTOMY    ? ? ?FAMILY HISTORY: ?Family History  ?Problem Relation Age of Onset  ? Heart failure Father   ? Leukemia Mother   ? ? ?SOCIAL HISTORY: ?Social History  ? ?Socioeconomic History  ? Marital status: Married  ?  Spouse name: [name]  ? Number of children: 4  ? Years of education: 53  ? Highest education level: Not on file  ?Occupational History  ? Occupation: Retired  ?  Comment: [employer]  ?Tobacco Use  ? Smoking status: Former  ?  Types: Cigarettes  ?  Quit date: 07/06/1983  ?  Years since quitting: 38.2  ? Smokeless tobacco: Never  ? Tobacco comments:  ?  stopped smoking in 1985  ?Vaping Use  ? Vaping Use: Never used  ?Substance and Sexual Activity  ? Alcohol use: No  ? Drug use: No  ? Sexual activity: Not Currently  ?Other Topics Concern  ? Not on file  ?Social History Narrative  ? Retired from [employer], [employer].  ? Lives at home with wife  ? Caffeine use- morning coffee and 2 cup/ 1 cup of tea at lunch   ? ?Social Determinants of Health  ? ?Financial Resource Strain: Not on file  ?Food Insecurity: Not on file  ?Transportation Needs: Not on file  ?Physical Activity: Not on file  ?Stress: Not on file  ?Social Connections: Not on file  ?Intimate Partner Violence: Not on file  ? ? ? ? ?PHYSICAL EXAM ? ?Vitals:  ? 10/01/21 0832  ?BP: 139/72  ?Pulse: 75  ?Weight: 201 lb 6.4 oz (91.4 kg)  ?Height: '5\' 10"'$   (1.778 m)  ? ? ?Body mass index is 28.9 kg/m?.  ? ? ?  10/01/2021  ?  8:40 AM 10/01/2020  ?  9:05 AM 04/02/2020  ?  8:59 AM  ?MMSE - Mini Mental State Exam  ?Orientation to time '3 4 4  '$ ?Orientation to Place '4 5 5  '$ ?Re

## 2021-10-02 DIAGNOSIS — Z20822 Contact with and (suspected) exposure to covid-19: Secondary | ICD-10-CM | POA: Diagnosis not present

## 2021-10-05 DIAGNOSIS — H353221 Exudative age-related macular degeneration, left eye, with active choroidal neovascularization: Secondary | ICD-10-CM | POA: Diagnosis not present

## 2021-10-12 ENCOUNTER — Telehealth: Payer: Self-pay | Admitting: Adult Health

## 2021-10-12 MED ORDER — MEMANTINE HCL 10 MG PO TABS: ORAL_TABLET | ORAL | 5 refills | Status: DC

## 2021-10-12 MED ORDER — MEMANTINE HCL 5 MG PO TABS: 5.0000 mg | ORAL_TABLET | Freq: Two times a day (BID) | ORAL | 0 refills | Status: DC

## 2021-10-12 NOTE — Telephone Encounter (Signed)
Pt's daughter Sharyn [place]ull on [place] called and LVM stating that the pt is not able to tolerate the donepezil (ARICEPT) 5 MG tablet due to "it messing up his stomach" so the pt has stop taking the medication and is wanting to know if the other medication that was offered in the last OV can be started in place of th edonepezil (ARICEPT) 5 MG tablet ?Please call daughter back instead of pt. ?

## 2021-10-12 NOTE — Telephone Encounter (Signed)
It may be better to start [place] now and see if the patient can tolerate Aricept / Donepizil in the future better  when " primed " on Namenda.  ?I will write for a starter dose of namenda 50 mg bid and after 14 days ( 28 doses) the patient is to go to 10 mg bid.  ?Can stay off Aricept for now, please don't discard the medication yet   ?

## 2021-10-12 NOTE — Telephone Encounter (Signed)
Sender was Romelle Starcher, not Nurse Mardene Celeste- I corrected this.  ?

## 2021-10-12 NOTE — Addendum Note (Signed)
Addended by: Larey Seat on: 10/12/2021 06:13 PM ? ? Modules accepted: Orders ? ?

## 2021-10-13 NOTE — Telephone Encounter (Signed)
I called the daughter Sharyn Lull (on [place]) and LVM asking for call back to discuss her medication questions and prescription.  ?

## 2021-10-13 NOTE — Telephone Encounter (Addendum)
I spoke with Sharyn Lull and we discussed the new prescription for the Namenda provided by Dr. Brett Fairy.  She understands the patient will start with a 5 mg dose twice a day for 14 days and provided he does well he will increase to the maintenance dose of 10 mg twice a day.  She understands both prescriptions were sent to the pharmacy.  She will discuss this with patient.  She will also hold on to his Aricept in case that is started at a later date.  She will call in the meantime with any questions.  I also suggested that she let the patient's bowel habits return to normal from where the Aricept affected him before starting the [place].  She verbalized appreciation for the call. ?

## 2021-11-06 DIAGNOSIS — Z20822 Contact with and (suspected) exposure to covid-19: Secondary | ICD-10-CM | POA: Diagnosis not present

## 2021-11-11 ENCOUNTER — Ambulatory Visit (INDEPENDENT_AMBULATORY_CARE_PROVIDER_SITE_OTHER): Payer: Medicare Other

## 2021-11-11 DIAGNOSIS — I442 Atrioventricular block, complete: Secondary | ICD-10-CM | POA: Diagnosis not present

## 2021-11-11 LAB — CUP PACEART REMOTE DEVICE CHECK
Battery Impedance: 1851 Ohm
Battery Remaining Longevity: 38 mo
Battery Voltage: 2.75 V
Brady Statistic AP VP Percent: 20 %
Brady Statistic AP VS Percent: 0 %
Brady Statistic AS VP Percent: 80 %
Brady Statistic AS VS Percent: 0 %
Date Time Interrogation Session: 20230510070707
Implantable Lead Implant Date: 20110517
Implantable Lead Implant Date: 20110517
Implantable Lead Location: 753859
Implantable Lead Location: 753860
Implantable Lead Model: 4469
Implantable Lead Model: 4470
Implantable Lead Serial Number: 543103
Implantable Lead Serial Number: 672341
Implantable Pulse Generator Implant Date: 20110517
Lead Channel Impedance Value: 622 Ohm
Lead Channel Impedance Value: 721 Ohm
Lead Channel Pacing Threshold Amplitude: 0.375 V
Lead Channel Pacing Threshold Amplitude: 0.5 V
Lead Channel Pacing Threshold Pulse Width: 0.4 ms
Lead Channel Pacing Threshold Pulse Width: 0.4 ms
Lead Channel Setting Pacing Amplitude: 2 V
Lead Channel Setting Pacing Amplitude: 2.5 V
Lead Channel Setting Pacing Pulse Width: 0.4 ms
Lead Channel Setting Sensing Sensitivity: 2 mV

## 2021-11-16 DIAGNOSIS — H353221 Exudative age-related macular degeneration, left eye, with active choroidal neovascularization: Secondary | ICD-10-CM | POA: Diagnosis not present

## 2021-11-19 NOTE — Progress Notes (Signed)
Remote pacemaker transmission.   

## 2021-12-14 ENCOUNTER — Other Ambulatory Visit: Payer: Self-pay

## 2021-12-17 NOTE — Progress Notes (Signed)
Cardiology Office Note Date:  12/17/2021  Patient ID:  Robert Ramsey, Robert Ramsey [birth year], MRN [id number] PCP:  Elby Showers, MD  Electrophysiologist: Dr. Rayann Heman    Chief Complaint:   3 mo  History of Present Illness: Robert Ramsey is a 81 y.o. male with history of LBBB, CHB w/PPM, HTN, HLD, OSA w/CPAP  He comes in today to be seen for Dr. Rayann Heman, last seen by him Feb 2022, was doing well, no changes were made.  I saw him March 2023, He is doing very well. Lives independently and denies any difficulties with ADLs Has great family support, says his 4 daughters keep him well check in on. He no longer does his yard, but says he stays busy, not a couch potatoe! No CP, palpitations or cardiac awareness No  SOB No near syncope or syncope. His PMD does his labs Some degree of chronic edema, planned to update his echo given had been since 2007 and RV pacing 100%  LVEF remained 45%, Toprol added with plans to see him back in 3-21mo TODAY He is doing very well. No formal exercise but stays very busy and on the g. No exertional intolerances or difficulties with his afls He is very happy with how he is doing and feels. Sleeps well and gets around "good" No CP, palpitations or cardiac awareness No dizzy spells, near syncope or syncope. No SOB, DOE. Memory is not as good as it once was.   Device information MDT dual chamber PPM implanted 11/18/2009   Past Medical History:  Diagnosis Date   Abdominal pain    Abnormal stress test 09/15/10   SEPTAL DEFECT SECONDARY TO PREVIOUS INFARCT VS LBBB; MILD SEPTAL ISCHEMIA   Acid reflux    Arthritis    CAD (coronary artery disease)    cath in 2007 reveals mild diffuse CAD   Colon polyps    Complete heart block ([facility] May 2011   has PTVP in place   Diverticulosis    Glucose intolerance (impaired glucose tolerance)    Hemorrhoids    Hiatal hernia    History of colonoscopy    HTN (hypertension)    Hypercholesterolemia    Memory  impairment    better with lower dose statin   Pacemaker    Medtronic   PVC's (premature ventricular contractions)    Sinusitis    Sleep apnea    CPAP nightly   Ventral hernia     Past Surgical History:  Procedure Laterality Date   APPENDECTOMY     CARDIAC CATHETERIZATION  07/29/2005   EF 50-55%; Has mild diffuse CAD   cataract surgery  06/2009, 10/2014   Dr. SKathrin Penner  COLONOSCOPY     CPAP     EYE SURGERY     INSERT / REPLACE / REMOVE PACEMAKER  11/18/2009   w/ Dr. TDoreatha Lew  LBBB     NOSE SURGERY     PACEMAKER INSERTION  11/18/09   MDT implanted by Dr TDoreatha Lew  REFRACTIVE SURGERY Bilateral    right nephrectomy  01/2006   partial for mass   SHOULDER ARTHROSCOPY Right 08/22/2020   Procedure: RIGHT SHOULDER ARTHROSCOPY, DEBRIDEMENT, AND DECOMPRESSION;  Surgeon: DNewt Minion MD;  [facility]  Service: Orthopedics;  Laterality: Right;   TONSILLECTOMY      Current Outpatient Medications  Medication Sig Dispense Refill   aspirin 81 MG tablet Take 81 mg by mouth daily.     Carboxymethylcellul-Glycerin (LUBRICATING EYE DROPS OP) Place  1 drop into both eyes daily as needed (dry eyes).     donepezil (ARICEPT) 5 MG tablet Take 1 tablet (5 mg total) by mouth at bedtime. 30 tablet 5   ezetimibe (ZETIA) 10 MG tablet TAKE 1 TABLET BY MOUTH  DAILY 90 tablet 3   famotidine (PEPCID) 20 MG tablet Take 20 mg by mouth 2 (two) times daily.     losartan (COZAAR) 25 MG tablet TAKE 1 TABLET BY MOUTH  DAILY 90 tablet 3   memantine (NAMENDA) 10 MG tablet Following 14 days on 5 mg bid this will be the next step. Take 10 mg twice a day po. 60 tablet 5   memantine (NAMENDA) 5 MG tablet Take 1 tablet (5 mg total) by mouth 2 (two) times daily. 28 tablet 0   metoprolol succinate (TOPROL XL) 25 MG 24 hr tablet Take 1 tablet (25 mg total) by mouth daily. 90 tablet 2   Multiple Vitamin (MULTIVITAMIN) tablet Take 1 tablet by mouth daily after supper.     Multiple Vitamins-Minerals (PRESERVISION AREDS  2 PO) Take 1 tablet by mouth 2 (two) times daily.     MYRBETRIQ 50 MG TB24 tablet Take 50 mg by mouth daily.     polyethylene glycol powder (GLYCOLAX/MIRALAX) 17 GM/SCOOP powder Take 17 g by mouth 2 (two) times daily as needed. 3350 g 1   rosuvastatin (CRESTOR) 5 MG tablet TAKE ONE-HALF TABLET BY  MOUTH 3 TIMES WEEKLY 20 tablet 3   sertraline (ZOLOFT) 25 MG tablet TAKE 1 TABLET BY MOUTH  DAILY 90 tablet 3   tamsulosin (FLOMAX) 0.4 MG CAPS capsule Take 1 capsule (0.4 mg total) by mouth daily. 90 each 3   No current facility-administered medications for this visit.    Allergies:   Cinnamon, Flexeril [cyclobenzaprine hcl], Levofloxacin, Nabumetone, Naproxen, Nsaids, Vantin, Vioxx [rofecoxib], Acyclovir and related, and Imodium [loperamide hcl]   Social History:  The patient  reports that he quit smoking about 38 years ago. His smoking use included cigarettes. He has never used smokeless tobacco. He reports that he does not drink alcohol and does not use drugs.   Family History:  The patient's family history includes Heart failure in his father; Leukemia in his mother.  ROS:  Please see the history of present illness.    All other systems are reviewed and otherwise negative.   PHYSICAL EXAM:  VS:  There were no vitals taken for this visit. BMI: There is no height or weight on file to calculate BMI. Well nourished, well developed, in no acute distress HEENT: normocephalic, atraumatic Neck: no JVD, carotid bruits or masses Cardiac:   RRR; no significant murmurs, no rubs, or gallops Lungs:  CTA b/l, no wheezing, rhonchi or rales Abd: soft, nontender MS: no deformity or atrophy Ext: trace edema, wearing a compression stocking on the R, has for years for some chronic RLE swelling Skin: warm and dry, no rash Neuro:  No gross deficits appreciated Psych: euthymic mood, full affect  PPM site (R side) is stable, no tethering or discomfort   EKG:  not done today  Device interrogation done  today and reviewed by myself:  Battery and lead measurements are good No arrhythmias  09/11/21: TTE  1. Left ventricular ejection fraction by 3D volume is 45 %. The left  ventricle has mildly decreased function. The left ventricle demonstrates  global hypokinesis. Left ventricular diastolic function could not be  evaluated.   2. Right ventricular systolic function is normal. The right ventricular  size is normal. There is normal pulmonary artery systolic pressure.   3. The mitral valve is normal in structure. Mild mitral valve  regurgitation. No evidence of mitral stenosis.   4. The aortic valve is normal in structure. Aortic valve regurgitation is  mild. No aortic stenosis is present.   5. The inferior vena cava is normal in size with greater than 50%  respiratory variability, suggesting right atrial pressure of 3 mmHg.    11/25/2005: TTE SUMMARY   -  Overall left ventricular systolic function was mildly decreased.         Left ventricular ejection fraction was estimated , range         being 40 % to 45 %. Doppler parameters were consistent with         abnormal left ventricular relaxation; this left ventricular         filling pattern is also seen with aging. There was moderate         dyssynergic motion of the interventricular septum, consistent         with a conduction abnormality or paced rhythm.   -  Aortic valve thickness was mildly increased.     Recent Labs: 03/26/2021: ALT 12; BUN 24; Creat 1.16; Hemoglobin 13.8; Platelets 241; Potassium 4.7; Sodium 141  03/26/2021: Cholesterol 122; HDL 48; LDL Cholesterol (Calc) 58; Total CHOL/HDL Ratio 2.5; Triglycerides 75   CrCl cannot be calculated (Patient's most recent lab result is older than the maximum 21 days allowed.).   Wt Readings from Last 3 Encounters:  10/01/21 201 lb 6.4 oz (91.4 kg)  09/07/21 201 lb 12.8 oz (91.5 kg)  07/20/21 195 lb (88.5 kg)     Other studies reviewed: Additional studies/records reviewed today  include: summarized above  ASSESSMENT AND PLAN:  PPM Intact function No programming changes made  HTN Recheck is 130/70 No changes  HLD Labs monitored and managed with his PMD  4. CM Last echo was 2007 EF remains unchanged No symptoms of volume OL He is not inclined to make any changes. Given the stability of his CM, and no symptoms, no ischemic w/u Will not advance meds at this time.   Disposition: F/u with remotes as usual, he would like to come back in 6 mo  Current medicines are reviewed at length with the patient today.  The patient did not have any concerns regarding medicines.  Venetia Night, PA-C 12/17/2021 7:52 AM     [facility] [place] [address] [phone] (office)  [phone] (fax)

## 2021-12-18 ENCOUNTER — Encounter: Payer: Self-pay | Admitting: Physician Assistant

## 2021-12-18 ENCOUNTER — Ambulatory Visit (INDEPENDENT_AMBULATORY_CARE_PROVIDER_SITE_OTHER): Payer: Medicare Other | Admitting: Physician Assistant

## 2021-12-18 VITALS — BP 152/80 | HR 90 | Ht 69.0 in | Wt 200.0 lb

## 2021-12-18 DIAGNOSIS — Z950 Presence of cardiac pacemaker: Secondary | ICD-10-CM

## 2021-12-18 DIAGNOSIS — I10 Essential (primary) hypertension: Secondary | ICD-10-CM | POA: Diagnosis not present

## 2021-12-18 DIAGNOSIS — I429 Cardiomyopathy, unspecified: Secondary | ICD-10-CM

## 2021-12-18 DIAGNOSIS — I442 Atrioventricular block, complete: Secondary | ICD-10-CM | POA: Diagnosis not present

## 2021-12-18 LAB — CUP PACEART INCLINIC DEVICE CHECK
Battery Impedance: 1823 Ohm
Battery Remaining Longevity: 38 mo
Battery Voltage: 2.76 V
Brady Statistic AP VP Percent: 19 %
Brady Statistic AP VS Percent: 0 %
Brady Statistic AS VP Percent: 80 %
Brady Statistic AS VS Percent: 0 %
Date Time Interrogation Session: 20230616105544
Implantable Lead Implant Date: 20110517
Implantable Lead Implant Date: 20110517
Implantable Lead Location: 753859
Implantable Lead Location: 753860
Implantable Lead Model: 4469
Implantable Lead Model: 4470
Implantable Lead Serial Number: 543103
Implantable Lead Serial Number: 672341
Implantable Pulse Generator Implant Date: 20110517
Lead Channel Impedance Value: 631 Ohm
Lead Channel Impedance Value: 688 Ohm
Lead Channel Pacing Threshold Amplitude: 0.375 V
Lead Channel Pacing Threshold Amplitude: 0.5 V
Lead Channel Pacing Threshold Amplitude: 0.5 V
Lead Channel Pacing Threshold Amplitude: 0.5 V
Lead Channel Pacing Threshold Pulse Width: 0.4 ms
Lead Channel Pacing Threshold Pulse Width: 0.4 ms
Lead Channel Pacing Threshold Pulse Width: 0.4 ms
Lead Channel Pacing Threshold Pulse Width: 0.4 ms
Lead Channel Sensing Intrinsic Amplitude: 2.8 mV
Lead Channel Setting Pacing Amplitude: 2 V
Lead Channel Setting Pacing Amplitude: 2.5 V
Lead Channel Setting Pacing Pulse Width: 0.4 ms
Lead Channel Setting Sensing Sensitivity: 2 mV

## 2021-12-18 NOTE — Patient Instructions (Signed)
Medication Instructions:   Your physician recommends that you continue on your current medications as directed. Please refer to the Current Medication list given to you today.   *If you need a refill on your cardiac medications before your next appointment, please call your pharmacy*   Lab Work: [place]    If you have labs (blood work) drawn today and your tests are completely normal, you will receive your results only by: [place] (if you have MyChart) OR A paper copy in the mail If you have any lab test that is abnormal or we need to change your treatment, we will call you to review the results.   Testing/Procedures: NONE ORDERED  TODAY    Follow-Up: At [facility], you and your health needs are our priority.  As part of our continuing mission to provide you with exceptional heart care, we have created designated Provider Care Teams.  These Care Teams include your primary Cardiologist (physician) and Advanced Practice Providers (APPs -  Physician Assistants and Nurse Practitioners) who all work together to provide you with the care you need, when you need it.  We recommend signing up for the patient portal called "MyChart".  Sign up information is provided on this After Visit Summary.  MyChart is used to connect with patients for Virtual Visits (Telemedicine).  Patients are able to view lab/test results, encounter notes, upcoming appointments, etc.  Non-urgent messages can be sent to your provider as well.   To learn more about what you can do with MyChart, go to [email or web address].    Your next appointment:   6 month(s)  The format for your next appointment:   In Person  Provider:   Tommye Standard, PA-C    Other Instructions   Important Information About Sugar

## 2021-12-19 ENCOUNTER — Other Ambulatory Visit: Payer: Self-pay | Admitting: Internal Medicine

## 2021-12-28 DIAGNOSIS — H353221 Exudative age-related macular degeneration, left eye, with active choroidal neovascularization: Secondary | ICD-10-CM | POA: Diagnosis not present

## 2022-02-01 DIAGNOSIS — H353221 Exudative age-related macular degeneration, left eye, with active choroidal neovascularization: Secondary | ICD-10-CM | POA: Diagnosis not present

## 2022-02-10 ENCOUNTER — Ambulatory Visit (INDEPENDENT_AMBULATORY_CARE_PROVIDER_SITE_OTHER): Payer: Medicare Other

## 2022-02-10 DIAGNOSIS — I442 Atrioventricular block, complete: Secondary | ICD-10-CM | POA: Diagnosis not present

## 2022-02-10 LAB — CUP PACEART REMOTE DEVICE CHECK
Battery Impedance: 2000 Ohm
Battery Remaining Longevity: 35 mo
Battery Voltage: 2.74 V
Brady Statistic AP VP Percent: 21 %
Brady Statistic AP VS Percent: 0 %
Brady Statistic AS VP Percent: 79 %
Brady Statistic AS VS Percent: 0 %
Date Time Interrogation Session: 20230809074409
Implantable Lead Implant Date: 20110517
Implantable Lead Implant Date: 20110517
Implantable Lead Location: 753859
Implantable Lead Location: 753860
Implantable Lead Model: 4469
Implantable Lead Model: 4470
Implantable Lead Serial Number: 543103
Implantable Lead Serial Number: 672341
Implantable Pulse Generator Implant Date: 20110517
Lead Channel Impedance Value: 631 Ohm
Lead Channel Impedance Value: 678 Ohm
Lead Channel Pacing Threshold Amplitude: 0.375 V
Lead Channel Pacing Threshold Amplitude: 0.5 V
Lead Channel Pacing Threshold Pulse Width: 0.4 ms
Lead Channel Pacing Threshold Pulse Width: 0.4 ms
Lead Channel Setting Pacing Amplitude: 2 V
Lead Channel Setting Pacing Amplitude: 2.5 V
Lead Channel Setting Pacing Pulse Width: 0.4 ms
Lead Channel Setting Sensing Sensitivity: 2 mV

## 2022-02-11 DIAGNOSIS — L821 Other seborrheic keratosis: Secondary | ICD-10-CM | POA: Diagnosis not present

## 2022-02-13 ENCOUNTER — Other Ambulatory Visit: Payer: Self-pay | Admitting: Internal Medicine

## 2022-03-04 DIAGNOSIS — H353221 Exudative age-related macular degeneration, left eye, with active choroidal neovascularization: Secondary | ICD-10-CM | POA: Diagnosis not present

## 2022-03-09 ENCOUNTER — Telehealth: Payer: Self-pay | Admitting: Internal Medicine

## 2022-03-09 ENCOUNTER — Other Ambulatory Visit: Payer: Self-pay

## 2022-03-09 ENCOUNTER — Encounter: Payer: Self-pay | Admitting: Internal Medicine

## 2022-03-09 MED ORDER — SERTRALINE HCL 25 MG PO TABS: 25.0000 mg | ORAL_TABLET | Freq: Every day | ORAL | 3 refills | Status: DC

## 2022-03-09 NOTE — Telephone Encounter (Signed)
Refilled Zoloft for one year. Has upcoming CPE appt. MJB, MD

## 2022-03-09 NOTE — Telephone Encounter (Signed)
Received Fax RX request from  [facility] - [place], [place] Phone:  [phone]  Fax:  [phone]      Medication - Sertraline HCL Tab   Last Refill - sertraline (ZOLOFT) 25 MG tablet -- RX wrote 02/02/2021 with 3 refills   Last OV - 07/20/21  Last CPE - 03/30/22  Next Appointment - 04/01/22

## 2022-03-11 MED ORDER — SERTRALINE HCL 25 MG PO TABS: 25.0000 mg | ORAL_TABLET | Freq: Every day | ORAL | 3 refills | Status: DC

## 2022-03-11 NOTE — Addendum Note (Signed)
Addended by: Geradine Girt D on: 03/11/2022 03:42 PM   Modules accepted: Orders

## 2022-03-11 NOTE — Telephone Encounter (Signed)
Rx sent to the pharmacy.

## 2022-03-15 NOTE — Progress Notes (Signed)
Remote pacemaker transmission.   

## 2022-03-18 DIAGNOSIS — L309 Dermatitis, unspecified: Secondary | ICD-10-CM | POA: Diagnosis not present

## 2022-03-30 ENCOUNTER — Other Ambulatory Visit: Payer: Medicare Other

## 2022-03-30 DIAGNOSIS — Z125 Encounter for screening for malignant neoplasm of prostate: Secondary | ICD-10-CM

## 2022-03-30 DIAGNOSIS — R3915 Urgency of urination: Secondary | ICD-10-CM | POA: Diagnosis not present

## 2022-03-30 DIAGNOSIS — R7302 Impaired glucose tolerance (oral): Secondary | ICD-10-CM | POA: Diagnosis not present

## 2022-03-30 DIAGNOSIS — I442 Atrioventricular block, complete: Secondary | ICD-10-CM | POA: Diagnosis not present

## 2022-03-30 DIAGNOSIS — I10 Essential (primary) hypertension: Secondary | ICD-10-CM | POA: Diagnosis not present

## 2022-03-30 DIAGNOSIS — E7849 Other hyperlipidemia: Secondary | ICD-10-CM | POA: Diagnosis not present

## 2022-03-31 LAB — COMPLETE METABOLIC PANEL WITH GFR
AG Ratio: 1.9 (calc) (ref 1.0–2.5)
ALT: 16 U/L (ref 9–46)
AST: 18 U/L (ref 10–35)
Albumin: 4.5 g/dL (ref 3.6–5.1)
Alkaline phosphatase (APISO): 57 U/L (ref 35–144)
BUN: 24 mg/dL (ref 7–25)
CO2: 24 mmol/L (ref 20–32)
Calcium: 10.5 mg/dL — ABNORMAL HIGH (ref 8.6–10.3)
Chloride: 106 mmol/L (ref 98–110)
Creat: 1.21 mg/dL (ref 0.70–1.22)
Globulin: 2.4 g/dL (calc) (ref 1.9–3.7)
Glucose, Bld: 98 mg/dL (ref 65–99)
Potassium: 4.9 mmol/L (ref 3.5–5.3)
Sodium: 140 mmol/L (ref 135–146)
Total Bilirubin: 0.5 mg/dL (ref 0.2–1.2)
Total Protein: 6.9 g/dL (ref 6.1–8.1)
eGFR: 60 mL/min/{1.73_m2} (ref >=60)

## 2022-03-31 LAB — HEMOGLOBIN A1C
Hgb A1c MFr Bld: 5.6 % of total Hgb (ref <5.7)
Mean Plasma Glucose: 114 mg/dL
eAG (mmol/L): 6.3 mmol/L

## 2022-03-31 LAB — CBC WITH DIFFERENTIAL/PLATELET
Absolute Monocytes: 461 cells/uL (ref 200–950)
Basophils Absolute: 32 cells/uL (ref 0–200)
Basophils Relative: 0.6 %
Eosinophils Absolute: 212 cells/uL (ref 15–500)
Eosinophils Relative: 4 %
HCT: 44.2 % (ref 38.5–50.0)
Hemoglobin: 14.8 g/dL (ref 13.2–17.1)
Lymphs Abs: 1251 cells/uL (ref 850–3900)
MCH: 32.7 pg (ref 27.0–33.0)
MCHC: 33.5 g/dL (ref 32.0–36.0)
MCV: 97.6 fL (ref 80.0–100.0)
MPV: 10.2 fL (ref 7.5–12.5)
Monocytes Relative: 8.7 %
Neutro Abs: 3344 cells/uL (ref 1500–7800)
Neutrophils Relative %: 63.1 %
Platelets: 240 10*3/uL (ref 140–400)
RBC: 4.53 10*6/uL (ref 4.20–5.80)
RDW: 13.1 % (ref 11.0–15.0)
Total Lymphocyte: 23.6 %
WBC: 5.3 10*3/uL (ref 3.8–10.8)

## 2022-03-31 LAB — LIPID PANEL
Cholesterol: 135 mg/dL (ref <200)
HDL: 49 mg/dL (ref >=40)
LDL Cholesterol (Calc): 66 mg/dL (calc)
Non-HDL Cholesterol (Calc): 86 mg/dL (calc) (ref <130)
Total CHOL/HDL Ratio: 2.8 (calc) (ref <5.0)
Triglycerides: 115 mg/dL (ref <150)

## 2022-03-31 LAB — PSA: PSA: 0.68 ng/mL (ref <=4.00)

## 2022-04-01 ENCOUNTER — Ambulatory Visit (INDEPENDENT_AMBULATORY_CARE_PROVIDER_SITE_OTHER): Payer: Medicare Other | Admitting: Internal Medicine

## 2022-04-01 ENCOUNTER — Encounter: Payer: Self-pay | Admitting: Internal Medicine

## 2022-04-01 ENCOUNTER — Encounter: Payer: Self-pay | Admitting: *Deleted

## 2022-04-01 VITALS — BP 134/78 | HR 69 | Temp 97.8°F | Ht 67.0 in | Wt 202.0 lb

## 2022-04-01 DIAGNOSIS — I10 Essential (primary) hypertension: Secondary | ICD-10-CM

## 2022-04-01 DIAGNOSIS — Z0000 Encounter for general adult medical examination without abnormal findings: Secondary | ICD-10-CM

## 2022-04-01 DIAGNOSIS — R7302 Impaired glucose tolerance (oral): Secondary | ICD-10-CM

## 2022-04-01 DIAGNOSIS — K219 Gastro-esophageal reflux disease without esophagitis: Secondary | ICD-10-CM

## 2022-04-01 DIAGNOSIS — Z950 Presence of cardiac pacemaker: Secondary | ICD-10-CM

## 2022-04-01 DIAGNOSIS — G4730 Sleep apnea, unspecified: Secondary | ICD-10-CM | POA: Diagnosis not present

## 2022-04-01 DIAGNOSIS — F09 Unspecified mental disorder due to known physiological condition: Secondary | ICD-10-CM | POA: Diagnosis not present

## 2022-04-01 DIAGNOSIS — Z905 Acquired absence of kidney: Secondary | ICD-10-CM | POA: Diagnosis not present

## 2022-04-01 DIAGNOSIS — E7849 Other hyperlipidemia: Secondary | ICD-10-CM

## 2022-04-01 DIAGNOSIS — R413 Other amnesia: Secondary | ICD-10-CM

## 2022-04-01 DIAGNOSIS — Z23 Encounter for immunization: Secondary | ICD-10-CM | POA: Diagnosis not present

## 2022-04-01 DIAGNOSIS — R350 Frequency of micturition: Secondary | ICD-10-CM | POA: Diagnosis not present

## 2022-04-01 DIAGNOSIS — H3530 Unspecified macular degeneration: Secondary | ICD-10-CM | POA: Diagnosis not present

## 2022-04-01 LAB — POCT URINALYSIS DIPSTICK
Bilirubin, UA: NEGATIVE
Blood, UA: NEGATIVE
Glucose, UA: NEGATIVE
Ketones, UA: NEGATIVE
Leukocytes, UA: NEGATIVE
Nitrite, UA: NEGATIVE
Protein, UA: NEGATIVE
Spec Grav, UA: 1.015
Urobilinogen, UA: 0.2 U/dL
pH, UA: 5

## 2022-04-01 NOTE — Progress Notes (Signed)
Annual Wellness Visit     Patient: Robert Ramsey, Male    DOB: 05/14/1941, 81 y.o.   MRN: [id number] Visit Date: 04/01/2022  Chief Complaint  Patient presents with   Medicare Wellness   Subjective    Robert Ramsey is a 81 y.o. male who presents today for his Annual Wellness Visit. Also here for health maintenace exam and evaluation of medical issues.   Sees Dr. Milford Cage at [facility]. Has tried Myrbetriq and Flomax for urinary frequency. Has a lot of urinary frequency during the day.  Has put on weight past couple of years.  Resides alone and eats out daily generally for breakfast.  He is a widower.  Resides alone.  Family is supportive and checks in on him regularly.  He continues to drive.  He has mild memory disorder.  His wife passed away in September 25, 2018.  He has a history of hyperlipidemia treated with statin.  History of hypertension which is stable.  He is followed by [facility] and has history of complete heart block with pacemaker in place.  Had right shoulder decompression and debridement by Dr. Sharol Given in February 2022.  He went to physical therapy and recovered well.  He has sleep apnea and uses CPAP.  He has mild memory loss.  He has not gotten lost while driving.  He has a number of drug intolerances.  Most of these are intolerances rather than true allergies.  He had neuropsychological testing by Dr. Valentina Shaggy in 2014/09/25 and was diagnosed with mild cognitive impairment  History of appendectomy.  Had cataract surgery in December 2010.  History of left bundle branch block.  He quit smoking in September 24, 1993.  History of impaired glucose tolerance and GE reflux.  He had partial right nephrectomy for renal mass by Dr. Karsten Ro in 2010/09/25.  History of pacemaker in place for Mobitz type II AV block.  Had cardiac catheterization 09/24/2005 showing mild diffuse coronary artery disease.  Family history: Father deceased due to heart failure.  Mother deceased due to leukemia.  Is seeing Dr.  Posey Pronto for bilateral macular degeneration.  Patient reports this to be stable.        Review of Systems He is denies chest pain, shortness of breath.  Objective    Vitals: BP 134/78   Pulse 69   Temp 97.8 F (36.6 C) (Tympanic)   Ht '5\' 7"'$  (1.702 m)   Wt 202 lb (91.6 kg)   SpO2 98%   BMI 31.64 kg/m   Physical Exam  Skin: Warm and dry.  Nodes none.  TMs clear.  Pharynx clear.  Neck supple.  Chest is clear.  Cardiac exam: Regular rate and rhythm without ectopy.  Abdomen is soft nondistended without hepatosplenomegaly masses or tenderness.  No lower extremity edema.  Brief neurological exam is intact without gross focal deficits but he does have mild memory issues.  Prostate normal.   Most recent functional status assessment:    04/01/2022   10:57 AM  In your present state of health, do you have any difficulty performing the following activities:  Hearing? 0  Vision? 0  Difficulty concentrating or making decisions? 1  Dressing or bathing? 0  Doing errands, shopping? 0  Preparing Food and eating ? N  Using the Toilet? N  In the past six months, have you accidently leaked urine? N  Do you have problems with loss of bowel control? N  Managing your Medications? N  Managing your Finances? N  Housekeeping  or managing your Housekeeping? N   Most recent fall risk assessment:    04/01/2022   10:56 AM  Fall Risk   Falls in the past year? 0  Number falls in past yr: 0  Injury with Fall? 0  Risk for fall due to : No Fall Risks  Follow up Falls evaluation completed    Most recent depression screenings:    04/01/2022   10:56 AM 03/30/2021   11:06 AM  PHQ 2/9 Scores  PHQ - 2 Score 0 0   Most recent cognitive screening:    04/01/2022   10:58 AM  6CIT Screen  What Year? 0 points  What month? 0 points  What time? 0 points  Count back from 20 0 points  Months in reverse 4 points  Repeat phrase 10 points  Total Score 14 points       Assessment & Plan  Mild memory  loss-continues to drive and seems to be okay to do so  History of pacemaker for Mobitz  type II AV block followed by cardiology  GE reflux treated with Pepcid  Hypertension treated with low-dose losartan 25 mg daily  History of mild diffuse coronary disease  Sleep apnea treated with CPAP  Hyperlipidemia treated with low-dose Crestor and Zetia  History of partial nephrectomy in the past  Takes Myrbetriq for urinary urgency-to see urologist in the near future  Macular degeneration followed by ophthalmology  Plan: He may return in 1 year or as needed.  Flu vaccine given today.  He has had pneumococcal 13 and pneumococcal 23 vaccines.  Tetanus immunization in 2013 and update can be given if he becomes injured.  He also had my mild elevation of serum calcium and this was repeated today and is normal.       Annual wellness visit done today including the all of the following: Reviewed patient's Family Medical History Reviewed and updated list of patient's medical providers Assessment of cognitive impairment was done Assessed patient's functional ability Established a written schedule for health screening [place] Completed and Reviewed  Discussed health benefits of physical activity, and encouraged him to engage in regular exercise appropriate for his age and condition.         {I, Elby Showers, MD, have reviewed all documentation for this visit. The documentation on 04/02/22 for the exam, diagnosis, procedures, and orders are all accurate and complete.   LaVon Barron Alvine, CMA

## 2022-04-01 NOTE — Patient Instructions (Addendum)
Flu vaccine and Covid vaccine to be done at pharmacy. Will see Urologist in the near future.  It was a pleasure to see you today.  Return in 1 year or as needed.

## 2022-04-02 ENCOUNTER — Telehealth: Payer: Self-pay | Admitting: Internal Medicine

## 2022-04-02 LAB — CALCIUM: Calcium: 10 mg/dL (ref 8.6–10.3)

## 2022-04-02 NOTE — Telephone Encounter (Signed)
Churchill stop by office, he said he thought you were going to send in some new medication for him, but he does not know what it was.

## 2022-04-02 NOTE — Telephone Encounter (Signed)
LVM to CB.

## 2022-04-02 NOTE — Telephone Encounter (Signed)
Patient called back and he will call [facility], his yearly follow up is in January. However they said if he was having any problems for him to call and they will get him in sooner. He is going to call and make an appointment for Urine frequency

## 2022-04-05 ENCOUNTER — Ambulatory Visit (INDEPENDENT_AMBULATORY_CARE_PROVIDER_SITE_OTHER): Payer: Medicare Other | Admitting: Adult Health

## 2022-04-05 ENCOUNTER — Encounter: Payer: Self-pay | Admitting: Adult Health

## 2022-04-05 VITALS — BP 119/68 | HR 75 | Ht 66.0 in | Wt 201.2 lb

## 2022-04-05 DIAGNOSIS — G4733 Obstructive sleep apnea (adult) (pediatric): Secondary | ICD-10-CM | POA: Diagnosis not present

## 2022-04-05 DIAGNOSIS — R413 Other amnesia: Secondary | ICD-10-CM | POA: Diagnosis not present

## 2022-04-05 MED ORDER — DONEPEZIL HCL 5 MG PO TABS: 5.0000 mg | ORAL_TABLET | Freq: Every day | ORAL | 5 refills | Status: DC

## 2022-04-05 NOTE — Progress Notes (Signed)
PATIENT: Robert Ramsey DOB: [birth year]  REASON FOR VISIT: follow up HISTORY FROM: patient  Chief Complaint  Patient presents with   Follow-up    Pt in 19 here CPAP and memory f/u  Pt states no questions or concerns about CPAP machine . Pt states short term memory is a little worse  Pt not currently not taking Namenda      HISTORY OF PRESENT ILLNESS: Today 04/05/22:     Mr. Amini is an 81 year old male with a history of memory disturbance and obstructive sleep apnea on CPAP.  He returns today for follow-up.  In regards to his CPAP he reports it is working well.  Denies any new issues Memory: Feels that memory is stable. Living at home alone. Daughters check on him frequently. One of his daughter is having health issues which is causing him stress. Able to complete ADLs independently.able to manage finances, appointments, and medications. Couldn't tolerate namenda felt that it made memory worse. He was taking Aricept 5 mg at bedtime but for some reason he is not taking now. Now sure why.     Mr. Lograsso is an 81 year old male with a history of OSA on CPAP and memory disturbance. He returns today for followup. Here with his Daughter  Memory: Feels that memory is about the same. Lives home alone with his cat. Able to complete all ADLs independently. Manages his own medication- uses a checklist. Keeps up with his own appointments and finances. He does not feel like there has been any big changes with his mood or behavior. Daughter feels that he gets more frustrated when he can't remember things. Discussing with his family about assistant living or retirement home.    10/01/20: Mr.Labuda is an 81 year old male with a history of obstructive sleep apnea on CPAP and memory disturbance.  He returns today for follow-up.  His CPAP download shows excellent compliance.  Denies any issues with the CPAP.  Feels that his memory has remained relatively stable.  He lives at home alone.  His  daughters check on him frequently and help him with his meals.  He is able to complete all ADLs independently.  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  Returns today for an evaluation.    04/02/20:Mr. Vivier is an 81 year old male with a history of memory disturbance and obstructive sleep apnea.  He returns today for follow-up on his memory.  He feels that overall things are stable.  He continues to live at home alone.  He does have a cat there.  He is able to complete all ADLs independently.  He completes all household chores independently.  His daughters provide his meals.  He denies any trouble sleeping.  Continues to use CPAP consistently.  Overall he feels that he is doing well.  HISTORY 10/01/19:   Mr. Munger is an 81 year old male with a history of obstructive sleep apnea on CPAP.  His download indicates that he uses machine nightly for compliance of 100%.  He uses machine greater than 4 hours 29 days for compliance of 97%.  On average he uses his machine 8 hours and 15 minutes.  His residual AHI is 0.4 on 9 cm of water with EPR 3.  Leak in the 95th percentile is 13 L/min he reports that the CPAP is working well for him.     He feels that his memory has remained stable.  He currently lives at home alone.  He is able to complete all  ADLs independently.  He operates a Teacher, music without difficulty.  He is able to manage his own finances.  His daughter reports that he is repetitive other than that he has been doing well.   He returns today for an evaluation.  REVIEW OF SYSTEMS: Out of a complete 14 system review of symptoms, the patient complains only of the following symptoms, and all other reviewed systems are negative.  See HPI  ALLERGIES: Allergies  Allergen Reactions   Cinnamon Other (See Comments)    Cinnamon toothpaste caused him to break out inside his mouth and lesions were removed   Flexeril [Cyclobenzaprine Hcl] Other (See Comments)    Possible heart side effects    Levofloxacin Other (See Comments)    Elevated heart rate   Nabumetone Other (See Comments)    Heart issues/hospital admission   Naproxen Other (See Comments)    Effects blood pressure and heart   Nsaids Other (See Comments)    Effects blood pressure and heart   Vantin Other (See Comments)    Pseudo colitis   Vioxx [Rofecoxib] Other (See Comments)    Effects blood pressure and heart   Acyclovir And Related     Unknown reaction   Imodium [Loperamide Hcl]     unknown    HOME MEDICATIONS: Outpatient Medications Prior to Visit  Medication Sig Dispense Refill   aspirin 81 MG tablet Take 81 mg by mouth daily.     Carboxymethylcellul-Glycerin (LUBRICATING EYE DROPS OP) Place 1 drop into both eyes daily as needed (dry eyes).     donepezil (ARICEPT) 5 MG tablet Take 1 tablet (5 mg total) by mouth at bedtime. 30 tablet 5   ezetimibe (ZETIA) 10 MG tablet TAKE 1 TABLET BY MOUTH  DAILY 90 tablet 3   famotidine (PEPCID) 20 MG tablet Take 20 mg by mouth 2 (two) times daily.     losartan (COZAAR) 25 MG tablet TAKE 1 TABLET BY MOUTH  DAILY 90 tablet 3   metoprolol succinate (TOPROL XL) 25 MG 24 hr tablet Take 1 tablet (25 mg total) by mouth daily. 90 tablet 2   Multiple Vitamin (MULTIVITAMIN) tablet Take 1 tablet by mouth daily after supper.     Multiple Vitamins-Minerals (PRESERVISION AREDS 2 PO) Take 1 tablet by mouth 2 (two) times daily.     MYRBETRIQ 50 MG TB24 tablet Take 50 mg by mouth daily.     polyethylene glycol powder (GLYCOLAX/MIRALAX) 17 GM/SCOOP powder Take 17 g by mouth 2 (two) times daily as needed. 3350 g 1   rosuvastatin (CRESTOR) 5 MG tablet TAKE ONE-HALF TABLET BY  MOUTH 3 TIMES WEEKLY 20 tablet 3   sertraline (ZOLOFT) 25 MG tablet Take 1 tablet (25 mg total) by mouth daily. 90 tablet 3   tamsulosin (FLOMAX) 0.4 MG CAPS capsule Take 1 capsule (0.4 mg total) by mouth daily. 90 each 3   memantine (NAMENDA) 10 MG tablet Following 14 days on 5 mg bid this will be the next step. Take  10 mg twice a day po. (Patient not taking: Reported on 04/01/2022) 60 tablet 5   memantine (NAMENDA) 5 MG tablet Take 1 tablet (5 mg total) by mouth 2 (two) times daily. (Patient not taking: Reported on 04/01/2022) 28 tablet 0   No facility-administered medications prior to visit.    PAST MEDICAL HISTORY: Past Medical History:  Diagnosis Date   Abdominal pain    Abnormal stress test 09/15/10   SEPTAL DEFECT SECONDARY TO PREVIOUS INFARCT VS LBBB; MILD  SEPTAL ISCHEMIA   Acid reflux    Arthritis    CAD (coronary artery disease)    cath in 2007 reveals mild diffuse CAD   Colon polyps    Complete heart block [facility]) May 2011   has PTVP in place   Diverticulosis    Glucose intolerance (impaired glucose tolerance)    Hemorrhoids    Hiatal hernia    History of colonoscopy    HTN (hypertension)    Hypercholesterolemia    Memory impairment    better with lower dose statin   Pacemaker    Medtronic   PVC's (premature ventricular contractions)    Sinusitis    Sleep apnea    CPAP nightly   Ventral hernia     PAST SURGICAL HISTORY: Past Surgical History:  Procedure Laterality Date   APPENDECTOMY     CARDIAC CATHETERIZATION  07/29/2005   EF 50-55%; Has mild diffuse CAD   cataract surgery  06/2009, 10/2014   Dr. Kathrin Penner   COLONOSCOPY     CPAP     EYE SURGERY     INSERT / REPLACE / REMOVE PACEMAKER  11/18/2009   w/ Dr. Doreatha Lew   LBBB     NOSE SURGERY     PACEMAKER INSERTION  11/18/09   MDT implanted by Dr Doreatha Lew   REFRACTIVE SURGERY Bilateral    right nephrectomy  01/2006   partial for mass   SHOULDER ARTHROSCOPY Right 08/22/2020   Procedure: RIGHT SHOULDER ARTHROSCOPY, DEBRIDEMENT, AND DECOMPRESSION;  Surgeon: Newt Minion, MD;  Location: [facility];  Service: Orthopedics;  Laterality: Right;   TONSILLECTOMY      FAMILY HISTORY: Family History  Problem Relation Age of Onset   Leukemia Mother    Dementia Mother    Heart failure Father    Sleep apnea Neg Hx     SOCIAL  HISTORY: Social History   Socioeconomic History   Marital status: Married    Spouse name: [name]   Number of children: 4   Years of education: 12   Highest education level: Not on file  Occupational History   Occupation: Retired    Comment: [employer]  Tobacco Use   Smoking status: Former    Types: Cigarettes    Quit date: 07/06/1983    Years since quitting: 38.7   Smokeless tobacco: Never   Tobacco comments:    stopped smoking in 1985  Vaping Use   Vaping Use: Never used  Substance and Sexual Activity   Alcohol use: No   Drug use: No   Sexual activity: Not Currently  Other Topics Concern   Not on file  Social History Narrative   Retired from [employer], [employer].   Lives at home with wife   Caffeine use- morning coffee and 2 cup/ 1 cup of tea at lunch    Social Determinants of Health   Financial Resource Strain: Not on file  Food Insecurity: Not on file  Transportation Needs: Not on file  Physical Activity: Not on file  Stress: Not on file  Social Connections: Not on file  Intimate Partner Violence: Not on file      PHYSICAL EXAM  Vitals:   04/05/22 1035  BP: 119/68  Pulse: 75  Weight: 201 lb 3.2 oz (91.3 kg)  Height: '5\' 6"'$  (1.676 m)    Body mass index is 32.47 kg/m.      04/05/2022   10:37 AM 10/01/2021    8:40 AM 10/01/2020    9:05 AM  MMSE -  Mini Mental State Exam  Orientation to time '3 3 4  '$ Orientation to Place '4 4 5  '$ Registration '3 3 3  '$ Attention/ Calculation '2 2 4  '$ Recall 0 0 1  Language- name 2 objects '2 2 2  '$ Language- repeat 0 1 1  Language- follow 3 step command '3 3 3  '$ Language- read & follow direction '1 1 1  '$ Write a sentence '1 1 1  '$ Copy design 0 1 1  Total score '19 21 26     '$ Generalized: Well developed, in no acute distress   Neurological examination  Mentation: Alert oriented to time, place, history taking. Follows all commands speech and language fluent Cranial nerve II-XII: Pupils were equal round reactive to light. Extraocular  movements were full, visual field were full on confrontational test.  Head turning and shoulder shrug  were normal and symmetric. Motor: The motor testing reveals 5 over 5 strength of all 4 extremities. Good symmetric motor tone is noted throughout.  Sensory: Sensory testing is intact to soft touch on all 4 extremities. No evidence of extinction is noted.  Coordination: Cerebellar testing reveals good finger-nose-finger and heel-to-shin bilaterally.  Gait and station: Gait is normal.  Reflexes: Deep tendon reflexes are symmetric and normal bilaterally.   DIAGNOSTIC DATA (LABS, IMAGING, TESTING) - I reviewed patient records, labs, notes, testing and imaging myself where available.  Lab Results  Component Value Date   WBC 5.3 03/30/2022   HGB 14.8 03/30/2022   HCT 44.2 03/30/2022   MCV 97.6 03/30/2022   PLT 240 03/30/2022      Component Value Date/Time   NA 140 03/30/2022 1215   NA 142 10/01/2019 1113   K 4.9 03/30/2022 1215   CL 106 03/30/2022 1215   CO2 24 03/30/2022 1215   GLUCOSE 98 03/30/2022 1215   BUN 24 03/30/2022 1215   BUN 19 10/01/2019 1113   CREATININE 1.21 03/30/2022 1215   CALCIUM 10.0 04/01/2022 1143   PROT 6.9 03/30/2022 1215   PROT 6.2 10/01/2019 1113   ALBUMIN 4.2 10/01/2019 1113   AST 18 03/30/2022 1215   ALT 16 03/30/2022 1215   ALKPHOS 57 10/01/2019 1113   BILITOT 0.5 03/30/2022 1215   BILITOT 0.3 10/01/2019 1113   GFRNONAA >60 08/20/2020 1143   GFRNONAA 67 03/27/2020 0910   GFRAA 78 03/27/2020 0910   Lab Results  Component Value Date   CHOL 135 03/30/2022   HDL 49 03/30/2022   LDLCALC 66 03/30/2022   TRIG 115 03/30/2022   CHOLHDL 2.8 03/30/2022   Lab Results  Component Value Date   HGBA1C 5.6 03/30/2022   Lab Results  Component Value Date   [id number] 597 10/01/2019   Lab Results  Component Value Date   TSH 1.53 12/11/2018      ASSESSMENT AND PLAN 81 y.o. year old male  has a past medical history of Abdominal pain, Abnormal stress  test (09/15/10), Acid reflux, Arthritis, CAD (coronary artery disease), Colon polyps, Complete heart block [facility]) (May 2011), Diverticulosis, Glucose intolerance (impaired glucose tolerance), Hemorrhoids, Hiatal hernia, History of colonoscopy, HTN (hypertension), Hypercholesterolemia, Memory impairment, Pacemaker, PVC's (premature ventricular contractions), Sinusitis, Sleep apnea, and Ventral hernia. here with:  1.  Memory disturbance  Memory score MMSE 19/30  Restart Aricept 5 mg at bedtime  2.  Obstructive sleep apnea on CPAP  Encouraged patient to continue using nightly and greater than 4 hours each night Good compliance and treatment of apnea  Follow-up in 6 months or sooner if needed  Ward Givens, MSN, NP-C 04/05/2022, 10:47 AM [facility] [address] [place], [place] [address] [phone]

## 2022-04-05 NOTE — Patient Instructions (Signed)
Restart Aricept/Donepezil at 5 mg at bedtime Continue CPAP If your symptoms worsen or you develop new symptoms please let us know.

## 2022-04-08 DIAGNOSIS — H353221 Exudative age-related macular degeneration, left eye, with active choroidal neovascularization: Secondary | ICD-10-CM | POA: Diagnosis not present

## 2022-05-03 DIAGNOSIS — Z961 Presence of intraocular lens: Secondary | ICD-10-CM | POA: Diagnosis not present

## 2022-05-03 DIAGNOSIS — H353132 Nonexudative age-related macular degeneration, bilateral, intermediate dry stage: Secondary | ICD-10-CM | POA: Diagnosis not present

## 2022-05-03 DIAGNOSIS — H52203 Unspecified astigmatism, bilateral: Secondary | ICD-10-CM | POA: Diagnosis not present

## 2022-05-12 ENCOUNTER — Ambulatory Visit (INDEPENDENT_AMBULATORY_CARE_PROVIDER_SITE_OTHER): Payer: Medicare Other

## 2022-05-12 DIAGNOSIS — I442 Atrioventricular block, complete: Secondary | ICD-10-CM

## 2022-05-13 DIAGNOSIS — H353221 Exudative age-related macular degeneration, left eye, with active choroidal neovascularization: Secondary | ICD-10-CM | POA: Diagnosis not present

## 2022-05-15 LAB — CUP PACEART REMOTE DEVICE CHECK
Battery Impedance: 2111 Ohm
Battery Remaining Longevity: 33 mo
Battery Voltage: 2.75 V
Brady Statistic AP VP Percent: 17 %
Brady Statistic AP VS Percent: 0 %
Brady Statistic AS VP Percent: 83 %
Brady Statistic AS VS Percent: 0 %
Date Time Interrogation Session: 20231109192201
Implantable Lead Connection Status: 753985
Implantable Lead Connection Status: 753985
Implantable Lead Implant Date: 20110517
Implantable Lead Implant Date: 20110517
Implantable Lead Location: 753859
Implantable Lead Location: 753860
Implantable Lead Model: 4469
Implantable Lead Model: 4470
Implantable Lead Serial Number: 543103
Implantable Lead Serial Number: 672341
Implantable Pulse Generator Implant Date: 20110517
Lead Channel Impedance Value: 624 Ohm
Lead Channel Impedance Value: 695 Ohm
Lead Channel Pacing Threshold Amplitude: 0.375 V
Lead Channel Pacing Threshold Amplitude: 0.5 V
Lead Channel Pacing Threshold Pulse Width: 0.4 ms
Lead Channel Pacing Threshold Pulse Width: 0.4 ms
Lead Channel Setting Pacing Amplitude: 2 V
Lead Channel Setting Pacing Amplitude: 2.5 V
Lead Channel Setting Pacing Pulse Width: 0.4 ms
Lead Channel Setting Sensing Sensitivity: 2 mV
Zone Setting Status: 755011
Zone Setting Status: 755011

## 2022-05-25 NOTE — Progress Notes (Signed)
Remote pacemaker transmission.   

## 2022-06-10 ENCOUNTER — Other Ambulatory Visit: Payer: Self-pay | Admitting: Physician Assistant

## 2022-06-14 DIAGNOSIS — H353221 Exudative age-related macular degeneration, left eye, with active choroidal neovascularization: Secondary | ICD-10-CM | POA: Diagnosis not present

## 2022-08-11 ENCOUNTER — Other Ambulatory Visit: Payer: Self-pay | Admitting: Physician Assistant

## 2022-08-11 ENCOUNTER — Ambulatory Visit (INDEPENDENT_AMBULATORY_CARE_PROVIDER_SITE_OTHER): Payer: Medicare Other

## 2022-08-11 DIAGNOSIS — I442 Atrioventricular block, complete: Secondary | ICD-10-CM | POA: Diagnosis not present

## 2022-08-12 LAB — CUP PACEART REMOTE DEVICE CHECK
Battery Impedance: 2262 Ohm
Battery Remaining Longevity: 32 mo
Battery Voltage: 2.74 V
Brady Statistic AP VP Percent: 17 %
Brady Statistic AP VS Percent: 0 %
Brady Statistic AS VP Percent: 83 %
Brady Statistic AS VS Percent: 0 %
Date Time Interrogation Session: 20240208093455
Implantable Lead Connection Status: 753985
Implantable Lead Connection Status: 753985
Implantable Lead Implant Date: 20110517
Implantable Lead Implant Date: 20110517
Implantable Lead Location: 753859
Implantable Lead Location: 753860
Implantable Lead Model: 4469
Implantable Lead Model: 4470
Implantable Lead Serial Number: 543103
Implantable Lead Serial Number: 672341
Implantable Pulse Generator Implant Date: 20110517
Lead Channel Impedance Value: 662 Ohm
Lead Channel Impedance Value: 729 Ohm
Lead Channel Pacing Threshold Amplitude: 0.375 V
Lead Channel Pacing Threshold Amplitude: 0.5 V
Lead Channel Pacing Threshold Pulse Width: 0.4 ms
Lead Channel Pacing Threshold Pulse Width: 0.4 ms
Lead Channel Setting Pacing Amplitude: 2 V
Lead Channel Setting Pacing Amplitude: 2.5 V
Lead Channel Setting Pacing Pulse Width: 0.4 ms
Lead Channel Setting Sensing Sensitivity: 2 mV
Zone Setting Status: 755011
Zone Setting Status: 755011

## 2022-08-17 ENCOUNTER — Other Ambulatory Visit: Payer: Self-pay

## 2022-08-17 MED ORDER — ROSUVASTATIN CALCIUM 5 MG PO TABS: 2.5000 mg | ORAL_TABLET | ORAL | 1 refills | Status: DC

## 2022-08-23 ENCOUNTER — Other Ambulatory Visit: Payer: Self-pay | Admitting: Physician Assistant

## 2022-09-07 NOTE — Progress Notes (Signed)
Remote pacemaker transmission.   

## 2022-10-17 ENCOUNTER — Other Ambulatory Visit: Payer: Self-pay | Admitting: Physician Assistant

## 2022-10-20 ENCOUNTER — Encounter: Payer: Self-pay | Admitting: Adult Health

## 2022-10-20 ENCOUNTER — Ambulatory Visit (INDEPENDENT_AMBULATORY_CARE_PROVIDER_SITE_OTHER): Payer: Medicare Other | Admitting: Adult Health

## 2022-10-20 VITALS — BP 141/72 | HR 66 | Ht 70.0 in | Wt 204.2 lb

## 2022-10-20 DIAGNOSIS — G4733 Obstructive sleep apnea (adult) (pediatric): Secondary | ICD-10-CM

## 2022-10-20 DIAGNOSIS — R413 Other amnesia: Secondary | ICD-10-CM | POA: Diagnosis not present

## 2022-10-20 NOTE — Patient Instructions (Signed)
Continue using CPAP nightly and greater than 4 hours each night Continue Aricept  If your symptoms worsen or you develop new symptoms please let us know.

## 2022-10-20 NOTE — Progress Notes (Signed)
PATIENT: Robert Ramsey DOB: [birth year]  REASON FOR VISIT: follow up HISTORY FROM: patient  Chief Complaint  Patient presents with   Follow-up    Pt in 8 Pt here for CPAP and memory f/u Pt states CPAP  is great . Pt states when driving he may have a delay  reaction deciding how to get to his destination      HISTORY OF PRESENT ILLNESS: Today 10/20/22:  Robert Ramsey is a 82 y.o. male with a history of obstructive sleep apnea and memory disturbance.. Returns today for follow-up.   OSA on CPAP: Reports that CPAP is working well.    Memory: He continues to live at home alone.  He has his daughters that check on him frequently.  He is able to complete all ADLs independently.  He manages his own finances appointments and medications.  He remains on Aricept 5 mg at bedtime.  Cannot tolerate Namenda in the past       04/05/22:Robert Ramsey is an 82 year old male with a history of memory disturbance and obstructive sleep apnea on CPAP.  He returns today for follow-up.  In regards to his CPAP he reports it is working well.  Denies any new issues Memory: Feels that memory is stable. Living at home alone. Daughters check on him frequently. One of his daughter is having health issues which is causing him stress. Able to complete ADLs independently.able to manage finances, appointments, and medications. Couldn't tolerate namenda felt that it made memory worse. He was taking Aricept 5 mg at bedtime but for some reason he is not taking now. Now sure why.     Robert Ramsey is an 82 year old male with a history of OSA on CPAP and memory disturbance. He returns today for followup. Here with his Daughter  Memory: Feels that memory is about the same. Lives home alone with his cat. Able to complete all ADLs independently. Manages his own medication- uses a checklist. Keeps up with his own appointments and finances. He does not feel like there has been any big changes with his mood or behavior.  Daughter feels that he gets more frustrated when he can't remember things. Discussing with his family about assistant living or retirement home.    10/01/20: Robert Ramsey is an 82 year old male with a history of obstructive sleep apnea on CPAP and memory disturbance.  He returns today for follow-up.  His CPAP download shows excellent compliance.  Denies any issues with the CPAP.  Feels that his memory has remained relatively stable.  He lives at home alone.  His daughters check on him frequently and help him with his meals.  He is able to complete all ADLs independently.  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  Returns today for an evaluation.    04/02/20:Robert Ramsey is an 82 year old male with a history of memory disturbance and obstructive sleep apnea.  He returns today for follow-up on his memory.  He feels that overall things are stable.  He continues to live at home alone.  He does have a cat there.  He is able to complete all ADLs independently.  He completes all household chores independently.  His daughters provide his meals.  He denies any trouble sleeping.  Continues to use CPAP consistently.  Overall he feels that he is doing well.  HISTORY 10/01/19:   Robert Ramsey is an 82 year old male with a history of obstructive sleep apnea on CPAP.  His download indicates that he uses  machine nightly for compliance of 100%.  He uses machine greater than 4 hours 29 days for compliance of 97%.  On average he uses his machine 8 hours and 15 minutes.  His residual AHI is 0.4 on 9 cm of water with EPR 3.  Leak in the 95th percentile is 13 L/min he reports that the CPAP is working well for him.     He feels that his memory has remained stable.  He currently lives at home alone.  He is able to complete all ADLs independently.  He operates a Librarian, academic without difficulty.  He is able to manage his own finances.  His daughter reports that he is repetitive other than that he has been doing well.   He  returns today for an evaluation.  REVIEW OF SYSTEMS: Out of a complete 14 system review of symptoms, the patient complains only of the following symptoms, and all other reviewed systems are negative.  ESS 6 FSS 68  ALLERGIES: Allergies  Allergen Reactions   Cinnamon Other (See Comments)    Cinnamon toothpaste caused him to break out inside his mouth and lesions were removed   Flexeril [Cyclobenzaprine Hcl] Other (See Comments)    Possible heart side effects   Levofloxacin Other (See Comments)    Elevated heart rate   Nabumetone Other (See Comments)    Heart issues/hospital admission   Naproxen Other (See Comments)    Effects blood pressure and heart   Nsaids Other (See Comments)    Effects blood pressure and heart   Vantin Other (See Comments)    Pseudo colitis   Vioxx [Rofecoxib] Other (See Comments)    Effects blood pressure and heart   Acyclovir And Related     Unknown reaction   Imodium [Loperamide Hcl]     unknown    HOME MEDICATIONS: Outpatient Medications Prior to Visit  Medication Sig Dispense Refill   aspirin 81 MG tablet Take 81 mg by mouth daily.     Carboxymethylcellul-Glycerin (LUBRICATING EYE DROPS OP) Place 1 drop into both eyes daily as needed (dry eyes).     donepezil (ARICEPT) 5 MG tablet Take 1 tablet (5 mg total) by mouth at bedtime. 30 tablet 5   ezetimibe (ZETIA) 10 MG tablet TAKE 1 TABLET BY MOUTH  DAILY 90 tablet 3   famotidine (PEPCID) 20 MG tablet Take 20 mg by mouth 2 (two) times daily.     losartan (COZAAR) 25 MG tablet TAKE 1 TABLET BY MOUTH DAILY 90 tablet 1   memantine (NAMENDA) 10 MG tablet Following 14 days on 5 mg bid this will be the next step. Take 10 mg twice a day po. 60 tablet 5   memantine (NAMENDA) 5 MG tablet Take 1 tablet (5 mg total) by mouth 2 (two) times daily. 28 tablet 0   metoprolol succinate (TOPROL-XL) 25 MG 24 hr tablet TAKE 1 TABLET BY MOUTH DAILY 90 tablet 0   Multiple Vitamin (MULTIVITAMIN) tablet Take 1 tablet by  mouth daily after supper.     Multiple Vitamins-Minerals (PRESERVISION AREDS 2 PO) Take 1 tablet by mouth 2 (two) times daily.     MYRBETRIQ 50 MG TB24 tablet Take 50 mg by mouth daily.     polyethylene glycol powder (GLYCOLAX/MIRALAX) 17 GM/SCOOP powder Take 17 g by mouth 2 (two) times daily as needed. 3350 g 1   rosuvastatin (CRESTOR) 5 MG tablet Take 0.5 tablets (2.5 mg total) by mouth 3 (three) times a week. 15 tablet 1  sertraline (ZOLOFT) 25 MG tablet Take 1 tablet (25 mg total) by mouth daily. 90 tablet 3   tamsulosin (FLOMAX) 0.4 MG CAPS capsule Take 1 capsule (0.4 mg total) by mouth daily. 90 each 3   No facility-administered medications prior to visit.    PAST MEDICAL HISTORY: Past Medical History:  Diagnosis Date   Abdominal pain    Abnormal stress test 09/15/10   SEPTAL DEFECT SECONDARY TO PREVIOUS INFARCT VS LBBB; MILD SEPTAL ISCHEMIA   Acid reflux    Arthritis    CAD (coronary artery disease)    cath in 2007 reveals mild diffuse CAD   Colon polyps    Complete heart block May 2011   has PTVP in place   Diverticulosis    Glucose intolerance (impaired glucose tolerance)    Hemorrhoids    Hiatal hernia    History of colonoscopy    HTN (hypertension)    Hypercholesterolemia    Memory impairment    better with lower dose statin   Pacemaker    Medtronic   PVC's (premature ventricular contractions)    Sinusitis    Sleep apnea    CPAP nightly   Ventral hernia     PAST SURGICAL HISTORY: Past Surgical History:  Procedure Laterality Date   APPENDECTOMY     CARDIAC CATHETERIZATION  07/29/2005   EF 50-55%; Has mild diffuse CAD   cataract surgery  06/2009, 10/2014   Dr. Dagoberto Ligas   COLONOSCOPY     CPAP     EYE SURGERY     INSERT / REPLACE / REMOVE PACEMAKER  11/18/2009   w/ Dr. Deborah Chalk   LBBB     NOSE SURGERY     PACEMAKER INSERTION  11/18/09   MDT implanted by Dr Deborah Chalk   REFRACTIVE SURGERY Bilateral    right nephrectomy  01/2006   partial for mass    SHOULDER ARTHROSCOPY Right 08/22/2020   Procedure: RIGHT SHOULDER ARTHROSCOPY, DEBRIDEMENT, AND DECOMPRESSION;  Surgeon: Nadara Mustard, MD;  Location: [facility] OR;  Service: Orthopedics;  Laterality: Right;   TONSILLECTOMY      FAMILY HISTORY: Family History  Problem Relation Age of Onset   Leukemia Mother    Dementia Mother    Heart failure Father    Sleep apnea Neg Hx     SOCIAL HISTORY: Social History   Socioeconomic History   Marital status: Married    Spouse name: Scarlette Calico   Number of children: 4   Years of education: 12   Highest education level: Not on file  Occupational History   Occupation: Retired    Comment: [employer]  Tobacco Use   Smoking status: Former    Types: Cigarettes    Quit date: 07/06/1983    Years since quitting: 39.3   Smokeless tobacco: Never   Tobacco comments:    stopped smoking in 1985  Vaping Use   Vaping Use: Never used  Substance and Sexual Activity   Alcohol use: No   Drug use: No   Sexual activity: Not Currently  Other Topics Concern   Not on file  Social History Narrative   Retired from Environmental health practitioner.   Lives at home with wife   Caffeine use- morning coffee and 2 cup/ 1 cup of tea at lunch    Social Determinants of Health   Financial Resource Strain: Not on file  Food Insecurity: Not on file  Transportation Needs: Not on file  Physical Activity: Not on file  Stress: Not on file  Social Connections: Not on file  Intimate Partner Violence: Not on file      PHYSICAL EXAM  Vitals:   10/20/22 1037  BP: (!) 141/72  Pulse: 66  Weight: 204 lb 3.2 oz (92.6 kg)  Height: 5\' 10"  (1.778 m)     Body mass index is 29.3 kg/m.      10/20/2022   10:42 AM 04/05/2022   10:37 AM 10/01/2021    8:40 AM  MMSE - Mini Mental State Exam  Orientation to time 3 3 3   Orientation to Place 5 4 4   Registration 3 3 3   Attention/ Calculation 3 2 2   Recall 0 0 0  Language- name 2 objects 2 2 2   Language- repeat 0 0 1  Language- follow 3  step command 3 3 3   Language- read & follow direction 1 1 1   Write a sentence 0 1 1  Copy design 1 0 1  Total score 21 19 21      Generalized: Well developed, in no acute distress   Neurological examination  Mentation: Alert oriented to time, place, history taking. Follows all commands speech and language fluent Cranial nerve II-XII: Pupils were equal round reactive to light. Extraocular movements were full, visual field were full on confrontational test.  Head turning and shoulder shrug  were normal and symmetric. Motor: The motor testing reveals 5 over 5 strength of all 4 extremities. Good symmetric motor tone is noted throughout.  Sensory: Sensory testing is intact to soft touch on all 4 extremities. No evidence of extinction is noted.  Coordination: Cerebellar testing reveals good finger-nose-finger and heel-to-shin bilaterally.  Gait and station: Gait is normal.  Reflexes: Deep tendon reflexes are symmetric and normal bilaterally.   DIAGNOSTIC DATA (LABS, IMAGING, TESTING) - I reviewed patient records, labs, notes, testing and imaging myself where available.  Lab Results  Component Value Date   WBC 5.3 03/30/2022   HGB 14.8 03/30/2022   HCT 44.2 03/30/2022   MCV 97.6 03/30/2022   PLT 240 03/30/2022      Component Value Date/Time   NA 140 03/30/2022 1215   NA 142 10/01/2019 1113   K 4.9 03/30/2022 1215   CL 106 03/30/2022 1215   CO2 24 03/30/2022 1215   GLUCOSE 98 03/30/2022 1215   BUN 24 03/30/2022 1215   BUN 19 10/01/2019 1113   CREATININE 1.21 03/30/2022 1215   CALCIUM 10.0 04/01/2022 1143   PROT 6.9 03/30/2022 1215   PROT 6.2 10/01/2019 1113   ALBUMIN 4.2 10/01/2019 1113   AST 18 03/30/2022 1215   ALT 16 03/30/2022 1215   ALKPHOS 57 10/01/2019 1113   BILITOT 0.5 03/30/2022 1215   BILITOT 0.3 10/01/2019 1113   GFRNONAA >60 08/20/2020 1143   GFRNONAA 67 03/27/2020 0910   GFRAA 78 03/27/2020 0910   Lab Results  Component Value Date   CHOL 135 03/30/2022    HDL 49 03/30/2022   LDLCALC 66 03/30/2022   TRIG 115 03/30/2022   CHOLHDL 2.8 03/30/2022   Lab Results  Component Value Date   HGBA1C 5.6 03/30/2022   Lab Results  Component Value Date   VITAMINB12 853 10/01/2019   Lab Results  Component Value Date   TSH 1.53 12/11/2018      ASSESSMENT AND PLAN 82 y.o. year old male  has a past medical history of Abdominal pain, Abnormal stress test (09/15/10), Acid reflux, Arthritis, CAD (coronary artery disease), Colon polyps, Complete heart block (May 2011), Diverticulosis, Glucose intolerance (impaired glucose tolerance),  Hemorrhoids, Hiatal hernia, History of colonoscopy, HTN (hypertension), Hypercholesterolemia, Memory impairment, Pacemaker, PVC's (premature ventricular contractions), Sinusitis, Sleep apnea, and Ventral hernia. here with:  1.  Memory disturbance  Memory score MMSE 21/30 stable Continue Aricept 10 mg at bedtime  2.  Obstructive sleep apnea on CPAP  Encouraged patient to continue using nightly and greater than 4 hours each night Good compliance and treatment of apnea  Follow-up in 1 year or sooner if needed   Butch Penny, MSN, NP-C 10/20/2022, 10:39 AM [facility] [address] [place], [place] [address] [phone]

## 2022-11-09 ENCOUNTER — Ambulatory Visit (INDEPENDENT_AMBULATORY_CARE_PROVIDER_SITE_OTHER): Payer: Medicare Other

## 2022-11-09 DIAGNOSIS — I442 Atrioventricular block, complete: Secondary | ICD-10-CM | POA: Diagnosis not present

## 2022-11-11 LAB — CUP PACEART REMOTE DEVICE CHECK
Battery Impedance: 2422 Ohm
Battery Remaining Longevity: 30 mo
Battery Voltage: 2.73 V
Brady Statistic AP VP Percent: 17 %
Brady Statistic AP VS Percent: 0 %
Brady Statistic AS VP Percent: 83 %
Brady Statistic AS VS Percent: 0 %
Date Time Interrogation Session: 20240509084117
Implantable Lead Connection Status: 753985
Implantable Lead Connection Status: 753985
Implantable Lead Implant Date: 20110517
Implantable Lead Implant Date: 20110517
Implantable Lead Location: 753859
Implantable Lead Location: 753860
Implantable Lead Model: 4469
Implantable Lead Model: 4470
Implantable Lead Serial Number: 543103
Implantable Lead Serial Number: 672341
Implantable Pulse Generator Implant Date: 20110517
Lead Channel Impedance Value: 642 Ohm
Lead Channel Impedance Value: 710 Ohm
Lead Channel Pacing Threshold Amplitude: 0.375 V
Lead Channel Pacing Threshold Amplitude: 0.5 V
Lead Channel Pacing Threshold Pulse Width: 0.4 ms
Lead Channel Pacing Threshold Pulse Width: 0.4 ms
Lead Channel Setting Pacing Amplitude: 2 V
Lead Channel Setting Pacing Amplitude: 2.5 V
Lead Channel Setting Pacing Pulse Width: 0.4 ms
Lead Channel Setting Sensing Sensitivity: 2 mV
Zone Setting Status: 755011
Zone Setting Status: 755011

## 2022-12-01 NOTE — Progress Notes (Signed)
Remote pacemaker transmission.   

## 2022-12-24 ENCOUNTER — Other Ambulatory Visit: Payer: Self-pay | Admitting: Physician Assistant

## 2023-01-15 ENCOUNTER — Other Ambulatory Visit: Payer: Self-pay | Admitting: Physician Assistant

## 2023-01-17 ENCOUNTER — Encounter: Payer: Self-pay | Admitting: Adult Health

## 2023-01-17 ENCOUNTER — Telehealth: Payer: Self-pay | Admitting: Adult Health

## 2023-01-17 NOTE — Telephone Encounter (Signed)
LVM and sent letter in mail informing pt of appt change from 10/20/23 to 10/25/23

## 2023-01-30 ENCOUNTER — Other Ambulatory Visit: Payer: Self-pay | Admitting: Physician Assistant

## 2023-02-08 ENCOUNTER — Ambulatory Visit: Payer: Medicare Other

## 2023-02-08 DIAGNOSIS — I442 Atrioventricular block, complete: Secondary | ICD-10-CM

## 2023-02-17 ENCOUNTER — Ambulatory Visit (INDEPENDENT_AMBULATORY_CARE_PROVIDER_SITE_OTHER): Payer: Medicare Other | Admitting: Internal Medicine

## 2023-02-17 ENCOUNTER — Telehealth: Payer: Self-pay | Admitting: Internal Medicine

## 2023-02-17 ENCOUNTER — Encounter: Payer: Self-pay | Admitting: Internal Medicine

## 2023-02-17 VITALS — BP 140/80 | HR 83 | Temp 100.3°F | Ht 70.0 in | Wt 204.0 lb

## 2023-02-17 DIAGNOSIS — Z950 Presence of cardiac pacemaker: Secondary | ICD-10-CM | POA: Diagnosis not present

## 2023-02-17 DIAGNOSIS — I10 Essential (primary) hypertension: Secondary | ICD-10-CM | POA: Diagnosis not present

## 2023-02-17 DIAGNOSIS — J029 Acute pharyngitis, unspecified: Secondary | ICD-10-CM | POA: Diagnosis not present

## 2023-02-17 DIAGNOSIS — G4730 Sleep apnea, unspecified: Secondary | ICD-10-CM

## 2023-02-17 DIAGNOSIS — R413 Other amnesia: Secondary | ICD-10-CM | POA: Diagnosis not present

## 2023-02-17 DIAGNOSIS — E7849 Other hyperlipidemia: Secondary | ICD-10-CM

## 2023-02-17 DIAGNOSIS — R7302 Impaired glucose tolerance (oral): Secondary | ICD-10-CM

## 2023-02-17 LAB — POCT RAPID STREP A (OFFICE): Rapid Strep A Screen: NEGATIVE

## 2023-02-17 MED ORDER — HYDROCODONE BIT-HOMATROP MBR 5-1.5 MG/5ML PO SOLN: 5.0000 mL | Freq: Three times a day (TID) | ORAL | 0 refills | Status: DC | PRN

## 2023-02-17 MED ORDER — AZITHROMYCIN 250 MG PO TABS: ORAL_TABLET | ORAL | 0 refills | Status: AC

## 2023-02-17 NOTE — Telephone Encounter (Signed)
Patient stop by the office to say that he was up last night having trouble swallowing because it hurt so bad. After talking with Dr Lenord Fellers we scheduled him to come back at 2:00 to be seen.

## 2023-02-17 NOTE — Patient Instructions (Signed)
We believe you likely have a viral pharyngitis.  Rapid strep screen is negative.  We have prescribed Zithromax Z-PAK 2 tabs day 1 followed by 1 tab days 2 through 5.  May take Hycodan 1 teaspoon every 8 hours as needed for sore throat pain/cough.  Rest and stay well-hydrated.  Call if not better in 7 days or sooner if worse.  If symptoms not improving we will consider further evaluation with ENT or barium swallow depending on symptoms at that time.

## 2023-02-17 NOTE — Progress Notes (Signed)
Patient Care Team: Margaree Mackintosh, MD as PCP - General (Internal Medicine) Suanne Marker, MD as Consulting Physician (Neurology) Emeline Darling, RN as Registered Nurse  Visit Date: 02/17/23  Subjective:    Patient ID: Robert Ramsey , Male   DOB: 02/07/1941, 82 y.o.    MRN: [id number]   82 y.o. Male presents today for pain upon swallowing since 02/03/23. He notices this pain almost every time he swallows. This has not improved. Denies acid reflux. He has not had any kind of cough, cold recently. Food is not becoming stuck in his throat. Appetite normal. No unintentional weight loss. Denies fever, chills, cough. Has not traveled. He eats restaurant food for almost every meal. Sleep is normal. History of memory loss, sleep apnea, complete heart block with pacemaker in-place.He has HTN and hyperlipidemia. Followed by Cardiology.  Past Medical History:  Diagnosis Date   Abdominal pain    Abnormal stress test 09/15/10   SEPTAL DEFECT SECONDARY TO PREVIOUS INFARCT VS LBBB; MILD SEPTAL ISCHEMIA   Acid reflux    Arthritis    CAD (coronary artery disease)    cath in 2006/02/27 reveals mild diffuse CAD   Colon polyps    Complete heart block [facility]) May 2011   has PTVP in place   Diverticulosis    Glucose intolerance (impaired glucose tolerance)    Hemorrhoids    Hiatal hernia    History of colonoscopy    HTN (hypertension)    Hypercholesterolemia    Memory impairment    better with lower dose statin   Pacemaker    Medtronic   PVC's (premature ventricular contractions)    Sinusitis    Sleep apnea    CPAP nightly   Ventral hernia      Family History  Problem Relation Age of Onset   Leukemia Mother    Dementia Mother    Heart failure Father    Sleep apnea Neg Hx     Social Hx: He is a widower. Resides alone. Family checks in on him regularly. He drives.Wife passed away in Feb 28, 2019.  He has a mild memory disorder.    Review of Systems  Constitutional:  Negative for chills,  fever and malaise/fatigue.  HENT:  Positive for sore throat. Negative for congestion.   Eyes:  Negative for blurred vision.  Respiratory:  Negative for cough and shortness of breath.   Cardiovascular:  Negative for chest pain, palpitations and leg swelling.  Gastrointestinal:  Negative for vomiting.  Musculoskeletal:  Negative for back pain.  Skin:  Negative for rash.  Neurological:  Negative for loss of consciousness and headaches.        Objective:   Vitals: BP (!) 140/80   Pulse 83   Temp 100.3 F (37.9 C)   Ht 5\' 10"  (1.778 m)   Wt 204 lb (92.5 kg)   SpO2 96%   BMI 29.27 kg/m    Physical Exam Vitals and nursing note reviewed.  Constitutional:      General: He is not in acute distress.    Appearance: Normal appearance. He is not ill-appearing.  HENT:     Head: Normocephalic and atraumatic.     Right Ear: Hearing, tympanic membrane, ear canal and external ear normal.     Left Ear: Hearing, tympanic membrane, ear canal and external ear normal.     Ears:     Comments: Cerumen obscuring bilateral TM's.    Mouth/Throat:     Pharynx: Oropharynx is clear.  Comments: Pharynx very slightly injected. Pulmonary:     Effort: Pulmonary effort is normal. No respiratory distress.     Breath sounds: Normal breath sounds. No wheezing or rales.  Lymphadenopathy:     Cervical: No cervical adenopathy.  Skin:    General: Skin is warm and dry.  Neurological:     Mental Status: He is alert and oriented to person, place, and time. Mental status is at baseline.  Psychiatric:        Mood and Affect: Mood normal.        Behavior: Behavior normal.        Thought Content: Thought content normal.        Judgment: Judgment normal.       Results:   Studies obtained and personally reviewed by me:   Labs:       Component Value Date/Time   NA 140 03/30/2022 1215   NA 142 10/01/2019 1113   K 4.9 03/30/2022 1215   CL 106 03/30/2022 1215   CO2 24 03/30/2022 1215   GLUCOSE 98  03/30/2022 1215   BUN 24 03/30/2022 1215   BUN 19 10/01/2019 1113   CREATININE 1.21 03/30/2022 1215   CALCIUM 10.0 04/01/2022 1143   PROT 6.9 03/30/2022 1215   PROT 6.2 10/01/2019 1113   ALBUMIN 4.2 10/01/2019 1113   AST 18 03/30/2022 1215   ALT 16 03/30/2022 1215   ALKPHOS 57 10/01/2019 1113   BILITOT 0.5 03/30/2022 1215   BILITOT 0.3 10/01/2019 1113   GFRNONAA >60 08/20/2020 1143   GFRNONAA 67 03/27/2020 0910   GFRAA 78 03/27/2020 0910     Lab Results  Component Value Date   WBC 5.3 03/30/2022   HGB 14.8 03/30/2022   HCT 44.2 03/30/2022   MCV 97.6 03/30/2022   PLT 240 03/30/2022    Lab Results  Component Value Date   CHOL 135 03/30/2022   HDL 49 03/30/2022   LDLCALC 66 03/30/2022   TRIG 115 03/30/2022   CHOLHDL 2.8 03/30/2022    Lab Results  Component Value Date   HGBA1C 5.6 03/30/2022     Lab Results  Component Value Date   TSH 1.53 12/11/2018     Lab Results  Component Value Date   PSA 0.68 03/30/2022   PSA 0.62 03/26/2021   PSA 0.7 03/19/2019      Assessment & Plan:   Suspected Viral Pharyngitis: rapid strep test negative. Prescribed Z-Pak two tabs day 1 followed by one tab days 2-5, Hycodan 1 tsp every 8 hours to be used sparingly. Rest well and drink plenty of fluids. Call if no improvement in 7 days.Consider barium swallow or ENT evaluation if symptoms do not improve  Vaccine counseling: he will go to the pharmacy for tetanus, Covid-19 vaccines.  Rapid strep screen is negative today.  I,Alexander Ruley,acting as a Neurosurgeon for Margaree Mackintosh, MD.,have documented all relevant documentation on the behalf of Margaree Mackintosh, MD,as directed by  Margaree Mackintosh, MD while in the presence of Margaree Mackintosh, MD.   I, Margaree Mackintosh, MD, have reviewed all documentation for this visit. The documentation on 02/17/23 for the exam, diagnosis, procedures, and orders are all accurate and complete.

## 2023-02-19 ENCOUNTER — Other Ambulatory Visit: Payer: Self-pay | Admitting: Physician Assistant

## 2023-02-23 NOTE — Progress Notes (Signed)
Remote pacemaker transmission.   

## 2023-03-03 ENCOUNTER — Other Ambulatory Visit: Payer: Self-pay | Admitting: Internal Medicine

## 2023-03-03 ENCOUNTER — Other Ambulatory Visit: Payer: Self-pay

## 2023-03-03 MED ORDER — EZETIMIBE 10 MG PO TABS: 10.0000 mg | ORAL_TABLET | Freq: Every day | ORAL | 0 refills | Status: DC

## 2023-03-19 ENCOUNTER — Other Ambulatory Visit: Payer: Self-pay | Admitting: Physician Assistant

## 2023-03-20 ENCOUNTER — Other Ambulatory Visit: Payer: Self-pay | Admitting: Physician Assistant

## 2023-03-27 ENCOUNTER — Other Ambulatory Visit: Payer: Self-pay | Admitting: Physician Assistant

## 2023-03-28 ENCOUNTER — Other Ambulatory Visit: Payer: Self-pay | Admitting: Physician Assistant

## 2023-03-31 ENCOUNTER — Other Ambulatory Visit: Payer: Medicare Other

## 2023-03-31 DIAGNOSIS — R7302 Impaired glucose tolerance (oral): Secondary | ICD-10-CM

## 2023-03-31 DIAGNOSIS — E7849 Other hyperlipidemia: Secondary | ICD-10-CM

## 2023-03-31 DIAGNOSIS — I10 Essential (primary) hypertension: Secondary | ICD-10-CM

## 2023-03-31 DIAGNOSIS — Z0000 Encounter for general adult medical examination without abnormal findings: Secondary | ICD-10-CM

## 2023-03-31 DIAGNOSIS — Z125 Encounter for screening for malignant neoplasm of prostate: Secondary | ICD-10-CM

## 2023-03-31 LAB — CBC WITH DIFFERENTIAL/PLATELET
Absolute Monocytes: 475 cells/uL (ref 200–950)
Basophils Absolute: 32 cells/uL (ref 0–200)
Basophils Relative: 0.6 %
Eosinophils Absolute: 173 cells/uL (ref 15–500)
Eosinophils Relative: 3.2 %
HCT: 39.7 % (ref 38.5–50.0)
Hemoglobin: 12.6 g/dL — ABNORMAL LOW (ref 13.2–17.1)
Lymphs Abs: 1037 cells/uL (ref 850–3900)
MCH: 30.5 pg (ref 27.0–33.0)
MCHC: 31.7 g/dL — ABNORMAL LOW (ref 32.0–36.0)
MCV: 96.1 fL (ref 80.0–100.0)
MPV: 10.3 fL (ref 7.5–12.5)
Monocytes Relative: 8.8 %
Neutro Abs: 3683 cells/uL (ref 1500–7800)
Neutrophils Relative %: 68.2 %
Platelets: 266 10*3/uL (ref 140–400)
RBC: 4.13 10*6/uL — ABNORMAL LOW (ref 4.20–5.80)
RDW: 13.3 % (ref 11.0–15.0)
Total Lymphocyte: 19.2 %
WBC: 5.4 10*3/uL (ref 3.8–10.8)

## 2023-04-01 LAB — LIPID PANEL
Cholesterol: 132 mg/dL (ref <200)
HDL: 42 mg/dL (ref >=40)
LDL Cholesterol (Calc): 69 mg/dL
Non-HDL Cholesterol (Calc): 90 mg/dL (ref <130)
Total CHOL/HDL Ratio: 3.1 (calc) (ref <5.0)
Triglycerides: 133 mg/dL (ref <150)

## 2023-04-01 LAB — HEMOGLOBIN A1C
Hgb A1c MFr Bld: 5.9 %{Hb} — ABNORMAL HIGH (ref <5.7)
Mean Plasma Glucose: 123 mg/dL
eAG (mmol/L): 6.8 mmol/L

## 2023-04-01 LAB — COMPLETE METABOLIC PANEL WITH GFR
AG Ratio: 1.8 (calc) (ref 1.0–2.5)
ALT: 13 U/L (ref 9–46)
AST: 16 U/L (ref 10–35)
Albumin: 4 g/dL (ref 3.6–5.1)
Alkaline phosphatase (APISO): 57 U/L (ref 35–144)
BUN: 19 mg/dL (ref 7–25)
CO2: 25 mmol/L (ref 20–32)
Calcium: 9.9 mg/dL (ref 8.6–10.3)
Chloride: 108 mmol/L (ref 98–110)
Creat: 0.92 mg/dL (ref 0.70–1.22)
Globulin: 2.2 g/dL (ref 1.9–3.7)
Glucose, Bld: 96 mg/dL (ref 65–99)
Potassium: 5 mmol/L (ref 3.5–5.3)
Sodium: 141 mmol/L (ref 135–146)
Total Bilirubin: 0.4 mg/dL (ref 0.2–1.2)
Total Protein: 6.2 g/dL (ref 6.1–8.1)
eGFR: 83 mL/min/{1.73_m2} (ref >=60)

## 2023-04-01 LAB — PSA: PSA: 0.63 ng/mL (ref <=4.00)

## 2023-04-04 ENCOUNTER — Encounter: Payer: Self-pay | Admitting: Internal Medicine

## 2023-04-04 ENCOUNTER — Ambulatory Visit (INDEPENDENT_AMBULATORY_CARE_PROVIDER_SITE_OTHER): Payer: Medicare Other | Admitting: Internal Medicine

## 2023-04-04 VITALS — BP 120/80 | HR 67 | Ht 66.0 in | Wt 190.0 lb

## 2023-04-04 DIAGNOSIS — E538 Deficiency of other specified B group vitamins: Secondary | ICD-10-CM

## 2023-04-04 DIAGNOSIS — K219 Gastro-esophageal reflux disease without esophagitis: Secondary | ICD-10-CM

## 2023-04-04 DIAGNOSIS — F09 Unspecified mental disorder due to known physiological condition: Secondary | ICD-10-CM

## 2023-04-04 DIAGNOSIS — G4730 Sleep apnea, unspecified: Secondary | ICD-10-CM

## 2023-04-04 DIAGNOSIS — R413 Other amnesia: Secondary | ICD-10-CM

## 2023-04-04 DIAGNOSIS — R634 Abnormal weight loss: Secondary | ICD-10-CM | POA: Diagnosis not present

## 2023-04-04 DIAGNOSIS — I10 Essential (primary) hypertension: Secondary | ICD-10-CM

## 2023-04-04 DIAGNOSIS — Z0000 Encounter for general adult medical examination without abnormal findings: Secondary | ICD-10-CM

## 2023-04-04 DIAGNOSIS — Z950 Presence of cardiac pacemaker: Secondary | ICD-10-CM

## 2023-04-04 LAB — POCT URINALYSIS DIP (CLINITEK)
Bilirubin, UA: NEGATIVE
Blood, UA: NEGATIVE
Glucose, UA: NEGATIVE mg/dL
Ketones, POC UA: NEGATIVE mg/dL
Leukocytes, UA: NEGATIVE
Nitrite, UA: NEGATIVE
POC PROTEIN,UA: NEGATIVE
Spec Grav, UA: 1.015 (ref 1.010–1.025)
Urobilinogen, UA: 0.2 U/dL
pH, UA: 6 (ref 5.0–8.0)

## 2023-04-04 NOTE — Patient Instructions (Addendum)
Vaccines discussed. Needs Tdap, flu vaccine and Covid booster. These can be done at local pharmacy as he has Medicare. Has seen urologist. Will have follow up with Neurology in the Spring. Has lost 12 pounds from last year. Check prealbumin. Generally eats out at [employer] dinner one good meal daily Stool is guaiac negative. Left message with daughter, Marcelino Duster, about my concerns and asked that she call us back. Is being followed by Neurology for memory loss.

## 2023-04-05 LAB — IRON,TIBC AND FERRITIN PANEL
%SAT: 28 % (ref 20–48)
Ferritin: 109 ng/mL (ref 24–380)
Iron: 73 ug/dL (ref 50–180)
TIBC: 258 ug/dL (ref 250–425)

## 2023-04-05 LAB — VITAMIN D 25 HYDROXY (VIT D DEFICIENCY, FRACTURES): Vit D, 25-Hydroxy: 37 ng/mL (ref 30–100)

## 2023-04-05 LAB — PREALBUMIN: Prealbumin: 19 mg/dL — ABNORMAL LOW (ref 21–43)

## 2023-04-05 LAB — VITAMIN B12: Vitamin B-12: 454 pg/mL (ref 200–1100)

## 2023-04-05 LAB — FOLATE: Folate: 22.3 ng/mL

## 2023-04-10 ENCOUNTER — Other Ambulatory Visit: Payer: Self-pay | Admitting: Physician Assistant

## 2023-04-11 ENCOUNTER — Ambulatory Visit (INDEPENDENT_AMBULATORY_CARE_PROVIDER_SITE_OTHER): Payer: Medicare Other

## 2023-04-11 VITALS — BP 140/80 | HR 84 | Ht 66.0 in | Wt 189.0 lb

## 2023-04-11 DIAGNOSIS — R634 Abnormal weight loss: Secondary | ICD-10-CM

## 2023-04-11 DIAGNOSIS — R413 Other amnesia: Secondary | ICD-10-CM

## 2023-04-11 DIAGNOSIS — E538 Deficiency of other specified B group vitamins: Secondary | ICD-10-CM

## 2023-04-11 MED ORDER — CYANOCOBALAMIN 1000 MCG/ML IJ SOLN
1000.0000 ug | Freq: Once | INTRAMUSCULAR | Status: AC
Start: 2023-04-11 — End: 2023-04-11
  Administered 2023-04-11: 1000 ug via INTRAMUSCULAR

## 2023-04-11 NOTE — Progress Notes (Signed)
Patient received a B12 injection IM RUQ.

## 2023-04-12 ENCOUNTER — Ambulatory Visit (INDEPENDENT_AMBULATORY_CARE_PROVIDER_SITE_OTHER): Payer: Medicare Other

## 2023-04-12 VITALS — BP 138/80 | HR 78 | Ht 66.0 in | Wt 190.0 lb

## 2023-04-12 DIAGNOSIS — E538 Deficiency of other specified B group vitamins: Secondary | ICD-10-CM | POA: Diagnosis not present

## 2023-04-12 DIAGNOSIS — R413 Other amnesia: Secondary | ICD-10-CM

## 2023-04-12 MED ORDER — CYANOCOBALAMIN 1000 MCG/ML IJ SOLN
1000.0000 ug | Freq: Once | INTRAMUSCULAR | Status: AC
Start: 2023-04-12 — End: 2023-04-12
  Administered 2023-04-12: 1000 ug via INTRAMUSCULAR

## 2023-04-12 NOTE — Progress Notes (Signed)
Patient received a B12 injection IM LUQ. Patient tolerated well.

## 2023-04-13 ENCOUNTER — Ambulatory Visit (INDEPENDENT_AMBULATORY_CARE_PROVIDER_SITE_OTHER): Payer: Medicare Other

## 2023-04-13 VITALS — BP 130/70 | HR 86 | Ht 66.0 in | Wt 190.0 lb

## 2023-04-13 DIAGNOSIS — R413 Other amnesia: Secondary | ICD-10-CM | POA: Diagnosis not present

## 2023-04-13 DIAGNOSIS — E538 Deficiency of other specified B group vitamins: Secondary | ICD-10-CM | POA: Diagnosis not present

## 2023-04-13 DIAGNOSIS — R634 Abnormal weight loss: Secondary | ICD-10-CM | POA: Diagnosis not present

## 2023-04-13 MED ORDER — CYANOCOBALAMIN 1000 MCG/ML IJ SOLN
1000.0000 ug | Freq: Once | INTRAMUSCULAR | Status: AC
Start: 2023-04-13 — End: 2023-04-13
  Administered 2023-04-13: 1000 ug via INTRAMUSCULAR

## 2023-04-13 NOTE — Progress Notes (Signed)
Patient received a B12 injection IM, patient tolerated well.

## 2023-04-14 ENCOUNTER — Ambulatory Visit (INDEPENDENT_AMBULATORY_CARE_PROVIDER_SITE_OTHER): Payer: Medicare Other

## 2023-04-14 VITALS — BP 120/80 | HR 80 | Temp 98.0°F | Wt 187.0 lb

## 2023-04-14 DIAGNOSIS — E538 Deficiency of other specified B group vitamins: Secondary | ICD-10-CM

## 2023-04-14 DIAGNOSIS — R634 Abnormal weight loss: Secondary | ICD-10-CM | POA: Diagnosis not present

## 2023-04-14 DIAGNOSIS — R413 Other amnesia: Secondary | ICD-10-CM

## 2023-04-14 MED ORDER — CYANOCOBALAMIN 1000 MCG/ML IJ SOLN
1000.0000 ug | Freq: Once | INTRAMUSCULAR | Status: AC
Start: 2023-04-14 — End: 2023-04-14
  Administered 2023-04-14: 1000 ug via INTRAMUSCULAR

## 2023-04-14 NOTE — Progress Notes (Signed)
Patient received a B12 injection IM, RUQ.

## 2023-04-15 ENCOUNTER — Encounter: Payer: Self-pay | Admitting: Internal Medicine

## 2023-04-15 ENCOUNTER — Ambulatory Visit (INDEPENDENT_AMBULATORY_CARE_PROVIDER_SITE_OTHER): Payer: Medicare Other | Admitting: Internal Medicine

## 2023-04-15 VITALS — BP 160/80 | HR 70 | Ht 66.0 in | Wt 189.0 lb

## 2023-04-15 DIAGNOSIS — E538 Deficiency of other specified B group vitamins: Secondary | ICD-10-CM

## 2023-04-15 DIAGNOSIS — I10 Essential (primary) hypertension: Secondary | ICD-10-CM

## 2023-04-15 DIAGNOSIS — R634 Abnormal weight loss: Secondary | ICD-10-CM

## 2023-04-15 DIAGNOSIS — R413 Other amnesia: Secondary | ICD-10-CM | POA: Diagnosis not present

## 2023-04-15 MED ORDER — CYANOCOBALAMIN 1000 MCG/ML IJ SOLN
1000.0000 ug | Freq: Once | INTRAMUSCULAR | Status: AC
Start: 2023-04-15 — End: ?

## 2023-04-15 MED ORDER — DONEPEZIL HCL 5 MG PO TABS: 5.0000 mg | ORAL_TABLET | Freq: Every day | ORAL | 2 refills | Status: DC

## 2023-04-15 NOTE — Progress Notes (Addendum)
   Subjective:    Patient ID: Robert Ramsey, male    DOB: 1940/11/02, 82 y.o.   MRN: [id number]  HPI Here today for B12 injection. BP elevated on arrival at 160/100. Had pt rest for a few minutes and repeated BP rechecked and was 130/84. Weight is 189 pounds. He eats most of his meals outside the home at [employer]. Have discussed this with daughter recently. She says his appetite is good.Also concerned about weight loss.  Also, he has memory loss and I recently discussed memory loss medication with his daughter as we feel his memory has declined a bit over the past  few months. She is agreeable for him to try this.  Review of Systems no new complaints     Objective:   Physical Exam   BP 130/80 Chest: Clear Cor:RRR   Slightly confused today but alert   Weight 189 lbs today  was 204 pounds in August and 190 lbs on Sept 30th.      Assessment & Plan:   Memory loss- Start Aricept 5 mg daily- send in #90 tabs with directions one by mouth daily  Hx HTN lives alone- I do worry about compliance with meds but daughter feels he is compliant. BP rechecked after arrival and was WNL  B12 deficiency- will receive B12 injections here monthly   Plan: Receive B12 injection in 4 weeks and check BP then

## 2023-04-15 NOTE — Patient Instructions (Signed)
B12 injection given today. BP rechecked and was normal. Return in 4 weeks for monthly B12 injection and will check BP then.

## 2023-04-15 NOTE — Addendum Note (Signed)
Addended by: Margaree Mackintosh on: 04/15/2023 02:38 PM   Modules accepted: Orders, Level of Service

## 2023-04-22 ENCOUNTER — Ambulatory Visit (INDEPENDENT_AMBULATORY_CARE_PROVIDER_SITE_OTHER): Payer: Medicare Other

## 2023-04-22 VITALS — Ht 66.0 in | Wt 189.0 lb

## 2023-04-22 DIAGNOSIS — R413 Other amnesia: Secondary | ICD-10-CM | POA: Diagnosis not present

## 2023-04-22 DIAGNOSIS — R634 Abnormal weight loss: Secondary | ICD-10-CM | POA: Diagnosis not present

## 2023-04-22 DIAGNOSIS — E538 Deficiency of other specified B group vitamins: Secondary | ICD-10-CM | POA: Diagnosis not present

## 2023-04-22 MED ORDER — CYANOCOBALAMIN 1000 MCG/ML IJ SOLN
1000.0000 ug | Freq: Once | INTRAMUSCULAR | Status: AC
Start: 2023-04-22 — End: 2023-04-22
  Administered 2023-04-22: 1000 ug via INTRAMUSCULAR

## 2023-04-22 NOTE — Patient Instructions (Signed)
Patient is here for a B12 injection. Patient received b12 injection RUQ. Patient tolerated well. Return in a week.

## 2023-04-29 ENCOUNTER — Ambulatory Visit (INDEPENDENT_AMBULATORY_CARE_PROVIDER_SITE_OTHER): Payer: Medicare Other

## 2023-04-29 VITALS — BP 138/80 | HR 70 | Temp 97.9°F | Ht 66.0 in | Wt 190.0 lb

## 2023-04-29 DIAGNOSIS — R634 Abnormal weight loss: Secondary | ICD-10-CM

## 2023-04-29 DIAGNOSIS — E538 Deficiency of other specified B group vitamins: Secondary | ICD-10-CM

## 2023-04-29 DIAGNOSIS — R413 Other amnesia: Secondary | ICD-10-CM

## 2023-04-29 MED ORDER — CYANOCOBALAMIN 1000 MCG/ML IJ SOLN
1000.0000 ug | Freq: Once | INTRAMUSCULAR | Status: AC
  Administered 2023-04-29: 1000 ug via INTRAMUSCULAR

## 2023-04-29 NOTE — Progress Notes (Signed)
Patient is here for a b12 injection. Patient tolerated well.

## 2023-05-06 ENCOUNTER — Encounter: Payer: Self-pay | Admitting: Internal Medicine

## 2023-05-06 ENCOUNTER — Ambulatory Visit (INDEPENDENT_AMBULATORY_CARE_PROVIDER_SITE_OTHER): Payer: Medicare Other | Admitting: Internal Medicine

## 2023-05-06 VITALS — BP 130/80 | HR 84 | Wt 190.0 lb

## 2023-05-06 DIAGNOSIS — E538 Deficiency of other specified B group vitamins: Secondary | ICD-10-CM | POA: Diagnosis not present

## 2023-05-06 MED ORDER — CYANOCOBALAMIN 1000 MCG/ML IJ SOLN
1000.0000 ug | Freq: Once | INTRAMUSCULAR | Status: AC
  Administered 2023-05-06: 1000 ug via INTRAMUSCULAR

## 2023-05-06 NOTE — Addendum Note (Signed)
Addended by: Gregery Na on: 05/06/2023 11:22 AM   Modules accepted: Orders

## 2023-05-06 NOTE — Patient Instructions (Signed)
Vitain B12 1cc IM given today

## 2023-05-06 NOTE — Progress Notes (Signed)
   Subjective:    Patient ID: Robert Ramsey, male    DOB: 1941/04/26, 82 y.o.   MRN: [id number]  HPI B12 1 cc IM given by CMA today in office.    Review of Systems     Objective:   Physical Exam        Assessment & Plan:

## 2023-05-08 NOTE — Progress Notes (Unsigned)
Cardiology Office Note Date:  05/08/2023  Patient ID:  Robert, Ramsey [birth year], MRN [id number] PCP:  Margaree Mackintosh, MD  Electrophysiologist: Dr. Johney Frame    Chief Complaint:   *** annual visit  History of Present Illness: Robert Ramsey is a 82 y.o. male with history of LBBB, CHB w/PPM, HTN, HLD, OSA w/CPAP  He comes in today to be seen for Dr. Johney Frame, last seen by him Feb 2022, was doing well, no changes were made.  I saw him March 2023, He is doing very well. Lives independently and denies any difficulties with ADLs Has great family support, says his 4 daughters keep him well check in on. He no longer does his yard, but says he stays busy, not a couch potatoe! No CP, palpitations or cardiac awareness No  SOB No near syncope or syncope. His PMD does his labs Some degree of chronic edema, planned to update his echo given had been since 2007 and RV pacing 100%  LVEF remained 45%, Toprol added with plans to see him back in 3-97mo  I saw him 12/18/21 He is doing very well. No formal exercise but stays very busy and on the go. No exertional intolerances or difficulties with his afls He is very happy with how he is doing and feels. Sleeps well and gets around "good" No CP, palpitations or cardiac awareness No dizzy spells, near syncope or syncope. No SOB, DOE. Memory is not as good as it once was. Stable echo no med changes made  *** volume *** new ep MD WC reading *** symptoms *** MD next   Device information MDT dual chamber PPM implanted 11/18/2009   Past Medical History:  Diagnosis Date   Abdominal pain    Abnormal stress test 09/15/10   SEPTAL DEFECT SECONDARY TO PREVIOUS INFARCT VS LBBB; MILD SEPTAL ISCHEMIA   Acid reflux    Arthritis    CAD (coronary artery disease)    cath in 2007 reveals mild diffuse CAD   Colon polyps    Complete heart block [facility]) May 2011   has PTVP in place   Diverticulosis    Glucose intolerance (impaired glucose tolerance)     Hemorrhoids    Hiatal hernia    History of colonoscopy    HTN (hypertension)    Hypercholesterolemia    Memory impairment    better with lower dose statin   Pacemaker    Medtronic   PVC's (premature ventricular contractions)    Sinusitis    Sleep apnea    CPAP nightly   Ventral hernia     Past Surgical History:  Procedure Laterality Date   APPENDECTOMY     CARDIAC CATHETERIZATION  07/29/2005   EF 50-55%; Has mild diffuse CAD   cataract surgery  06/2009, 10/2014   Dr. Dagoberto Ligas   COLONOSCOPY     CPAP     EYE SURGERY     INSERT / REPLACE / REMOVE PACEMAKER  11/18/2009   w/ Dr. Deborah Chalk   LBBB     NOSE SURGERY     PACEMAKER INSERTION  11/18/09   MDT implanted by Dr Deborah Chalk   REFRACTIVE SURGERY Bilateral    right nephrectomy  01/2006   partial for mass   SHOULDER ARTHROSCOPY Right 08/22/2020   Procedure: RIGHT SHOULDER ARTHROSCOPY, DEBRIDEMENT, AND DECOMPRESSION;  Surgeon: Nadara Mustard, MD;  Location[facility];  Service: Orthopedics;  Laterality: Right;   TONSILLECTOMY      Current Outpatient Medications  Medication Sig  Dispense Refill   aspirin 81 MG tablet Take 81 mg by mouth daily.     Carboxymethylcellul-Glycerin (LUBRICATING EYE DROPS OP) Place 1 drop into both eyes daily as needed (dry eyes).     donepezil (ARICEPT) 5 MG tablet Take 1 tablet (5 mg total) by mouth at bedtime. 90 tablet 2   ezetimibe (ZETIA) 10 MG tablet TAKE 1 TABLET BY MOUTH DAILY 30 tablet 0   famotidine (PEPCID) 20 MG tablet Take 20 mg by mouth 2 (two) times daily.     HYDROcodone bit-homatropine (HYCODAN) 5-1.5 MG/5ML syrup Take 5 mLs by mouth every 8 (eight) hours as needed for cough. 120 mL 0   losartan (COZAAR) 25 MG tablet Take 1 tablet (25 mg total) by mouth daily. Please call [phone] to schedule an overdue appointment with Sherrilee Gilles, PA-C for future refills. Thank you. 30 tablet 11   memantine (NAMENDA) 10 MG tablet Following 14 days on 5 mg bid this will be the next step. Take 10 mg  twice a day po. 60 tablet 5   memantine (NAMENDA) 5 MG tablet Take 1 tablet (5 mg total) by mouth 2 (two) times daily. 28 tablet 0   metoprolol succinate (TOPROL-XL) 25 MG 24 hr tablet TAKE 1 TABLET BY MOUTH ONCE  DAILY 15 tablet 0   Multiple Vitamin (MULTIVITAMIN) tablet Take 1 tablet by mouth daily after supper.     Multiple Vitamins-Minerals (PRESERVISION AREDS 2 PO) Take 1 tablet by mouth 2 (two) times daily.     MYRBETRIQ 50 MG TB24 tablet Take 50 mg by mouth daily.     polyethylene glycol powder (GLYCOLAX/MIRALAX) 17 GM/SCOOP powder Take 17 g by mouth 2 (two) times daily as needed. 3350 g 1   rosuvastatin (CRESTOR) 5 MG tablet TAKE ONE-HALF TABLET BY MOUTH 3  TIMES WEEKLY 6 tablet 0   sertraline (ZOLOFT) 25 MG tablet TAKE 1 TABLET BY MOUTH DAILY 90 tablet 3   tamsulosin (FLOMAX) 0.4 MG CAPS capsule Take 1 capsule (0.4 mg total) by mouth daily. 90 each 3   Current Facility-Administered Medications  Medication Dose Route Frequency Provider Last Rate Last Admin   cyanocobalamin (VITAMIN B12) injection 1,000 mcg  1,000 mcg Intramuscular Once Margaree Mackintosh, MD        Allergies:   Cinnamon, Flexeril [cyclobenzaprine hcl], Levofloxacin, Nabumetone, Naproxen, Nsaids, Vantin, Vioxx [rofecoxib], Acyclovir and related, and Imodium [loperamide hcl]   Social History:  The patient  reports that he quit smoking about 39 years ago. His smoking use included cigarettes. He has never used smokeless tobacco. He reports that he does not drink alcohol and does not use drugs.   Family History:  The patient's family history includes Dementia in his mother; Heart failure in his father; Leukemia in his mother.  ROS:  Please see the history of present illness.    All other systems are reviewed and otherwise negative.   PHYSICAL EXAM:  VS:  There were no vitals taken for this visit. BMI: There is no height or weight on file to calculate BMI. Well nourished, well developed, in no acute distress HEENT:  normocephalic, atraumatic Neck: no JVD, carotid bruits or masses Cardiac:  *** RRR; no significant murmurs, no rubs, or gallops Lungs: *** CTA b/l, no wheezing, rhonchi or rales Abd: soft, nontender MS: no deformity or atrophy Ext: *** trace edema, wearing a compression stocking on the R, has for years for some chronic RLE swelling Skin: warm and dry, no rash Neuro:  No gross  deficits appreciated Psych: euthymic mood, full affect  *** PPM site (R side) is stable, no tethering or discomfort   EKG:  done today and reviewed by myself ***  Device interrogation done today and reviewed by myself:  *** Battery and lead measurements are good ***  09/11/21: TTE  1. Left ventricular ejection fraction by 3D volume is 45 %. The left  ventricle has mildly decreased function. The left ventricle demonstrates  global hypokinesis. Left ventricular diastolic function could not be  evaluated.   2. Right ventricular systolic function is normal. The right ventricular  size is normal. There is normal pulmonary artery systolic pressure.   3. The mitral valve is normal in structure. Mild mitral valve  regurgitation. No evidence of mitral stenosis.   4. The aortic valve is normal in structure. Aortic valve regurgitation is  mild. No aortic stenosis is present.   5. The inferior vena cava is normal in size with greater than 50%  respiratory variability, suggesting right atrial pressure of 3 mmHg.    11/25/2005: TTE SUMMARY   -  Overall left ventricular systolic function was mildly decreased.         Left ventricular ejection fraction was estimated , range         being 40 % to 45 %. Doppler parameters were consistent with         abnormal left ventricular relaxation; this left ventricular         filling pattern is also seen with aging. There was moderate         dyssynergic motion of the interventricular septum, consistent         with a conduction abnormality or paced rhythm.   -  Aortic valve  thickness was mildly increased.     Recent Labs: 03/31/2023: ALT 13; BUN 19; Creat 0.92; Hemoglobin 12.6; Platelets 266; Potassium 5.0; Sodium 141  03/31/2023: Cholesterol 132; HDL 42; LDL Cholesterol (Calc) 69; Total CHOL/HDL Ratio 3.1; Triglycerides 133   CrCl cannot be calculated (Patient's most recent lab result is older than the maximum 21 days allowed.).   Wt Readings from Last 3 Encounters:  05/06/23 190 lb (86.2 kg)  04/29/23 190 lb (86.2 kg)  04/22/23 189 lb (85.7 kg)     Other studies reviewed: Additional studies/records reviewed today include: summarized above  ASSESSMENT AND PLAN:  PPM *** Intact function *** No programming changes made  HTN ***  HLD Labs monitored and managed with his PMD  4. CM Stable LVEF 2023 at 45% ***   Disposition: ***   Current medicines are reviewed at length with the patient today.  The patient did not have any concerns regarding medicines.  Robert Fredrickson, PA-C 05/08/2023 9:38 AM     [facility] [address] [place] [address] [phone] (office)  [phone] (fax)

## 2023-05-10 ENCOUNTER — Ambulatory Visit (INDEPENDENT_AMBULATORY_CARE_PROVIDER_SITE_OTHER): Payer: Medicare Other

## 2023-05-10 DIAGNOSIS — I442 Atrioventricular block, complete: Secondary | ICD-10-CM | POA: Diagnosis not present

## 2023-05-10 LAB — CUP PACEART REMOTE DEVICE CHECK
Battery Impedance: 3617 Ohm
Battery Remaining Longevity: 18 mo
Battery Voltage: 2.69 V
Brady Statistic AP VP Percent: 16 %
Brady Statistic AP VS Percent: 0 %
Brady Statistic AS VP Percent: 84 %
Brady Statistic AS VS Percent: 0 %
Date Time Interrogation Session: 20241105065211
Implantable Lead Connection Status: 753985
Implantable Lead Connection Status: 753985
Implantable Lead Implant Date: 20110517
Implantable Lead Implant Date: 20110517
Implantable Lead Location: 753859
Implantable Lead Location: 753860
Implantable Lead Model: 4469
Implantable Lead Model: 4470
Implantable Lead Serial Number: 543103
Implantable Lead Serial Number: 672341
Implantable Pulse Generator Implant Date: 20110517
Lead Channel Impedance Value: 686 Ohm
Lead Channel Impedance Value: 724 Ohm
Lead Channel Pacing Threshold Amplitude: 0.375 V
Lead Channel Pacing Threshold Amplitude: 0.5 V
Lead Channel Pacing Threshold Pulse Width: 0.4 ms
Lead Channel Pacing Threshold Pulse Width: 0.4 ms
Lead Channel Setting Pacing Amplitude: 2 V
Lead Channel Setting Pacing Amplitude: 2.5 V
Lead Channel Setting Pacing Pulse Width: 0.4 ms
Lead Channel Setting Sensing Sensitivity: 2 mV
Zone Setting Status: 755011
Zone Setting Status: 755011

## 2023-05-11 ENCOUNTER — Encounter: Payer: Self-pay | Admitting: Physician Assistant

## 2023-05-11 ENCOUNTER — Ambulatory Visit: Payer: Medicare Other | Attending: Physician Assistant | Admitting: Physician Assistant

## 2023-05-11 VITALS — BP 142/76 | HR 77 | Ht 69.0 in | Wt 191.6 lb

## 2023-05-11 DIAGNOSIS — Z950 Presence of cardiac pacemaker: Secondary | ICD-10-CM

## 2023-05-11 DIAGNOSIS — I429 Cardiomyopathy, unspecified: Secondary | ICD-10-CM

## 2023-05-11 DIAGNOSIS — I10 Essential (primary) hypertension: Secondary | ICD-10-CM | POA: Diagnosis not present

## 2023-05-11 LAB — CUP PACEART INCLINIC DEVICE CHECK
Battery Impedance: 3588 Ohm
Battery Remaining Longevity: 18 mo
Battery Voltage: 2.69 V
Brady Statistic AP VP Percent: 16 %
Brady Statistic AP VS Percent: 0 %
Brady Statistic AS VP Percent: 84 %
Brady Statistic AS VS Percent: 0 %
Date Time Interrogation Session: 20241106104914
Implantable Lead Connection Status: 753985
Implantable Lead Connection Status: 753985
Implantable Lead Implant Date: 20110517
Implantable Lead Implant Date: 20110517
Implantable Lead Location: 753859
Implantable Lead Location: 753860
Implantable Lead Model: 4469
Implantable Lead Model: 4470
Implantable Lead Serial Number: 543103
Implantable Lead Serial Number: 672341
Implantable Pulse Generator Implant Date: 20110517
Lead Channel Impedance Value: 684 Ohm
Lead Channel Impedance Value: 701 Ohm
Lead Channel Pacing Threshold Amplitude: 0.25 V
Lead Channel Pacing Threshold Amplitude: 0.375 V
Lead Channel Pacing Threshold Amplitude: 0.5 V
Lead Channel Pacing Threshold Amplitude: 0.5 V
Lead Channel Pacing Threshold Pulse Width: 0.4 ms
Lead Channel Pacing Threshold Pulse Width: 0.4 ms
Lead Channel Pacing Threshold Pulse Width: 0.4 ms
Lead Channel Pacing Threshold Pulse Width: 0.4 ms
Lead Channel Sensing Intrinsic Amplitude: 2 mV
Lead Channel Setting Pacing Amplitude: 2 V
Lead Channel Setting Pacing Amplitude: 2.5 V
Lead Channel Setting Pacing Pulse Width: 0.4 ms
Lead Channel Setting Sensing Sensitivity: 2 mV
Zone Setting Status: 755011
Zone Setting Status: 755011

## 2023-05-11 NOTE — Patient Instructions (Signed)
Medication Instructions:  Your physician recommends that you continue on your current medications as directed. Please refer to the Current Medication list given to you today. *If you need a refill on your cardiac medications before your next appointment, please call your pharmacy*  Follow-Up: At [facility], you and your health needs are our priority.  As part of our continuing mission to provide you with exceptional heart care, we have created designated Provider Care Teams.  These Care Teams include your primary Cardiologist (physician) and Advanced Practice Providers (APPs -  Physician Assistants and Nurse Practitioners) who all work together to provide you with the care you need, when you need it.  We recommend signing up for the patient portal called "MyChart".  Sign up information is provided on this After Visit Summary.  MyChart is used to connect with patients for Virtual Visits (Telemedicine).  Patients are able to view lab/test results, encounter notes, upcoming appointments, etc.  Non-urgent messages can be sent to your provider as well.   To learn more about what you can do with MyChart, go to [email or web address].    Your next appointment:   1 year(s)  Provider:   Allegra Lai, MD

## 2023-05-13 ENCOUNTER — Ambulatory Visit (INDEPENDENT_AMBULATORY_CARE_PROVIDER_SITE_OTHER): Payer: Medicare Other

## 2023-05-13 VITALS — BP 120/60 | HR 70 | Ht 69.0 in | Wt 191.0 lb

## 2023-05-13 DIAGNOSIS — E538 Deficiency of other specified B group vitamins: Secondary | ICD-10-CM

## 2023-05-13 MED ORDER — CYANOCOBALAMIN 1000 MCG/ML IJ SOLN
1000.0000 ug | Freq: Once | INTRAMUSCULAR | Status: AC
  Administered 2023-05-13: 1000 ug via INTRAMUSCULAR

## 2023-05-13 NOTE — Progress Notes (Signed)
Patient presents for a b12 injection ( ). Patient tolerated well.

## 2023-05-29 ENCOUNTER — Other Ambulatory Visit: Payer: Self-pay | Admitting: Physician Assistant

## 2023-05-31 ENCOUNTER — Other Ambulatory Visit: Payer: Self-pay | Admitting: Physician Assistant

## 2023-06-07 NOTE — Progress Notes (Signed)
Remote pacemaker transmission.   

## 2023-06-10 ENCOUNTER — Ambulatory Visit (INDEPENDENT_AMBULATORY_CARE_PROVIDER_SITE_OTHER): Payer: Medicare Other

## 2023-06-10 VITALS — BP 120/70 | HR 60 | Ht 69.0 in | Wt 190.0 lb

## 2023-06-10 DIAGNOSIS — R413 Other amnesia: Secondary | ICD-10-CM

## 2023-06-10 DIAGNOSIS — E538 Deficiency of other specified B group vitamins: Secondary | ICD-10-CM

## 2023-06-10 DIAGNOSIS — R634 Abnormal weight loss: Secondary | ICD-10-CM

## 2023-06-10 MED ORDER — CYANOCOBALAMIN 1000 MCG/ML IJ SOLN
1000.0000 ug | Freq: Once | INTRAMUSCULAR | Status: AC
  Administered 2023-06-10: 1000 ug via INTRAMUSCULAR

## 2023-06-10 NOTE — Progress Notes (Signed)
Patient presents to the office for a b 12 injection. Patient tolerated well.

## 2023-06-23 ENCOUNTER — Other Ambulatory Visit: Payer: Medicare Other

## 2023-06-23 DIAGNOSIS — R413 Other amnesia: Secondary | ICD-10-CM

## 2023-06-23 DIAGNOSIS — E538 Deficiency of other specified B group vitamins: Secondary | ICD-10-CM

## 2023-06-23 DIAGNOSIS — R634 Abnormal weight loss: Secondary | ICD-10-CM

## 2023-06-24 LAB — VITAMIN B12: Vitamin B-12: 1038 pg/mL (ref 200–1100)

## 2023-07-19 ENCOUNTER — Ambulatory Visit (INDEPENDENT_AMBULATORY_CARE_PROVIDER_SITE_OTHER): Payer: Medicare Other | Admitting: Internal Medicine

## 2023-07-19 ENCOUNTER — Encounter: Payer: Self-pay | Admitting: Internal Medicine

## 2023-07-19 VITALS — BP 120/78 | HR 79 | Temp 98.5°F | Ht 69.0 in | Wt 191.0 lb

## 2023-07-19 DIAGNOSIS — H9313 Tinnitus, bilateral: Secondary | ICD-10-CM

## 2023-07-19 DIAGNOSIS — R413 Other amnesia: Secondary | ICD-10-CM | POA: Diagnosis not present

## 2023-07-19 DIAGNOSIS — E538 Deficiency of other specified B group vitamins: Secondary | ICD-10-CM

## 2023-07-19 MED ORDER — CYANOCOBALAMIN 1000 MCG/ML IJ SOLN
1000.0000 ug | Freq: Once | INTRAMUSCULAR | Status: AC
  Administered 2023-07-19: 1000 ug via INTRAMUSCULAR

## 2023-07-19 NOTE — Progress Notes (Addendum)
 Patient Care Team: Perri Ronal PARAS, MD as PCP - General (Internal Medicine) Margaret Eduard SAUNDERS, MD as Consulting Physician (Neurology) Janell Lyle RODES, RN as Registered Nurse  Visit Date: 07/19/23  Subjective:   Chief Complaint  Patient presents with   Tinnitus    High pitch sound in ears, started this week.    Patient PI:Robert Ramsey, Robert Ramsey DOB:03/19/1941,82 y.o. FMW:[id number]  He is here with his 1st wife.   83 y.o. Male presents today for acute visit with Tinnitus. Patient has a past medical history of Memory Impairment. Endorses constant high-pitched ringing. Denies affecting his sleep or listening to loud music.     History of B12 Deficiency treated with 1000 mg Vitamin-B12 injections monthly.   Past Medical History:  Diagnosis Date   Abdominal pain    Abnormal stress test 09/15/10   SEPTAL DEFECT SECONDARY TO PREVIOUS INFARCT VS LBBB; MILD SEPTAL ISCHEMIA   Acid reflux    Arthritis    CAD (coronary artery disease)    cath in 2007 reveals mild diffuse CAD   Colon polyps    Complete heart block [facility]) May 2011   has PTVP in place   Diverticulosis    Glucose intolerance (impaired glucose tolerance)    Hemorrhoids    Hiatal hernia    History of colonoscopy    HTN (hypertension)    Hypercholesterolemia    Memory impairment    better with lower dose statin   Pacemaker    Medtronic   PVC's (premature ventricular contractions)    Sinusitis    Sleep apnea    CPAP nightly   Ventral hernia     Family History  Problem Relation Age of Onset   Leukemia Mother    Dementia Mother    Heart failure Father    Sleep apnea Neg Hx    Social History   Social History Narrative   Retired from environmental health practitioner.   Lives at home with wife   Caffeine use- morning coffee and 2 cup/ 1 cup of tea at lunch    Review of Systems  Constitutional:  Negative for fever and malaise/fatigue.  HENT:  Positive for tinnitus. Negative for congestion.   Eyes:  Negative for blurred  vision.  Respiratory:  Negative for cough and shortness of breath.   Cardiovascular:  Negative for chest pain, palpitations and leg swelling.  Gastrointestinal:  Negative for vomiting.  Musculoskeletal:  Negative for back pain.  Skin:  Negative for rash.  Neurological:  Negative for loss of consciousness and headaches.     Objective:  Vitals: BP 120/78   Pulse 79   Temp 98.5 F (36.9 C)   Ht 5' 9 (1.753 m)   Wt 191 lb (86.6 kg)   SpO2 97%   BMI 28.21 kg/m   Physical Exam Vitals and nursing note reviewed.  Constitutional:      General: He is awake. He is not in acute distress.    Appearance: Normal appearance. He is not ill-appearing or toxic-appearing.  HENT:     Head: Normocephalic and atraumatic.     Right Ear: External ear normal. There is impacted cerumen.     Left Ear: External ear normal. There is impacted cerumen.  Eyes:     Extraocular Movements: Extraocular movements intact.     Pupils: Pupils are equal, round, and reactive to light.  Skin:    General: Skin is warm and dry.  Neurological:     General: No focal deficit present.  Mental Status: He is alert and oriented to person, place, and time. Mental status is at baseline.     Cranial Nerves: Cranial nerves 2-12 are intact.     Sensory: Sensation is intact.     Motor: Motor function is intact.     Coordination: Coordination is intact.     Gait: Gait is intact.     Deep Tendon Reflexes: Reflexes are normal and symmetric.  Psychiatric:        Attention and Perception: Attention normal.        Mood and Affect: Mood normal.        Speech: Speech normal.        Behavior: Behavior normal. Behavior is cooperative.        Thought Content: Thought content normal.        Cognition and Memory: Cognition and memory normal.        Judgment: Judgment normal.     Results:  Studies Obtained And Personally Reviewed By Me: Labs:     Component Value Date/Time   NA 141 03/31/2023 0935   NA 142 10/01/2019 1113   K  5.0 03/31/2023 0935   CL 108 03/31/2023 0935   CO2 25 03/31/2023 0935   GLUCOSE 96 03/31/2023 0935   BUN 19 03/31/2023 0935   BUN 19 10/01/2019 1113   CREATININE 0.92 03/31/2023 0935   CALCIUM  9.9 03/31/2023 0935   PROT 6.2 03/31/2023 0935   PROT 6.2 10/01/2019 1113   ALBUMIN 4.2 10/01/2019 1113   AST 16 03/31/2023 0935   ALT 13 03/31/2023 0935   ALKPHOS 57 10/01/2019 1113   BILITOT 0.4 03/31/2023 0935   BILITOT 0.3 10/01/2019 1113   GFRNONAA >60 08/20/2020 1143   GFRNONAA 67 03/27/2020 0910   GFRAA 78 03/27/2020 0910    Lab Results  Component Value Date   WBC 5.4 03/31/2023   HGB 12.6 (L) 03/31/2023   HCT 39.7 03/31/2023   MCV 96.1 03/31/2023   PLT 266 03/31/2023   Lab Results  Component Value Date   CHOL 132 03/31/2023   HDL 42 03/31/2023   LDLCALC 69 03/31/2023   TRIG 133 03/31/2023   CHOLHDL 3.1 03/31/2023   Lab Results  Component Value Date   HGBA1C 5.9 (H) 03/31/2023    Lab Results  Component Value Date   TSH 1.53 12/11/2018     Lab Results  Component Value Date   PSA 0.63 03/31/2023   PSA 0.68 03/30/2022   PSA 0.62 03/26/2021   Assessment & Plan:  Tinnitus: placing referral for ENT to evaluate. Some impacted cerumen bilaterally noted on physical exam, otherwise no concerns.  May try lipoflavinoids  available over the counter at pharmacy in the mean time to see if this will  help ear ringing known which is known  as tinnitus  B12 Deficiency: treated with 1000 mg Vitamin-B12 injections monthly. Received today.   Hx of memory loss followed by Neurology. Patient lives alone. Family checks in frequently. He is a widower. Family is supportive. He continues to drive.  I,Emily Lagle,acting as a neurosurgeon for Ronal JINNY Hailstone, MD.,have documented all relevant documentation on the behalf of Ronal JINNY Hailstone, MD,as directed by  Ronal JINNY Hailstone, MD while in the presence of Ronal JINNY Hailstone, MD.   I, Ronal JINNY Hailstone, MD, have reviewed all documentation for this visit. The  documentation on 07/20/23 for the exam, diagnosis, procedures, and orders are all accurate and complete.

## 2023-07-20 ENCOUNTER — Encounter: Payer: Self-pay | Admitting: Internal Medicine

## 2023-07-20 NOTE — Patient Instructions (Addendum)
 May try Lipoflavinoids available over the counter at pharmacy for tinnitus(ear ringing). We are referring you to [facility] here on [address] in [place] for cerumen removal and evaluation of tinnitus. Continue monthly B12 injections here for B12 deficiency. You received one today. We have made several upcoming monthly appts for you today.

## 2023-08-09 ENCOUNTER — Ambulatory Visit (INDEPENDENT_AMBULATORY_CARE_PROVIDER_SITE_OTHER): Payer: Medicare Other

## 2023-08-09 ENCOUNTER — Ambulatory Visit: Payer: Medicare Other

## 2023-08-09 DIAGNOSIS — I442 Atrioventricular block, complete: Secondary | ICD-10-CM

## 2023-08-10 ENCOUNTER — Telehealth: Payer: Self-pay

## 2023-08-10 LAB — CUP PACEART REMOTE DEVICE CHECK
Battery Impedance: 6025 Ohm
Battery Remaining Longevity: 3 mo
Battery Voltage: 2.61 V
Brady Statistic AP VP Percent: 18 %
Brady Statistic AP VS Percent: 0 %
Brady Statistic AS VP Percent: 82 %
Brady Statistic AS VS Percent: 0 %
Date Time Interrogation Session: 20250204064316
Implantable Lead Connection Status: 753985
Implantable Lead Connection Status: 753985
Implantable Lead Implant Date: 20110517
Implantable Lead Implant Date: 20110517
Implantable Lead Location: 753859
Implantable Lead Location: 753860
Implantable Lead Model: 4469
Implantable Lead Model: 4470
Implantable Lead Serial Number: 543103
Implantable Lead Serial Number: 672341
Implantable Pulse Generator Implant Date: 20110517
Lead Channel Impedance Value: 644 Ohm
Lead Channel Impedance Value: 697 Ohm
Lead Channel Pacing Threshold Amplitude: 0.375 V
Lead Channel Pacing Threshold Amplitude: 0.5 V
Lead Channel Pacing Threshold Pulse Width: 0.4 ms
Lead Channel Pacing Threshold Pulse Width: 0.4 ms
Lead Channel Setting Pacing Amplitude: 2 V
Lead Channel Setting Pacing Amplitude: 2.5 V
Lead Channel Setting Pacing Pulse Width: 0.4 ms
Lead Channel Setting Sensing Sensitivity: 2 mV
Zone Setting Status: 755011
Zone Setting Status: 755011

## 2023-08-10 NOTE — Telephone Encounter (Signed)
 Pt called back and I have let him know about his battery life and we will f/u with him

## 2023-08-10 NOTE — Telephone Encounter (Signed)
 Discussed battery and monthly monitoring with patient.  He was given his monthly send schedule. No further questions.

## 2023-08-10 NOTE — Telephone Encounter (Signed)
 Scheduled remote reviewed. Normal device function.   Battery estimated 42mo - route to triage for IFU Next remote 91 days.  LM for patient to call back and make aware. Mthly schedule set.

## 2023-08-15 ENCOUNTER — Encounter (INDEPENDENT_AMBULATORY_CARE_PROVIDER_SITE_OTHER): Payer: Self-pay | Admitting: Otolaryngology

## 2023-08-17 ENCOUNTER — Ambulatory Visit (INDEPENDENT_AMBULATORY_CARE_PROVIDER_SITE_OTHER): Payer: Medicare Other

## 2023-08-17 VITALS — BP 120/88 | HR 85 | Ht 69.0 in | Wt 191.0 lb

## 2023-08-17 DIAGNOSIS — R413 Other amnesia: Secondary | ICD-10-CM | POA: Diagnosis not present

## 2023-08-17 DIAGNOSIS — E538 Deficiency of other specified B group vitamins: Secondary | ICD-10-CM | POA: Diagnosis not present

## 2023-08-17 MED ORDER — CYANOCOBALAMIN 1000 MCG/ML IJ SOLN
1000.0000 ug | Freq: Once | INTRAMUSCULAR | Status: AC
  Administered 2023-08-17: 1000 ug via INTRAMUSCULAR

## 2023-08-17 NOTE — Progress Notes (Signed)
Patient presents to the clinic for a monthly B12 injection for history of B12 deficiency.   1 cc IM Vitamin B12 given by CMA, right upper quadrant.    RTC in one month for another injection.

## 2023-09-12 ENCOUNTER — Ambulatory Visit: Payer: Medicare Other

## 2023-09-12 DIAGNOSIS — I442 Atrioventricular block, complete: Secondary | ICD-10-CM

## 2023-09-12 LAB — CUP PACEART REMOTE DEVICE CHECK
Battery Impedance: 7275 Ohm
Battery Remaining Longevity: 1 mo — CL
Battery Voltage: 2.6 V
Brady Statistic AP VP Percent: 19 %
Brady Statistic AP VS Percent: 0 %
Brady Statistic AS VP Percent: 81 %
Brady Statistic AS VS Percent: 0 %
Date Time Interrogation Session: 20250310072207
Implantable Lead Connection Status: 753985
Implantable Lead Connection Status: 753985
Implantable Lead Implant Date: 20110517
Implantable Lead Implant Date: 20110517
Implantable Lead Location: 753859
Implantable Lead Location: 753860
Implantable Lead Model: 4469
Implantable Lead Model: 4470
Implantable Lead Serial Number: 543103
Implantable Lead Serial Number: 672341
Implantable Pulse Generator Implant Date: 20110517
Lead Channel Impedance Value: 693 Ohm
Lead Channel Impedance Value: 712 Ohm
Lead Channel Pacing Threshold Amplitude: 0.375 V
Lead Channel Pacing Threshold Amplitude: 0.5 V
Lead Channel Pacing Threshold Pulse Width: 0.4 ms
Lead Channel Pacing Threshold Pulse Width: 0.4 ms
Lead Channel Setting Pacing Amplitude: 2 V
Lead Channel Setting Pacing Amplitude: 2.5 V
Lead Channel Setting Pacing Pulse Width: 0.4 ms
Lead Channel Setting Sensing Sensitivity: 2 mV
Zone Setting Status: 755011
Zone Setting Status: 755011

## 2023-09-14 ENCOUNTER — Ambulatory Visit (INDEPENDENT_AMBULATORY_CARE_PROVIDER_SITE_OTHER): Payer: Medicare Other

## 2023-09-14 VITALS — BP 120/80 | HR 78 | Ht 69.0 in | Wt 190.0 lb

## 2023-09-14 DIAGNOSIS — R413 Other amnesia: Secondary | ICD-10-CM | POA: Diagnosis not present

## 2023-09-14 DIAGNOSIS — E538 Deficiency of other specified B group vitamins: Secondary | ICD-10-CM | POA: Diagnosis not present

## 2023-09-14 MED ORDER — CYANOCOBALAMIN 1000 MCG/ML IJ SOLN
1000.0000 ug | Freq: Once | INTRAMUSCULAR | Status: AC
  Administered 2023-09-14: 1000 ug via INTRAMUSCULAR

## 2023-09-14 NOTE — Progress Notes (Signed)
 Patient presents to the clinic for a monthly B12 injection for history of B12 deficiency.   1 cc IM Vitamin B12 given by CMA, right upper quadrant.    RTC in one month for another injection.

## 2023-09-15 NOTE — Progress Notes (Signed)
 Remote pacemaker transmission.

## 2023-10-12 ENCOUNTER — Ambulatory Visit: Payer: Medicare Other

## 2023-10-13 ENCOUNTER — Ambulatory Visit (INDEPENDENT_AMBULATORY_CARE_PROVIDER_SITE_OTHER)

## 2023-10-13 DIAGNOSIS — E538 Deficiency of other specified B group vitamins: Secondary | ICD-10-CM | POA: Diagnosis not present

## 2023-10-13 MED ORDER — CYANOCOBALAMIN 1000 MCG/ML IJ SOLN
1000.0000 ug | Freq: Once | INTRAMUSCULAR | Status: AC
Start: 2023-10-13 — End: 2023-10-13
  Administered 2023-10-13: 1000 ug via INTRAMUSCULAR

## 2023-10-13 NOTE — Progress Notes (Signed)
 Remote pacemaker transmission.

## 2023-10-18 ENCOUNTER — Encounter (INDEPENDENT_AMBULATORY_CARE_PROVIDER_SITE_OTHER): Payer: Self-pay

## 2023-10-18 ENCOUNTER — Ambulatory Visit (INDEPENDENT_AMBULATORY_CARE_PROVIDER_SITE_OTHER): Payer: Medicare Other | Admitting: Audiology

## 2023-10-18 ENCOUNTER — Ambulatory Visit

## 2023-10-18 ENCOUNTER — Ambulatory Visit (INDEPENDENT_AMBULATORY_CARE_PROVIDER_SITE_OTHER): Payer: Medicare Other | Admitting: Otolaryngology

## 2023-10-18 VITALS — BP 170/73 | HR 69 | Ht 66.0 in | Wt 180.0 lb

## 2023-10-18 DIAGNOSIS — H9313 Tinnitus, bilateral: Secondary | ICD-10-CM

## 2023-10-18 DIAGNOSIS — H903 Sensorineural hearing loss, bilateral: Secondary | ICD-10-CM

## 2023-10-18 DIAGNOSIS — H6123 Impacted cerumen, bilateral: Secondary | ICD-10-CM

## 2023-10-18 DIAGNOSIS — I442 Atrioventricular block, complete: Secondary | ICD-10-CM

## 2023-10-18 NOTE — Progress Notes (Signed)
 Dear Dr. Lenord Fellers, Here is my assessment for our mutual patient, Robert Ramsey. Thank you for allowing me the opportunity to care for your patient. Please do not hesitate to contact me should you have any other questions. Sincerely, Dr. Jovita Kussmaul  Otolaryngology Clinic Note Referring provider: Dr. Lenord Fellers HPI:  Robert Ramsey is a 83 y.o. male kindly referred by Dr. Lenord Fellers for evaluation of bilateral tinnitus  Initial visit (10/2023): Patient reports: noted 1-2 month history of bilateral non-pulsatile tinnitus, does not interrupt his sleep. Hears it more in quiet environments. Nothing really makes it better. No antecedent event - loud noise exposure, trauma to ear, medication changes. No hearing issues subjectively. He has not tried any masking. Patient denies: ear pain, fullness, vertigo, drainage Patient additionally denies: deep pain in ear canal, eustachian tube symptoms such as popping/crackling, sensitive to pressure changes Patient also denies barotrauma, vestibular suppressant use, ototoxic medication use Prior ear surgery: no Ears are not a problem otherwise. 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 Lenord Fellers (07/19/2023) IM: noted bilateral tinnitus, constant, does not affect his sleep; noted bilateral cerumen impaction; Dx: Tinnitus, Rx: Ref to ENT, trial lipoflav CBC and CMP 03/31/2023: WBC 5.4, Plt 12.6, Plt 266, Eos 173; CMP - BUN/Cr 19/0.92 CT Head 12/23/2014 independently interpreted with respect to ears - cuts thick so suboptimal eval of otic capsule and ossicular chain; mastoids and ME well aerated 10/2023 Audiogram was independently reviewed and  interpreted by me and it reveals - normal downsloping to bilateral moderate SNHL; WRT 96% AD and 92% AS at 60dB; A/A  SNHL= Sensorineural hearing loss  PMH/Meds/All/SocHx/FamHx/ROS:   Past Medical History:  Diagnosis Date   Abdominal pain    Abnormal stress test 09/15/10   SEPTAL DEFECT SECONDARY TO PREVIOUS INFARCT VS LBBB; MILD SEPTAL ISCHEMIA   Acid reflux    Arthritis    CAD (coronary artery disease)    cath in 2007 reveals mild diffuse CAD   Colon polyps    Complete heart block [facility]) May 2011   has PTVP in place   Diverticulosis    Glucose intolerance (impaired glucose tolerance)    Hemorrhoids    Hiatal hernia    History of colonoscopy    HTN (hypertension)    Hypercholesterolemia    Memory impairment    better with lower dose statin   Pacemaker    Medtronic   PVC's (premature ventricular contractions)    Sinusitis    Sleep apnea    CPAP nightly   Ventral hernia      Past Surgical History:  Procedure Laterality Date   APPENDECTOMY     CARDIAC CATHETERIZATION  07/29/2005   EF 50-55%; Has mild diffuse CAD   cataract surgery  06/2009, 10/2014   Dr. Dagoberto Ligas   COLONOSCOPY     CPAP     EYE SURGERY     INSERT / REPLACE / REMOVE PACEMAKER  11/18/2009   w/ Dr. Deborah Chalk   LBBB     NOSE SURGERY     PACEMAKER INSERTION  11/18/09   MDT implanted by Dr Deborah Chalk   REFRACTIVE SURGERY Bilateral    right nephrectomy  01/2006  partial for mass   SHOULDER ARTHROSCOPY Right 08/22/2020   Procedure: RIGHT SHOULDER ARTHROSCOPY, DEBRIDEMENT, AND DECOMPRESSION;  Surgeon: Timothy Ford, MD;  Location: [facility] OR;  Service: Orthopedics;  Laterality: Right;   TONSILLECTOMY      Family History  Problem Relation Age of Onset   Leukemia Mother    Dementia Mother    Heart failure Father    Sleep apnea Neg Hx      Social Connections: Moderately Integrated (04/04/2023)   Social Connection and Isolation Panel [NHANES]    Frequency of Communication with Friends and Family: More than  three times a week    Frequency of Social Gatherings with Friends and Family: Once a week    Attends Religious Services: More than 4 times per year    Active Member of [employer] or Organizations: Yes    Attends Banker Meetings: More than 4 times per year    Marital Status: Widowed      Current Outpatient Medications:    aspirin 81 MG tablet, Take 81 mg by mouth daily., Disp: , Rfl:    Carboxymethylcellul-Glycerin (LUBRICATING EYE DROPS OP), Place 1 drop into both eyes daily as needed (dry eyes)., Disp: , Rfl:    donepezil (ARICEPT) 5 MG tablet, Take 1 tablet (5 mg total) by mouth at bedtime., Disp: 90 tablet, Rfl: 2   ezetimibe (ZETIA) 10 MG tablet, TAKE 1 TABLET BY MOUTH DAILY, Disp: 90 tablet, Rfl: 3   famotidine (PEPCID) 20 MG tablet, Take 20 mg by mouth 2 (two) times daily., Disp: , Rfl:    losartan (COZAAR) 25 MG tablet, Take 1 tablet (25 mg total) by mouth daily. Please call [phone] to schedule an overdue appointment with Lavonna Prader, PA-C for future refills. Thank you., Disp: 30 tablet, Rfl: 11   metoprolol succinate (TOPROL-XL) 25 MG 24 hr tablet, TAKE 1 TABLET BY MOUTH ONCE  DAILY, Disp: 15 tablet, Rfl: 0   Multiple Vitamin (MULTIVITAMIN) tablet, Take 1 tablet by mouth daily after supper., Disp: , Rfl:    Multiple Vitamins-Minerals (PRESERVISION AREDS 2 PO), Take 1 tablet by mouth 2 (two) times daily., Disp: , Rfl:    rosuvastatin (CRESTOR) 5 MG tablet, TAKE ONE-HALF TABLET BY MOUTH 3  TIMES WEEKLY, Disp: 6 tablet, Rfl: 10   sertraline (ZOLOFT) 25 MG tablet, TAKE 1 TABLET BY MOUTH DAILY, Disp: 90 tablet, Rfl: 3  Current Facility-Administered Medications:    cyanocobalamin (VITAMIN B12) injection 1,000 mcg, 1,000 mcg, Intramuscular, Once, Baxley, Jaynie Meyers, MD   Physical Exam:   BP (!) 170/73 (BP Location: Right Arm, Patient Position: Sitting, Cuff Size: Normal)   Pulse 69   Ht 5\' 6"  (1.676 m)   Wt 180 lb (81.6 kg)   SpO2 94%   BMI 29.05 kg/m   Salient findings:   CN II-XII intact Given history and complaints, ear microscopy was indicated and performed for evaluation with findings as below in physical exam section and in procedures; bilateral cerumen impaction, after removal -- after clearance, bilateral EAC clear and TM intact with well pneumatized middle ear spaces Weber 512: mid Rinne 512: AC > BC b/l  Anterior rhinoscopy: Septum relatively midline; bilateral inferior turbinates without significant hypertrophy No lesions of oral cavity/oropharynx No obviously palpable neck masses/lymphadenopathy/thyromegaly No respiratory distress or stridor  Seprately Identifiable Procedures:  Prior to initiating any procedures, risks/benefits/alternatives were explained to the patient and verbal consent obtained. Procedure: Bilateral ear microscopy and cerumen removal using microscope (CPT 69210) - Mod 25 Pre-procedure diagnosis:  Cerumen impaction bilateral external ears Post-procedure diagnosis: same Indication: Bilateral cerumen impaction; given patient's otologic complaints and history as well as for improved and comprehensive examination of external ear and tympanic membrane, bilateral otologic examination using microscope was performed and impacted cerumen removed  Procedure: Patient was placed semi-recumbent. Both ear canals were examined using the microscope with findings above. Impacted cerumen removed on left and on right using suction with improvement in EAC examination and patency. Patient tolerated the procedure well.  Impression & Plans:  Kervens Roper is a 83 y.o. male with:  1. Sensorineural hearing loss (SNHL) of both ears   2. Bilateral impacted cerumen   3. Bilateral tinnitus    Audio without significant asymmetry, non-pulsatile b/l tinnitus; not bothering him significantly currently except in certain situations; we discussed options including masking, offered amplification, and tinnitus retraining; he'd like to observe for now We also  discussed repeat HT and he wishes to do follow up as needed for this Mineral oil drops 2-3 drops weekly to ear for ceruminosis F/u if any concerns  See below regarding exact medications prescribed this encounter including dosages and route: No orders of the defined types were placed in this encounter.   Thank you for allowing me the opportunity to care for your patient. Please do not hesitate to contact me should you have any other questions.  Sincerely, Milon Aloe, MD Otolaryngologist (ENT), [facility] Phone: [phone] Fax: [phone]  10/18/2023, 2:53 PM   MDM:  Level 3 Complexity/Problems addressed: low Data complexity: mod - independent review of notes, labs, ordering tests - Morbidity: low  - Prescription Drug prescribed or managed: no

## 2023-10-18 NOTE — Progress Notes (Addendum)
  [address], [address] [place], [place] [address] [phone]  Audiological Evaluation    Name: Robert Ramsey     DOB:   Sep 28, 1940      MRN:   [id number]                                                                                     Service Date: 10/18/2023     Accompanied by: Candice Chalet   Patient comes today after Dr. Lydia Sams, ENT sent a referral for a hearing evaluation due to concerns with tinnitus.   Symptoms Yes Details  Hearing loss  []    Tinnitus  [x]  Hissing sound in both ears - for a while  Ear pain/ infections/pressure  []    Balance problems  [x]  Off balance - sometimes  Noise exposure history  [x]  NAVY  Previous ear surgeries  []    Family history of hearing loss  []    Amplification  []    Other  []      Otoscopy: Right ear: Clear external ear canals and notable landmarks visualized on the tympanic membrane. Left ear:  Clear external ear canals and notable landmarks visualized on the tympanic membrane.  Tympanometry: Right ear: Type A- Normal external ear canal volume with normal middle ear pressure and tympanic membrane compliance Left ear: Type As- Normal external ear canal volume with normal middle ear pressure and low tympanic membrane compliance  Pure tone Audiometry: Right ear- Normal hearing from [phone] Hz, then mild to moderate sensorineural hearing loss from 4000Hz  - 8000 Hz. Left ear-  Normal hearing from [phone] Hz, then mild to moderate sensorineural hearing loss from 4000Hz  - 8000 Hz.  Speech Audiometry: Right ear- Speech Reception Threshold (SRT) was obtained at 20 dBHL. Left ear-Speech Reception Threshold (SRT) was obtained at 15 dBHL.   Word Recognition Score Tested using NU-6 (MLV) Right ear: 96% was obtained at a presentation level of 60 dBHL with contralateral masking which is deemed as  excellent. Left ear: 92% was obtained at a presentation level of 60 dBHL with contralateral masking which is deemed as  excellent.   The hearing  test results were completed under headphones and results are deemed to be of good reliability. Test technique:  conventional      Recommendations: Follow up with ENT as scheduled for today. Return for a hearing evaluation in 2 years, before if concerns with hearing changes arise or per MD recommendation. Consider a communication needs assessment after medical clearance for hearing aids is obtained, pending patient motivation.   Jacynda Brunke MARIE LEROUX-MARTINEZ, AUD

## 2023-10-19 ENCOUNTER — Telehealth: Payer: Self-pay

## 2023-10-19 LAB — CUP PACEART REMOTE DEVICE CHECK
Battery Impedance: 9033 Ohm
Battery Voltage: 2.58 V
Brady Statistic RV Percent Paced: 100 %
Date Time Interrogation Session: 20250415070150
Implantable Lead Connection Status: 753985
Implantable Lead Connection Status: 753985
Implantable Lead Implant Date: 20110517
Implantable Lead Implant Date: 20110517
Implantable Lead Location: 753859
Implantable Lead Location: 753860
Implantable Lead Model: 4469
Implantable Lead Model: 4470
Implantable Lead Serial Number: 543103
Implantable Lead Serial Number: 672341
Implantable Pulse Generator Implant Date: 20110517
Lead Channel Impedance Value: 653 Ohm
Lead Channel Impedance Value: 67 Ohm
Lead Channel Setting Pacing Amplitude: 2.5 V
Lead Channel Setting Pacing Pulse Width: 0.4 ms
Lead Channel Setting Sensing Sensitivity: 2 mV
Zone Setting Status: 755011
Zone Setting Status: 755011

## 2023-10-19 NOTE — Telephone Encounter (Signed)
 Pacemaker reached RRT 09/21/23.   Attempted to contact patient. No answer, left message to call back.

## 2023-10-20 ENCOUNTER — Ambulatory Visit: Payer: Medicare Other | Admitting: Adult Health

## 2023-10-21 NOTE — Telephone Encounter (Signed)
 Advised patient his ppm battery has reached ERI and to expect call from scheduler for apt with Dr. Marisela Sicks to discuss gen change. Pt voiced understanding and appreciative of call.

## 2023-10-24 ENCOUNTER — Telehealth: Payer: Self-pay

## 2023-10-24 NOTE — Telephone Encounter (Signed)
 LVM for pt to bring CPAP Machine to appointment on tomorrow 10/25/23.

## 2023-10-25 ENCOUNTER — Telehealth: Payer: Self-pay

## 2023-10-25 ENCOUNTER — Encounter: Payer: Medicare Other | Admitting: Adult Health

## 2023-10-25 NOTE — Telephone Encounter (Signed)
 Called pt's daughter (on [place]) to bring CPAP Machine to pt's appointment for tomorrow 10/26/23

## 2023-10-26 ENCOUNTER — Encounter: Payer: Self-pay | Admitting: Adult Health

## 2023-10-26 ENCOUNTER — Ambulatory Visit (INDEPENDENT_AMBULATORY_CARE_PROVIDER_SITE_OTHER): Admitting: Adult Health

## 2023-10-26 VITALS — BP 132/77 | HR 64 | Ht 69.0 in | Wt 194.0 lb

## 2023-10-26 DIAGNOSIS — R413 Other amnesia: Secondary | ICD-10-CM | POA: Diagnosis not present

## 2023-10-26 DIAGNOSIS — G4733 Obstructive sleep apnea (adult) (pediatric): Secondary | ICD-10-CM | POA: Diagnosis not present

## 2023-10-26 NOTE — Progress Notes (Signed)
 PATIENT: Robert Ramsey DOB: [birth year]  REASON FOR VISIT: follow up HISTORY FROM: patient  Chief Complaint  Patient presents with   Follow-up    Rm 20, daughter, Burdette Carolin.  Not using cpap since 10/04/2023 brought machine.  MMSE 20/30.      HISTORY OF PRESENT ILLNESS: Today 10/26/23:  RONNIE DOO is a 83 y.o. male with a history of OSA on CPAP and Memory disturbance. Returns today for follow-up. He is now at [facility].  Reports that he does enjoy there.  He is able to complete all ADLs independently.  He manages his own finances.  Remains on donepezil  5 mg at bedtime.  Reports that he is sleeping well.  Since he moved [facility] he has not been using his CPAP.  Reports that he is sleeping well.  No vivid dreams.  Not acting out dreams.  No hallucinations.  Overall he feels relatively stable.  His daughter does report that he has had several episodes driving that he got lost or turned around.  She does have concerns about him driving independently.  10/20/22: HERACLIO SEIDMAN is a 83 y.o. male with a history of obstructive sleep apnea and memory disturbance.. Returns today for follow-up.   OSA on CPAP: Reports that CPAP is working well.    Memory: He continues to live at home alone.  He has his daughters that check on him frequently.  He is able to complete all ADLs independently.  He manages his own finances appointments and medications.  He remains on Aricept  5 mg at bedtime.  Cannot tolerate Namenda  in the past       04/05/22:Mr. Sapia is an 83 year old male with a history of memory disturbance and obstructive sleep apnea on CPAP.  He returns today for follow-up.  In regards to his CPAP he reports it is working well.  Denies any new issues Memory: Feels that memory is stable. Living at home alone. Daughters check on him frequently. One of his daughter is having health issues which is causing him stress. Able to complete ADLs independently.able to manage finances,  appointments, and medications. Couldn't tolerate namenda  felt that it made memory worse. He was taking Aricept  5 mg at bedtime but for some reason he is not taking now. Now sure why.     Mr. Gill is an 83 year old male with a history of OSA on CPAP and memory disturbance. He returns today for followup. Here with his Daughter  Memory: Feels that memory is about the same. Lives home alone with his cat. Able to complete all ADLs independently. Manages his own medication- uses a checklist. Keeps up with his own appointments and finances. He does not feel like there has been any big changes with his mood or behavior. Daughter feels that he gets more frustrated when he can't remember things. Discussing with his family about assistant living or retirement home.    10/01/20: Mr.Davies is an 83 year old male with a history of obstructive sleep apnea on CPAP and memory disturbance.  He returns today for follow-up.  His CPAP download shows excellent compliance.  Denies any issues with the CPAP.  Feels that his memory has remained relatively stable.  He lives at home alone.  His daughters check on him frequently and help him with his meals.  He is able to complete all ADLs independently.  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  Returns today for an evaluation.    04/02/20:Mr. Katt is an 83 year old  male with a history of memory disturbance and obstructive sleep apnea.  He returns today for follow-up on his memory.  He feels that overall things are stable.  He continues to live at home alone.  He does have a cat there.  He is able to complete all ADLs independently.  He completes all household chores independently.  His daughters provide his meals.  He denies any trouble sleeping.  Continues to use CPAP consistently.  Overall he feels that he is doing well.  HISTORY 10/01/19:   Mr. Wuertz is an 83 year old male with a history of obstructive sleep apnea on CPAP.  His download indicates that he uses  machine nightly for compliance of 100%.  He uses machine greater than 4 hours 29 days for compliance of 97%.  On average he uses his machine 8 hours and 15 minutes.  His residual AHI is 0.4 on 9 cm of water with EPR 3.  Leak in the 95th percentile is 13 L/min he reports that the CPAP is working well for him.     He feels that his memory has remained stable.  He currently lives at home alone.  He is able to complete all ADLs independently.  He operates a Librarian, academic without difficulty.  He is able to manage his own finances.  His daughter reports that he is repetitive other than that he has been doing well.   He returns today for an evaluation.  REVIEW OF SYSTEMS: Out of a complete 14 system review of symptoms, the patient complains only of the following symptoms, and all other reviewed systems are negative.  ESS 6 FSS 68  ALLERGIES: Allergies  Allergen Reactions   Cinnamon Other (See Comments)    Cinnamon toothpaste caused him to break out inside his mouth and lesions were removed   Flexeril  [Cyclobenzaprine  Hcl] Other (See Comments)    Possible heart side effects   Levofloxacin Other (See Comments)    Elevated heart rate   Nabumetone Other (See Comments)    Heart issues/hospital admission   Naproxen Other (See Comments)    Effects blood pressure and heart   Nsaids Other (See Comments)    Effects blood pressure and heart   Vantin Other (See Comments)    Pseudo colitis   Vioxx [Rofecoxib] Other (See Comments)    Effects blood pressure and heart   Acyclovir And Related     Unknown reaction   Imodium [Loperamide Hcl]     unknown    HOME MEDICATIONS: Outpatient Medications Prior to Visit  Medication Sig Dispense Refill   aspirin 81 MG tablet Take 81 mg by mouth daily.     donepezil  (ARICEPT ) 5 MG tablet Take 1 tablet (5 mg total) by mouth at bedtime. 90 tablet 2   ezetimibe  (ZETIA ) 10 MG tablet TAKE 1 TABLET BY MOUTH DAILY 90 tablet 3   famotidine (PEPCID) 20 MG tablet Take 20  mg by mouth 2 (two) times daily.     losartan  (COZAAR ) 25 MG tablet Take 1 tablet (25 mg total) by mouth daily. Please call [phone] to schedule an overdue appointment with Lavonna Prader, PA-C for future refills. Thank you. 30 tablet 11   metoprolol  succinate (TOPROL -XL) 25 MG 24 hr tablet TAKE 1 TABLET BY MOUTH ONCE  DAILY 15 tablet 0   Multiple Vitamin (MULTIVITAMIN) tablet Take 1 tablet by mouth daily after supper.     Multiple Vitamins-Minerals (PRESERVISION AREDS 2 PO) Take 1 tablet by mouth 2 (two) times daily.  rosuvastatin  (CRESTOR ) 5 MG tablet TAKE ONE-HALF TABLET BY MOUTH 3  TIMES WEEKLY 6 tablet 10   sertraline  (ZOLOFT ) 25 MG tablet TAKE 1 TABLET BY MOUTH DAILY 90 tablet 3   Carboxymethylcellul-Glycerin (LUBRICATING EYE DROPS OP) Place 1 drop into both eyes daily as needed (dry eyes).     Facility-Administered Medications Prior to Visit  Medication Dose Route Frequency Provider Last Rate Last Admin   cyanocobalamin  (VITAMIN B12) injection 1,000 mcg  1,000 mcg Intramuscular Once Sylvan Evener, MD        PAST MEDICAL HISTORY: Past Medical History:  Diagnosis Date   Abdominal pain    Abnormal stress test 09/15/2010   SEPTAL DEFECT SECONDARY TO PREVIOUS INFARCT VS LBBB; MILD SEPTAL ISCHEMIA   Acid reflux    Arthritis    CAD (coronary artery disease)    cath in 2007 reveals mild diffuse CAD   Colon polyps    Complete heart block (HCC) 11/2009   has PTVP in place   Diverticulosis    Glucose intolerance (impaired glucose tolerance)    Hemorrhoids    Hiatal hernia    History of colonoscopy    HTN (hypertension)    Hypercholesterolemia    Macular degeneration    left eye.   Memory impairment    better with lower dose statin   Pacemaker    Medtronic   PVC's (premature ventricular contractions)    Sinusitis    Sleep apnea    CPAP nightly   Ventral hernia     PAST SURGICAL HISTORY: Past Surgical History:  Procedure Laterality Date   APPENDECTOMY     CARDIAC  CATHETERIZATION  07/29/2005   EF 50-55%; Has mild diffuse CAD   cataract surgery  06/2009, 10/2014   Dr. Matthew Songster   COLONOSCOPY     CPAP     EYE SURGERY     INSERT / REPLACE / REMOVE PACEMAKER  11/18/2009   w/ Dr. Ollis Bi   LBBB     NOSE SURGERY     PACEMAKER INSERTION  11/18/09   MDT implanted by Dr Ollis Bi   REFRACTIVE SURGERY Bilateral    right nephrectomy  01/2006   partial for mass   SHOULDER ARTHROSCOPY Right 08/22/2020   Procedure: RIGHT SHOULDER ARTHROSCOPY, DEBRIDEMENT, AND DECOMPRESSION;  Surgeon: Timothy Ford, MD;  Location: [facility] OR;  Service: Orthopedics;  Laterality: Right;   TONSILLECTOMY      FAMILY HISTORY: Family History  Problem Relation Age of Onset   Leukemia Mother    Dementia Mother    Heart failure Father    Sleep apnea Neg Hx     SOCIAL HISTORY: Social History   Socioeconomic History   Marital status: Married    Spouse name: Blanca Bunch   Number of children: 4   Years of education: 12   Highest education level: Not on file  Occupational History   Occupation: Retired    Comment: [employer]  Tobacco Use   Smoking status: Former    Current packs/day: 0.00    Types: Cigarettes    Quit date: 07/06/1983    Years since quitting: 40.3   Smokeless tobacco: Never   Tobacco comments:    stopped smoking in 1985  Vaping Use   Vaping status: Never Used  Substance and Sexual Activity   Alcohol use: No   Drug use: No   Sexual activity: Not Currently  Other Topics Concern   Not on file  Social History Narrative   Retired from Environmental health practitioner.  Lives at [facility] 10-04-2023.  Independent living.    Caffeine use- morning coffee and 2 cup/ 1 cup of tea at lunch    Social Drivers of Health   Financial Resource Strain: Low Risk  (04/04/2023)   Overall Financial Resource Strain (CARDIA)    Difficulty of Paying Living Expenses: Not very hard  Food Insecurity: No Food Insecurity (04/04/2023)   Hunger Vital Sign    Worried About Running Out of Food in  the Last Year: Never true    Ran Out of Food in the Last Year: Never true  Transportation Needs: No Transportation Needs (04/04/2023)   PRAPARE - Administrator, Civil Service (Medical): No    Lack of Transportation (Non-Medical): No  Physical Activity: Inactive (04/04/2023)   Exercise Vital Sign    Days of Exercise per Week: 0 days    Minutes of Exercise per Session: 0 min  Stress: No Stress Concern Present (04/04/2023)   [employer] of Occupational Health - Occupational Stress Questionnaire    Feeling of Stress : Not at all  Social Connections: Moderately Integrated (04/04/2023)   Social Connection and Isolation Panel [NHANES]    Frequency of Communication with Friends and Family: More than three times a week    Frequency of Social Gatherings with Friends and Family: Once a week    Attends Religious Services: More than 4 times per year    Active Member of [employer] or Organizations: Yes    Attends Banker Meetings: More than 4 times per year    Marital Status: Widowed  Intimate Partner Violence: Not At Risk (04/04/2023)   Humiliation, Afraid, Rape, and Kick questionnaire    Fear of Current or Ex-Partner: No    Emotionally Abused: No    Physically Abused: No    Sexually Abused: No      PHYSICAL EXAM  Vitals:   10/26/23 1116  BP: 132/77  Pulse: 64  Weight: 194 lb (88 kg)  Height: 5\' 9"  (1.753 m)      Body mass index is 28.65 kg/m.      10/26/2023   11:26 AM 10/20/2022   10:42 AM 04/05/2022   10:37 AM  MMSE - Mini Mental State Exam  Orientation to time 2 3 3   Orientation to Place 4 5 4   Registration 3 3 3   Attention/ Calculation 3 3 2   Recall 0 0 0  Language- name 2 objects 2 2 2   Language- repeat 0 0 0  Language- follow 3 step command 3 3 3   Language- read & follow direction 1 1 1   Write a sentence 1 0 1  Copy design 1 1 0  Total score 20 21 19      Generalized: Well developed, in no acute distress   Neurological examination   Mentation: Alert oriented to time, place, history taking. Follows all commands speech and language fluent Cranial nerve II-XII: Pupils were equal round reactive to light. Extraocular movements were full, visual field were full on confrontational test.  Head turning and shoulder shrug  were normal and symmetric. Motor: The motor testing reveals 5 over 5 strength of all 4 extremities. Good symmetric motor tone is noted throughout.  Sensory: Sensory testing is intact to soft touch on all 4 extremities. No evidence of extinction is noted.  Coordination: Cerebellar testing reveals good finger-nose-finger and heel-to-shin bilaterally.  Gait and station: Gait is normal.  Reflexes: Deep tendon reflexes are symmetric and normal bilaterally.   DIAGNOSTIC DATA (LABS, IMAGING,  TESTING) - I reviewed patient records, labs, notes, testing and imaging myself where available.  Lab Results  Component Value Date   WBC 5.4 03/31/2023   HGB 12.6 (L) 03/31/2023   HCT 39.7 03/31/2023   MCV 96.1 03/31/2023   PLT 266 03/31/2023      Component Value Date/Time   NA 141 03/31/2023 0935   NA 142 10/01/2019 1113   K 5.0 03/31/2023 0935   CL 108 03/31/2023 0935   CO2 25 03/31/2023 0935   GLUCOSE 96 03/31/2023 0935   BUN 19 03/31/2023 0935   BUN 19 10/01/2019 1113   CREATININE 0.92 03/31/2023 0935   CALCIUM  9.9 03/31/2023 0935   PROT 6.2 03/31/2023 0935   PROT 6.2 10/01/2019 1113   ALBUMIN 4.2 10/01/2019 1113   AST 16 03/31/2023 0935   ALT 13 03/31/2023 0935   ALKPHOS 57 10/01/2019 1113   BILITOT 0.4 03/31/2023 0935   BILITOT 0.3 10/01/2019 1113   GFRNONAA >60 08/20/2020 1143   GFRNONAA 67 03/27/2020 0910   GFRAA 78 03/27/2020 0910   Lab Results  Component Value Date   CHOL 132 03/31/2023   HDL 42 03/31/2023   LDLCALC 69 03/31/2023   TRIG 133 03/31/2023   CHOLHDL 3.1 03/31/2023   Lab Results  Component Value Date   HGBA1C 5.9 (H) 03/31/2023   Lab Results  Component Value Date   VITAMINB12  1,038 06/23/2023   Lab Results  Component Value Date   TSH 1.53 12/11/2018      ASSESSMENT AND PLAN 83 y.o. year old male  has a past medical history of Abdominal pain, Abnormal stress test (09/15/2010), Acid reflux, Arthritis, CAD (coronary artery disease), Colon polyps, Complete heart block (HCC) (11/2009), Diverticulosis, Glucose intolerance (impaired glucose tolerance), Hemorrhoids, Hiatal hernia, History of colonoscopy, HTN (hypertension), Hypercholesterolemia, Macular degeneration, Memory impairment, Pacemaker, PVC's (premature ventricular contractions), Sinusitis, Sleep apnea, and Ventral hernia. here with:  1.  Memory disturbance  Memory score MMSE 20/30 stable Advised that I recommended that he not drive  Continue Aricept  5 mg at bedtime refilled by PCP  2.  Obstructive sleep apnea on CPAP  Encouraged patient to restart CPAP. Use nightly and greater than 4 hours each night   Follow-up in 6-7 months  or sooner if needed   Clem Currier, MSN, NP-C 10/26/2023, 11:29 AM [facility] [address] [place], [place] [address] [phone]

## 2023-10-26 NOTE — Progress Notes (Signed)
 This encounter was created in error - please disregard.

## 2023-10-26 NOTE — Progress Notes (Signed)
 rescheduled

## 2023-10-26 NOTE — Patient Instructions (Addendum)
 Your Plan:  Restart CPAP  Continue Aricept   Recommend that you not drive .  Thank you for coming to see us  at [facility]. I hope we have been able to provide you high quality care today.  You may receive a patient satisfaction survey over the next few weeks. We would appreciate your feedback and comments so that we may continue to improve ourselves and the health of our patients.

## 2023-10-28 NOTE — Telephone Encounter (Signed)
 No answer on home phone, no voicemail.  Left message for dtr to call back.

## 2023-11-03 NOTE — Telephone Encounter (Signed)
 Left message to call back

## 2023-11-04 NOTE — Telephone Encounter (Signed)
 LM for Daughter to call back. Left my direct number for her to call.

## 2023-11-04 NOTE — Telephone Encounter (Signed)
 Daughter [name] returning call,requesting cb

## 2023-11-08 NOTE — Telephone Encounter (Signed)
 Spoke with pt's daughter - he is scheduled on 6/6 at 2:30 pm for PPM Gen change with Dr. Lawana Pray.   I gave her the number to call Cassie to get an appt scheduled with Dr. Lawana Pray prior to his procedure. She will call her after her meeting this afternoon.   I informed her that we would get pre-procedure labs when he comes in for his appt and go over instructions at that time also. He will also need CHG soap.

## 2023-11-11 ENCOUNTER — Other Ambulatory Visit: Payer: Self-pay | Admitting: Physician Assistant

## 2023-11-14 ENCOUNTER — Ambulatory Visit (INDEPENDENT_AMBULATORY_CARE_PROVIDER_SITE_OTHER): Payer: Medicare Other

## 2023-11-14 DIAGNOSIS — I442 Atrioventricular block, complete: Secondary | ICD-10-CM

## 2023-11-16 ENCOUNTER — Encounter: Payer: Self-pay | Admitting: Internal Medicine

## 2023-11-16 ENCOUNTER — Ambulatory Visit: Payer: Medicare Other

## 2023-11-16 VITALS — BP 130/80 | HR 72 | Temp 98.0°F | Ht 69.0 in | Wt 186.5 lb

## 2023-11-16 DIAGNOSIS — E538 Deficiency of other specified B group vitamins: Secondary | ICD-10-CM

## 2023-11-16 DIAGNOSIS — Z125 Encounter for screening for malignant neoplasm of prostate: Secondary | ICD-10-CM

## 2023-11-16 DIAGNOSIS — I10 Essential (primary) hypertension: Secondary | ICD-10-CM

## 2023-11-16 DIAGNOSIS — R82998 Other abnormal findings in urine: Secondary | ICD-10-CM | POA: Diagnosis not present

## 2023-11-16 DIAGNOSIS — F09 Unspecified mental disorder due to known physiological condition: Secondary | ICD-10-CM

## 2023-11-16 LAB — CUP PACEART REMOTE DEVICE CHECK
Battery Impedance: 10243 Ohm
Battery Voltage: 2.57 V
Brady Statistic RV Percent Paced: 100 %
Date Time Interrogation Session: 20250514104236
Implantable Lead Connection Status: 753985
Implantable Lead Connection Status: 753985
Implantable Lead Implant Date: 20110517
Implantable Lead Implant Date: 20110517
Implantable Lead Location: 753859
Implantable Lead Location: 753860
Implantable Lead Model: 4469
Implantable Lead Model: 4470
Implantable Lead Serial Number: 543103
Implantable Lead Serial Number: 672341
Implantable Pulse Generator Implant Date: 20110517
Lead Channel Impedance Value: 67 Ohm
Lead Channel Impedance Value: 702 Ohm
Lead Channel Setting Pacing Amplitude: 2.5 V
Lead Channel Setting Pacing Pulse Width: 0.4 ms
Lead Channel Setting Sensing Sensitivity: 2 mV
Zone Setting Status: 755011
Zone Setting Status: 755011

## 2023-11-16 LAB — POCT URINALYSIS DIP (CLINITEK)
Blood, UA: NEGATIVE
Glucose, UA: NEGATIVE mg/dL
Ketones, POC UA: NEGATIVE mg/dL
Leukocytes, UA: NEGATIVE
Nitrite, UA: NEGATIVE
POC PROTEIN,UA: 30 — AB
Spec Grav, UA: 1.02 (ref 1.010–1.025)
Urobilinogen, UA: 0.2 U/dL
pH, UA: 6 (ref 5.0–8.0)

## 2023-11-16 MED ORDER — CYANOCOBALAMIN 1000 MCG/ML IJ SOLN
1000.0000 ug | Freq: Once | INTRAMUSCULAR | Status: AC
  Administered 2023-11-16: 1000 ug via INTRAMUSCULAR

## 2023-11-16 NOTE — Progress Notes (Signed)
 Patient presents to the clinic for a monthly B12 injection for history of B12 deficiency.   1 cc IM Vitamin B12 given by CMA, right upper quadrant.    RTC in one month for another injection.  MY Note: Patient also seen today regarding c/o of dark urine. Now  residing at Assisted living facility. No c/o of pain on urination or increased frequency. No fever, chils or frank bright red blood per rectum. No CVA tenderness. Meds reviewed. CBC with diff drawn and is WNL. C-met is normal. CBC with diff is normal.PSA is normal. Urine does have dark orange color. Has small bilirubin and proteinuria. If symptoms persist, will need additional evaluation. Daughter to let us  know if further evaluation is needed.  He sees Urology for urinary frequency and is on Myrbetriq and Flomax .  History of hyperlipidemia treated with statin.  History of hypertension.  He is followed by [facility] cardiology.  History of complete heart block with pacemaker in place.  History of right shoulder decompression and debridement by Dr. Julio Ohm in February 2022.  History of sleep apnea treated with CPAP.  Mild memory loss and has had some issues driving.  No longer driving and is residing in assisted living facility.  Has a number of drug intolerances and most of these are intolerances rather than true allergies.  History of right partial nephrectomy for renal mass by Dr. Ottelin in 2012.  Pacemaker in place for Mobitz type II AV block.  Cardiac cath 2007 showing mild diffuse coronary artery disease.  History of macular degeneration followed by ophthalmology.  Social history: Widower.  Family history: Father deceased due to heart failure.  Mother deceased due to leukemia.    Objective: Skin: Chest clear. Cor:RRR  warm and dry. No scleral icterus. No hepatosplenomegaly. Alert and cooperative.Abdomen is without palpable masses or tenderness.  Imp: dark urine B12 deficiency Mild cognitive disorder HTN  Plan: Daughter will monitor urine  color and encourage hydration. RTC one month for next B12 injection.

## 2023-11-17 ENCOUNTER — Ambulatory Visit: Payer: Self-pay | Admitting: Internal Medicine

## 2023-11-17 LAB — CBC WITH DIFFERENTIAL/PLATELET
Absolute Lymphocytes: 1643 {cells}/uL (ref 850–3900)
Absolute Monocytes: 518 {cells}/uL (ref 200–950)
Basophils Absolute: 37 {cells}/uL (ref 0–200)
Basophils Relative: 0.5 %
Eosinophils Absolute: 118 {cells}/uL (ref 15–500)
Eosinophils Relative: 1.6 %
HCT: 43.5 % (ref 38.5–50.0)
Hemoglobin: 14.5 g/dL (ref 13.2–17.1)
MCH: 32.8 pg (ref 27.0–33.0)
MCHC: 33.3 g/dL (ref 32.0–36.0)
MCV: 98.4 fL (ref 80.0–100.0)
MPV: 11.7 fL (ref 7.5–12.5)
Monocytes Relative: 7 %
Neutro Abs: 5084 {cells}/uL (ref 1500–7800)
Neutrophils Relative %: 68.7 %
Platelets: 203 10*3/uL (ref 140–400)
RBC: 4.42 10*6/uL (ref 4.20–5.80)
RDW: 13.2 % (ref 11.0–15.0)
Total Lymphocyte: 22.2 %
WBC: 7.4 10*3/uL (ref 3.8–10.8)

## 2023-11-17 LAB — COMPLETE METABOLIC PANEL WITHOUT GFR
AG Ratio: 2 (calc) (ref 1.0–2.5)
ALT: 15 U/L (ref 9–46)
AST: 19 U/L (ref 10–35)
Albumin: 4.3 g/dL (ref 3.6–5.1)
Alkaline phosphatase (APISO): 52 U/L (ref 35–144)
BUN: 22 mg/dL (ref 7–25)
CO2: 22 mmol/L (ref 20–32)
Calcium: 10 mg/dL (ref 8.6–10.3)
Chloride: 110 mmol/L (ref 98–110)
Creat: 1.15 mg/dL (ref 0.70–1.22)
Globulin: 2.2 g/dL (ref 1.9–3.7)
Glucose, Bld: 105 mg/dL — ABNORMAL HIGH (ref 65–99)
Potassium: 4.8 mmol/L (ref 3.5–5.3)
Sodium: 140 mmol/L (ref 135–146)
Total Bilirubin: 0.4 mg/dL (ref 0.2–1.2)
Total Protein: 6.5 g/dL (ref 6.1–8.1)

## 2023-11-17 LAB — PSA: PSA: 0.69 ng/mL (ref <=4.00)

## 2023-11-18 LAB — URINE CULTURE
MICRO NUMBER:: 16454594
SPECIMEN QUALITY:: ADEQUATE

## 2023-11-21 ENCOUNTER — Ambulatory Visit: Payer: Self-pay | Admitting: Cardiology

## 2023-11-21 ENCOUNTER — Encounter: Payer: Self-pay | Admitting: Internal Medicine

## 2023-11-21 NOTE — Patient Instructions (Signed)
 Patient will return in 4 weeks for another B12 injection.  Daughter will monitor his hydration status and color of his urine.  Labs are stable.

## 2023-11-21 NOTE — H&P (View-Only) (Signed)
  Electrophysiology Office Note:   Date:  11/22/2023  ID:  DAOUDA Ramsey, DOB September 11, 1940, MRN [id number]  Primary Cardiologist: None Primary Heart Failure: None Electrophysiologist: Neng Albee Cortland Ding, MD      History of Present Illness:   Robert Ramsey is a 83 y.o. male with h/o complete heart block, left bundle branch block, hypertension, hyperlipidemia, sleep apnea seen today for routine electrophysiology followup.   Since last being seen in our clinic the patient reports doing well.  He has no chest pain or shortness of breath.  He is able to do all of his daily activities.  His device is now at [facility].  He has plans for generator change 12/09/2023..  he denies chest pain, palpitations, dyspnea, PND, orthopnea, nausea, vomiting, dizziness, syncope, edema, weight gain, or early satiety.   Review of systems complete and found to be negative unless listed in HPI.      EP Information / Studies Reviewed:    EKG is ordered today. Personal review as below.  EKG Interpretation Date/Time:  Tuesday Nov 22 2023 09:33:41 EDT Ventricular Rate:  67 PR Interval:    QRS Duration:  188 QT Interval:  470 QTC Calculation: 496 R Axis:   -75  Text Interpretation: Sinus rhythm with complete heart block and Ventricular-paced rhythm with occasional Premature ventricular complexes When compared with ECG of 11-May-2023 07:54, Sinus rhythm is now with complete heart block Vent. rate has decreased BY  12 BPM Confirmed by Kaeli Nichelson ([id number]) on 11/22/2023 9:51:58 AM   PPM Interrogation-  reviewed in detail today,  See PACEART report.  Device History: Medtronic Dual Chamber PPM implanted 11/18/2009 for CHB  Risk Assessment/Calculations:             Physical Exam:   VS:  BP 136/70 (BP Location: Right Arm, Patient Position: Sitting, Cuff Size: Normal)   Pulse 68   Ht 5\' 9"  (1.753 m)   Wt 180 lb (81.6 kg)   SpO2 96%   BMI 26.58 kg/m    Wt Readings from Last 3 Encounters:  11/22/23 180 lb (81.6  kg)  11/16/23 186 lb 8 oz (84.6 kg)  10/26/23 194 lb (88 kg)     GEN: Well nourished, well developed in no acute distress NECK: No JVD; No carotid bruits CARDIAC: Regular rate and rhythm, no murmurs, rubs, gallops RESPIRATORY:  Clear to auscultation without rales, wheezing or rhonchi  ABDOMEN: Soft, non-tender, non-distended EXTREMITIES:  No edema; No deformity   ASSESSMENT AND PLAN:    CHB s/p Medtronic PPM  Normal PPM function Sensing, threshold, impedance within normal limits Programming reviewed and appropriate See Valeta Gaudier Art report Device is at [employer].  He Trenae Brunke need generator change.  Risk and benefits have been discussed.  He understands the risks and is agreed to the procedure.  Explained risks, benefits, and alternatives to generator change, including but not limited to bleeding and infection. Pt verbalized understanding and agrees to proceed.  2.  Hypertension: Well-controlled  3.  Cardiomyopathy: Ejection fraction 45%.  No heart failure symptoms.  Continue current management.  Disposition:   Follow up with [facility] as usual post procedure  Signed, Juan Kissoon Cortland Ding, MD

## 2023-11-21 NOTE — Progress Notes (Signed)
  Electrophysiology Office Note:   Date:  11/22/2023  ID:  Robert Ramsey, DOB September 11, 1940, MRN [id number]  Primary Cardiologist: None Primary Heart Failure: None Electrophysiologist: Neng Albee Cortland Ding, MD      History of Present Illness:   Robert Ramsey is a 83 y.o. male with h/o complete heart block, left bundle branch block, hypertension, hyperlipidemia, sleep apnea seen today for routine electrophysiology followup.   Since last being seen in our clinic the patient reports doing well.  He has no chest pain or shortness of breath.  He is able to do all of his daily activities.  His device is now at [facility].  He has plans for generator change 12/09/2023..  he denies chest pain, palpitations, dyspnea, PND, orthopnea, nausea, vomiting, dizziness, syncope, edema, weight gain, or early satiety.   Review of systems complete and found to be negative unless listed in HPI.      EP Information / Studies Reviewed:    EKG is ordered today. Personal review as below.  EKG Interpretation Date/Time:  Tuesday Nov 22 2023 09:33:41 EDT Ventricular Rate:  67 PR Interval:    QRS Duration:  188 QT Interval:  470 QTC Calculation: 496 R Axis:   -75  Text Interpretation: Sinus rhythm with complete heart block and Ventricular-paced rhythm with occasional Premature ventricular complexes When compared with ECG of 11-May-2023 07:54, Sinus rhythm is now with complete heart block Vent. rate has decreased BY  12 BPM Confirmed by Kaeli Nichelson ([id number]) on 11/22/2023 9:51:58 AM   PPM Interrogation-  reviewed in detail today,  See PACEART report.  Device History: Medtronic Dual Chamber PPM implanted 11/18/2009 for CHB  Risk Assessment/Calculations:             Physical Exam:   VS:  BP 136/70 (BP Location: Right Arm, Patient Position: Sitting, Cuff Size: Normal)   Pulse 68   Ht 5\' 9"  (1.753 m)   Wt 180 lb (81.6 kg)   SpO2 96%   BMI 26.58 kg/m    Wt Readings from Last 3 Encounters:  11/22/23 180 lb (81.6  kg)  11/16/23 186 lb 8 oz (84.6 kg)  10/26/23 194 lb (88 kg)     GEN: Well nourished, well developed in no acute distress NECK: No JVD; No carotid bruits CARDIAC: Regular rate and rhythm, no murmurs, rubs, gallops RESPIRATORY:  Clear to auscultation without rales, wheezing or rhonchi  ABDOMEN: Soft, non-tender, non-distended EXTREMITIES:  No edema; No deformity   ASSESSMENT AND PLAN:    CHB s/p Medtronic PPM  Normal PPM function Sensing, threshold, impedance within normal limits Programming reviewed and appropriate See Valeta Gaudier Art report Device is at [employer].  He Trenae Brunke need generator change.  Risk and benefits have been discussed.  He understands the risks and is agreed to the procedure.  Explained risks, benefits, and alternatives to generator change, including but not limited to bleeding and infection. Pt verbalized understanding and agrees to proceed.  2.  Hypertension: Well-controlled  3.  Cardiomyopathy: Ejection fraction 45%.  No heart failure symptoms.  Continue current management.  Disposition:   Follow up with [facility] as usual post procedure  Signed, Juan Kissoon Cortland Ding, MD

## 2023-11-22 ENCOUNTER — Ambulatory Visit: Attending: Cardiology | Admitting: Cardiology

## 2023-11-22 ENCOUNTER — Encounter: Payer: Self-pay | Admitting: Cardiology

## 2023-11-22 VITALS — BP 136/70 | HR 68 | Ht 69.0 in | Wt 180.0 lb

## 2023-11-22 DIAGNOSIS — I442 Atrioventricular block, complete: Secondary | ICD-10-CM

## 2023-11-22 DIAGNOSIS — I5022 Chronic systolic (congestive) heart failure: Secondary | ICD-10-CM

## 2023-11-22 DIAGNOSIS — I429 Cardiomyopathy, unspecified: Secondary | ICD-10-CM | POA: Diagnosis not present

## 2023-11-22 DIAGNOSIS — Z950 Presence of cardiac pacemaker: Secondary | ICD-10-CM | POA: Diagnosis not present

## 2023-11-22 DIAGNOSIS — I10 Essential (primary) hypertension: Secondary | ICD-10-CM | POA: Diagnosis not present

## 2023-11-22 DIAGNOSIS — Z01812 Encounter for preprocedural laboratory examination: Secondary | ICD-10-CM

## 2023-11-22 NOTE — Addendum Note (Signed)
 Addended by: Alvenia Aus on: 11/22/2023 10:29 AM   Modules accepted: Orders

## 2023-11-23 LAB — CBC
Hematocrit: 44.4 % (ref 37.5–51.0)
Hemoglobin: 14.3 g/dL (ref 13.0–17.7)
MCH: 32.1 pg (ref 26.6–33.0)
MCHC: 32.2 g/dL (ref 31.5–35.7)
MCV: 100 fL — ABNORMAL HIGH (ref 79–97)
Platelets: 215 10*3/uL (ref 150–450)
RBC: 4.45 x10E6/uL (ref 4.14–5.80)
RDW: 13.2 % (ref 11.6–15.4)
WBC: 6.8 10*3/uL (ref 3.4–10.8)

## 2023-11-23 LAB — BASIC METABOLIC PANEL WITH GFR
BUN/Creatinine Ratio: 19 (ref 10–24)
BUN: 20 mg/dL (ref 8–27)
CO2: 20 mmol/L (ref 20–29)
Calcium: 10 mg/dL (ref 8.6–10.2)
Chloride: 106 mmol/L (ref 96–106)
Creatinine, Ser: 1.06 mg/dL (ref 0.76–1.27)
Glucose: 84 mg/dL (ref 70–99)
Potassium: 4.8 mmol/L (ref 3.5–5.2)
Sodium: 143 mmol/L (ref 134–144)
eGFR: 69 mL/min/{1.73_m2} (ref >59)

## 2023-12-02 NOTE — Addendum Note (Signed)
 Addended by: Lott Rouleau A on: 12/02/2023 01:09 PM   Modules accepted: Orders, Level of Service

## 2023-12-02 NOTE — Progress Notes (Signed)
 Remote pacemaker transmission.

## 2023-12-05 ENCOUNTER — Telehealth (HOSPITAL_COMMUNITY): Payer: Self-pay

## 2023-12-05 NOTE — Telephone Encounter (Signed)
 Attempted to reach patient to discuss upcoming procedure, no answer. Left VM for patient to return call.

## 2023-12-06 NOTE — Telephone Encounter (Signed)
 Patient's daughter Robert Ramsey returned call to discuss upcoming procedure/DPR on file.   Confirmed patient is scheduled for a PPM generator change on Friday, June 6 with Dr. Agatha Horsfall. Instructed patient to arrive at the Main Entrance A at [facility]: [address] [place], [place] [address] and check in at Admitting at 12:30 PM.   Labs completed  Any recent signs of acute illness or been started on antibiotics?   Any new medications started? Medication instructions:  On the morning of your procedure do not take Aspirin. You may take all other morning medications with a sip of water.  You may have a LIGHT breakfast the morning of your procedure before 7 AM. Do not eat or drink after 7 AM.  The night before your procedure and the morning of your procedure, wash thoroughly with the CHG surgical soap from the neck down, paying special attention to the area where your procedure will be performed.  Advised of plan to go home the same day and will only stay overnight if medically necessary. You MUST have a responsible adult to drive you home and MUST be with you the first 24 hours after you arrive home.  Robert Ramsey verbalized understanding to all instructions provided and agreed to proceed with procedure.

## 2023-12-07 ENCOUNTER — Encounter: Payer: Self-pay | Admitting: Emergency Medicine

## 2023-12-08 NOTE — Pre-Procedure Instructions (Signed)
 Attempted to call patient regarding procedure instructions.  Left voice mail on [name], daughter cell #.  Instructed on the following items: Arrival time 1230 Nothing to eat or drink after midnight No meds AM of procedure Responsible person to drive you home and stay with you for 24 hrs Wash with special soap night before and morning of procedure If on anti-coagulant drug instructions No ASA am of procedure.

## 2023-12-09 ENCOUNTER — Other Ambulatory Visit: Payer: Self-pay

## 2023-12-09 ENCOUNTER — Encounter (HOSPITAL_COMMUNITY)
Admission: RE | Disposition: A | Discharge status: Home / Self Care | Payer: Self-pay | Source: Home / Self Care | Attending: Cardiology

## 2023-12-09 ENCOUNTER — Ambulatory Visit (HOSPITAL_COMMUNITY)
Admission: RE | Admit: 2023-12-09 | Discharge: 2023-12-09 | Disposition: A | Discharge status: Home / Self Care | Attending: Cardiology | Admitting: Cardiology

## 2023-12-09 DIAGNOSIS — Z45010 Encounter for checking and testing of cardiac pacemaker pulse generator [battery]: Secondary | ICD-10-CM

## 2023-12-09 DIAGNOSIS — R001 Bradycardia, unspecified: Secondary | ICD-10-CM

## 2023-12-09 DIAGNOSIS — I429 Cardiomyopathy, unspecified: Secondary | ICD-10-CM | POA: Insufficient documentation

## 2023-12-09 DIAGNOSIS — I10 Essential (primary) hypertension: Secondary | ICD-10-CM | POA: Insufficient documentation

## 2023-12-09 HISTORY — PX: PPM GENERATOR CHANGEOUT: EP1233

## 2023-12-09 SURGERY — PPM GENERATOR CHANGEOUT

## 2023-12-09 MED ORDER — ONDANSETRON HCL 4 MG/2ML IJ SOLN: 4.0000 mg | Freq: Four times a day (QID) | INTRAMUSCULAR | Status: DC | PRN

## 2023-12-09 MED ORDER — SODIUM CHLORIDE 0.9 % IV SOLN: INTRAVENOUS | Status: DC

## 2023-12-09 MED ORDER — VANCOMYCIN HCL IN DEXTROSE 1-5 GM/200ML-% IV SOLN
INTRAVENOUS | Status: AC
  Filled 2023-12-09: qty 200

## 2023-12-09 MED ORDER — VANCOMYCIN HCL IN DEXTROSE 1-5 GM/200ML-% IV SOLN
1000.0000 mg | INTRAVENOUS | Status: AC
  Administered 2023-12-09: 1000 mg via INTRAVENOUS

## 2023-12-09 MED ORDER — CEFAZOLIN SODIUM-DEXTROSE 2-4 GM/100ML-% IV SOLN: 2.0000 g | INTRAVENOUS | Status: DC

## 2023-12-09 MED ORDER — SODIUM CHLORIDE 0.9 % IV SOLN
INTRAVENOUS | Status: AC
  Filled 2023-12-09: qty 2

## 2023-12-09 MED ORDER — ACETAMINOPHEN 325 MG PO TABS: 325.0000 mg | ORAL_TABLET | ORAL | Status: DC | PRN

## 2023-12-09 MED ORDER — SODIUM CHLORIDE 0.9% FLUSH: 3.0000 mL | Freq: Two times a day (BID) | INTRAVENOUS | Status: DC

## 2023-12-09 MED ORDER — CHLORHEXIDINE GLUCONATE 4 % EX SOLN: 4.0000 | Freq: Once | CUTANEOUS | Status: DC

## 2023-12-09 MED ORDER — POVIDONE-IODINE 10 % EX SWAB: 2.0000 | Freq: Once | CUTANEOUS | Status: DC

## 2023-12-09 MED ORDER — SODIUM CHLORIDE 0.9 % IV SOLN
80.0000 mg | INTRAVENOUS | Status: AC
  Administered 2023-12-09: 80 mg

## 2023-12-09 MED ORDER — SODIUM CHLORIDE 0.9% FLUSH: 3.0000 mL | INTRAVENOUS | Status: DC | PRN

## 2023-12-09 MED ORDER — LIDOCAINE HCL (PF) 1 % IJ SOLN
INTRAMUSCULAR | Status: DC | PRN
  Administered 2023-12-09: 10 mL
  Administered 2023-12-09: 30 mL

## 2023-12-09 MED ORDER — LIDOCAINE HCL (PF) 1 % IJ SOLN
INTRAMUSCULAR | Status: AC
  Filled 2023-12-09: qty 90

## 2023-12-09 SURGICAL SUPPLY — 6 items
CABLE SURGICAL S-101-97-12 (CABLE) ×1 IMPLANT
PACEMAKER ACCOLADE DR-EL (Pacemaker) IMPLANT
PAD DEFIB RADIO PHYSIO CONN (PAD) ×1 IMPLANT
POUCH AIGIS-R ANTIBACT PPM (Mesh General) ×1 IMPLANT
POUCH AIGIS-R ANTIBACT PPM MED (Mesh General) IMPLANT
TRAY PACEMAKER INSERTION (PACKS) ×1 IMPLANT

## 2023-12-09 NOTE — Discharge Instructions (Addendum)

## 2023-12-09 NOTE — Interval H&P Note (Signed)
 History and Physical Interval Note:  12/09/2023 12:37 PM  Robert Ramsey  has presented today for surgery, with the diagnosis of ERI.  The various methods of treatment have been discussed with the patient and family. After consideration of risks, benefits and other options for treatment, the patient has consented to  Procedure(s): PPM GENERATOR CHANGEOUT (N/A) as a surgical intervention.  The patient's history has been reviewed, patient examined, no change in status, stable for surgery.  I have reviewed the patient's chart and labs.  Questions were answered to the patient's satisfaction.     Guillaume Weninger [employer]

## 2023-12-12 ENCOUNTER — Encounter (HOSPITAL_COMMUNITY): Payer: Self-pay | Admitting: Cardiology

## 2023-12-14 ENCOUNTER — Ambulatory Visit (INDEPENDENT_AMBULATORY_CARE_PROVIDER_SITE_OTHER): Payer: Medicare Other | Admitting: Internal Medicine

## 2023-12-14 DIAGNOSIS — E538 Deficiency of other specified B group vitamins: Secondary | ICD-10-CM

## 2023-12-14 DIAGNOSIS — I10 Essential (primary) hypertension: Secondary | ICD-10-CM

## 2023-12-14 DIAGNOSIS — R413 Other amnesia: Secondary | ICD-10-CM | POA: Diagnosis not present

## 2023-12-14 DIAGNOSIS — R82998 Other abnormal findings in urine: Secondary | ICD-10-CM

## 2023-12-14 LAB — POCT URINALYSIS DIP (CLINITEK)
Bilirubin, UA: NEGATIVE
Blood, UA: NEGATIVE
Glucose, UA: NEGATIVE mg/dL
Leukocytes, UA: NEGATIVE
Nitrite, UA: NEGATIVE
Spec Grav, UA: 1.025 (ref 1.010–1.025)
Urobilinogen, UA: 0.2 U/dL
pH, UA: 5 (ref 5.0–8.0)

## 2023-12-14 MED ORDER — CYANOCOBALAMIN 1000 MCG/ML IJ SOLN
1000.0000 ug | Freq: Once | INTRAMUSCULAR | Status: AC
  Administered 2023-12-14: 1000 ug via INTRAMUSCULAR

## 2023-12-14 NOTE — Patient Instructions (Signed)
 Patient received 1 cc IM Vitamin B12 injection today. Return in one month for another injection. Urine dipstick results show no bilirubin in urine but specific gravity is 1.025 indicating patient could drink more water. I think Vitamins patient takes are causing urine discoloration according to information on-line.

## 2023-12-14 NOTE — Progress Notes (Signed)
   Subjective:    Patient ID: Robert Ramsey, male    DOB: 11/01/1940, 83 y.o.   MRN: [id number]  HPI He is here accompanied by his daughter for B12 injection. Had generator change out by Dr. Collin Deal June 6 th. Surgical site is healing well. No fever and no drainage from the surgical site. Patient and daughter express concern about orange tinge to urine. He does take Preservision Vitamins. Information on-line indicates this is a  common with this vitamin and due to high dose Vitamin A.    Review of Systems see above.     Objective:   Physical Exam seen in exam room in no acute distress  Blood pressure 110/80, Alert and oriented. Has steri- strips over incision right upper chest wall. Healing well. No drainage. Pt advised to let strips fall off. Continue to keep wound clean and dry.  Urine checked today and dipstick shows SG 1.025, no occult blood present, No bili, no glucose, trace ketones. The specific gravity shows urine to be concentrated. Advise drinking plenty of fluids daily.      Assessment & Plan:   Orange tinged urine due to Vitamins in current Vitamin Supplement. Concentrated urine specific gravity SG 1.025. Stay well hydrated 2-3 8 oz glasses of fluid minimum daily- water is best. B12 deficiency:   Received 1cc B12 IM today in right buttock.  Plan: RTC in one month for another B12 injection. CPE and Medicare wellness exam booked for October 3.  I, Sylvan Evener, MD, have reviewed all documentation for this visit. The documentation on 12/14/23 for the exam, diagnosis, procedures, and orders are all accurate and complete.

## 2023-12-19 ENCOUNTER — Other Ambulatory Visit: Payer: Self-pay | Admitting: Physician Assistant

## 2023-12-20 ENCOUNTER — Ambulatory Visit

## 2023-12-22 ENCOUNTER — Ambulatory Visit: Attending: Cardiology

## 2023-12-22 DIAGNOSIS — I442 Atrioventricular block, complete: Secondary | ICD-10-CM | POA: Diagnosis not present

## 2023-12-22 NOTE — Progress Notes (Signed)
 Normal DUAL chamber pacemaker wound check. Presenting rhythm: AS/VP 68. Wound well healed. Routine testing performed. Thresholds, sensing, and impedances consistent with implant measurements and at 3.5V safety margin/auto capture until 3 month visit. No episodes. Reviewed arm restrictions to continue for 6 weeks total post op.  Pt enrolled in remote follow-up.

## 2023-12-22 NOTE — Patient Instructions (Signed)

## 2023-12-28 NOTE — Addendum Note (Signed)
 Addended by: TAWNI DRILLING D on: 12/28/2023 11:47 AM   Modules accepted: Orders

## 2023-12-28 NOTE — Progress Notes (Signed)
 Remote pacemaker transmission.

## 2024-01-11 ENCOUNTER — Ambulatory Visit: Payer: Medicare Other

## 2024-01-16 ENCOUNTER — Ambulatory Visit (INDEPENDENT_AMBULATORY_CARE_PROVIDER_SITE_OTHER)

## 2024-01-16 VITALS — HR 74 | Ht 69.0 in | Wt 184.0 lb

## 2024-01-16 DIAGNOSIS — E538 Deficiency of other specified B group vitamins: Secondary | ICD-10-CM | POA: Diagnosis not present

## 2024-01-16 MED ORDER — CYANOCOBALAMIN 1000 MCG/ML IJ SOLN
1000.0000 ug | Freq: Once | INTRAMUSCULAR | Status: AC
  Administered 2024-01-16: 1000 ug via INTRAMUSCULAR

## 2024-01-16 NOTE — Progress Notes (Signed)
 Patient presents to the clinic for a monthly B12 injection for history of B12 deficiency.   1 cc IM Vitamin B12 given by CMA, right upper quadrant. Patient tolerated well.   RTC in one month for another injection.

## 2024-01-16 NOTE — Progress Notes (Signed)
 B12 1 CC IM Given today by CMA. Return in one month.

## 2024-02-15 ENCOUNTER — Ambulatory Visit (INDEPENDENT_AMBULATORY_CARE_PROVIDER_SITE_OTHER): Payer: Medicare Other

## 2024-02-15 VITALS — BP 120/78 | HR 72 | Ht 69.0 in | Wt 184.0 lb

## 2024-02-15 DIAGNOSIS — E538 Deficiency of other specified B group vitamins: Secondary | ICD-10-CM

## 2024-02-15 MED ORDER — CYANOCOBALAMIN 1000 MCG/ML IJ SOLN
1000.0000 ug | Freq: Once | INTRAMUSCULAR | Status: AC
  Administered 2024-02-15 (×2): 1000 ug via INTRAMUSCULAR

## 2024-02-15 NOTE — Progress Notes (Signed)
 Patient presents to the clinic for a monthly B12 injection for history of B12 deficiency.   1 cc IM Vitamin B12 given by CMA, right upper quadrant. Patient tolerated well.   RTC in one month for another injection.

## 2024-03-11 NOTE — Progress Notes (Unsigned)
 Cardiology Office Note Date:  03/11/2024  Patient ID:  Robert Ramsey, Robert Ramsey [birth year], MRN [id number] PCP:  Perri Ronal JINNY, MD  Electrophysiologist: Dr. Kelsie >> Dr. Inocencio    Chief Complaint:    *** post gen change visit  History of Present Illness: Robert Ramsey is a 83 y.o. male with history of  LBBB, CHB w/PPM,  HTN, HLD, OSA w/CPAP  He comes in today to be seen for Dr. Kelsie, last seen by him Feb 2022, was doing well, no changes were made.  I saw him March 2023, He is doing very well. Lives independently and denies any difficulties with ADLs Has great family support, says his 4 daughters keep him well check in on. He no longer does his yard, but says he stays busy, not a couch potatoe! No CP, palpitations or cardiac awareness No  SOB No near syncope or syncope. His PMD does his labs Some degree of chronic edema, planned to update his echo given had been since 2007 and RV pacing 100%  LVEF remained 45%, Toprol  added with plans to see him back in 3-67mo  Seeing myself a couple times since then Most recently saw Dr. Inocencio 11/22/23, had reached ERI and planned for gen change No reported symptoms  TODAY  *** chronic outputs *** symptoms *** volume, EF 40's back to 2007.... No SOB, DOE, nocturnal symptoms No near syncope or syncope Denies orthostatic symptoms Has for years worn support stocking, none today with trace edema   Device information MDT dual chamber PPM implanted 11/18/2009, gen change 12/12/23   Past Medical History:  Diagnosis Date   Abdominal pain    Abnormal stress test 09/15/2010   SEPTAL DEFECT SECONDARY TO PREVIOUS INFARCT VS LBBB; MILD SEPTAL ISCHEMIA   Acid reflux    Arthritis    CAD (coronary artery disease)    cath in 2007 reveals mild diffuse CAD   Colon polyps    Complete heart block (HCC) 11/2009   has PTVP in place   Diverticulosis    Glucose intolerance (impaired glucose tolerance)    Hemorrhoids    Hiatal hernia    History  of colonoscopy    HTN (hypertension)    Hypercholesterolemia    Macular degeneration    left eye.   Memory impairment    better with lower dose statin   Pacemaker    Medtronic   PVC's (premature ventricular contractions)    Sinusitis    Sleep apnea    CPAP nightly   Ventral hernia     Past Surgical History:  Procedure Laterality Date   APPENDECTOMY     CARDIAC CATHETERIZATION  07/29/2005   EF 50-55%; Has mild diffuse CAD   cataract surgery  06/2009, 10/2014   Dr. Rosan   COLONOSCOPY     CPAP     EYE SURGERY     INSERT / REPLACE / REMOVE PACEMAKER  11/18/2009   w/ Dr. Tisa   LBBB     NOSE SURGERY     PACEMAKER INSERTION  11/18/09   MDT implanted by Dr Tisa   [facility] GENERATOR CHANGEOUT N/A 12/09/2023   Procedure: PPM GENERATOR CHANGEOUT;  Surgeon: Inocencio Soyla Lunger, MD;  Location: [facility] INVASIVE CV LAB;  Service: Cardiovascular;  Laterality: N/A;   REFRACTIVE SURGERY Bilateral    right nephrectomy  01/2006   partial for mass   SHOULDER ARTHROSCOPY Right 08/22/2020   Procedure: RIGHT SHOULDER ARTHROSCOPY, DEBRIDEMENT, AND DECOMPRESSION;  Surgeon: Harden Jerona GAILS, MD;  Location: [facility] OR;  Service: Orthopedics;  Laterality: Right;   TONSILLECTOMY      Current Outpatient Medications  Medication Sig Dispense Refill   aspirin 81 MG tablet Take 81 mg by mouth daily.     donepezil  (ARICEPT ) 5 MG tablet Take 1 tablet (5 mg total) by mouth at bedtime. 90 tablet 2   ezetimibe  (ZETIA ) 10 MG tablet TAKE 1 TABLET BY MOUTH DAILY 90 tablet 3   famotidine (PEPCID) 20 MG tablet Take 20 mg by mouth 2 (two) times daily.     losartan  (COZAAR ) 25 MG tablet Take 1 tablet (25 mg total) by mouth daily. Please call [phone] to schedule an overdue appointment with Charlies Bard, PA-C for future refills. Thank you. 30 tablet 11   metoprolol  succinate (TOPROL -XL) 25 MG 24 hr tablet Take 1 tablet (25 mg total) by mouth daily. 90 tablet 1   Multiple Vitamin (MULTIVITAMIN) tablet Take 1 tablet by  mouth daily after supper.     Multiple Vitamins-Minerals (PRESERVISION AREDS 2 PO) Take 1 tablet by mouth 2 (two) times daily.     MYRBETRIQ 50 MG TB24 tablet Take 50 mg by mouth daily.     rosuvastatin  (CRESTOR ) 5 MG tablet TAKE ONE-HALF TABLET BY MOUTH 3  TIMES WEEKLY 20 tablet 3   sertraline  (ZOLOFT ) 25 MG tablet TAKE 1 TABLET BY MOUTH DAILY 90 tablet 3   Current Facility-Administered Medications  Medication Dose Route Frequency Provider Last Rate Last Admin   cyanocobalamin  (VITAMIN B12) injection 1,000 mcg  1,000 mcg Intramuscular Once Baxley, Mary J, MD        Allergies:   Cinnamon, Flexeril  [cyclobenzaprine  hcl], Levofloxacin, Nabumetone, Naproxen, Nsaids, Vantin, Vioxx [rofecoxib], Acyclovir and related, and Imodium [loperamide hcl]   Social History:  The patient  reports that he quit smoking about 40 years ago. His smoking use included cigarettes. He has never used smokeless tobacco. He reports that he does not drink alcohol and does not use drugs.   Family History:  The patient's family history includes Dementia in his mother; Heart failure in his father; Leukemia in his mother.  ROS:  Please see the history of present illness.    All other systems are reviewed and otherwise negative.   PHYSICAL EXAM:  VS:  There were no vitals taken for this visit. BMI: There is no height or weight on file to calculate BMI. Well nourished, well developed, in no acute distress HEENT: normocephalic, atraumatic Neck: no JVD, carotid bruits or masses Cardiac: *** RRR; no significant murmurs, no rubs, or gallops Lungs:  *** CTA b/l, no wheezing, rhonchi or rales Abd: soft, nontender MS: no deformity or atrophy Ext: *** trace edema Skin: warm and dry, no rash Neuro:  No gross deficits appreciated Psych: euthymic mood, full affect  *** PPM site (R side) is stable, no tethering or discomfort   EKG: not done today  Device interrogation done today and reviewed by myself:  *** Battery and lead  measurements are good   09/11/21: TTE  1. Left ventricular ejection fraction by 3D volume is 45 %. The left  ventricle has mildly decreased function. The left ventricle demonstrates  global hypokinesis. Left ventricular diastolic function could not be  evaluated.   2. Right ventricular systolic function is normal. The right ventricular  size is normal. There is normal pulmonary artery systolic pressure.   3. The mitral valve is normal in structure. Mild mitral valve  regurgitation. No evidence of mitral stenosis.   4. The aortic  valve is normal in structure. Aortic valve regurgitation is  mild. No aortic stenosis is present.   5. The inferior vena cava is normal in size with greater than 50%  respiratory variability, suggesting right atrial pressure of 3 mmHg.    11/25/2005: TTE SUMMARY   -  Overall left ventricular systolic function was mildly decreased.         Left ventricular ejection fraction was estimated , range         being 40 % to 45 %. Doppler parameters were consistent with         abnormal left ventricular relaxation; this left ventricular         filling pattern is also seen with aging. There was moderate         dyssynergic motion of the interventricular septum, consistent         with a conduction abnormality or paced rhythm.   -  Aortic valve thickness was mildly increased.     Recent Labs: 11/16/2023: ALT 15 11/22/2023: BUN 20; Creatinine, Ser 1.06; Hemoglobin 14.3; Platelets 215; Potassium 4.8; Sodium 143  03/31/2023: Cholesterol 132; HDL 42; LDL Cholesterol (Calc) 69; Total CHOL/HDL Ratio 3.1; Triglycerides 133   CrCl cannot be calculated (Patient's most recent lab result is older than the maximum 21 days allowed.).   Wt Readings from Last 3 Encounters:  02/15/24 184 lb (83.5 kg)  01/16/24 184 lb (83.5 kg)  12/09/23 180 lb (81.6 kg)     Other studies reviewed: Additional studies/records reviewed today include: summarized above  ASSESSMENT AND  PLAN:  PPM *** Intact function *** No programming changes made  HTN ***  HLD Labs monitored and managed with his PMD  4. CM Stable LVEF 2023 at 45% going back to 2007 No HF history On BB/ARB *** No changes made     Disposition: emotes as usual, ***, sooner if needed   Current medicines are reviewed at length with the patient today.  The patient did not have any concerns regarding medicines.  Bonney Charlies Arthur, PA-C 03/11/2024 9:50 AM     [facility] [address] [place] [address] [phone] (office)  [phone] (fax)

## 2024-03-12 ENCOUNTER — Ambulatory Visit (INDEPENDENT_AMBULATORY_CARE_PROVIDER_SITE_OTHER)

## 2024-03-12 ENCOUNTER — Ambulatory Visit: Attending: Physician Assistant | Admitting: Physician Assistant

## 2024-03-12 ENCOUNTER — Ambulatory Visit: Payer: Self-pay | Admitting: Cardiology

## 2024-03-12 ENCOUNTER — Ambulatory Visit

## 2024-03-12 VITALS — BP 134/52 | HR 70 | Ht 67.0 in | Wt 189.0 lb

## 2024-03-12 VITALS — BP 110/80 | HR 70 | Ht 67.0 in | Wt 186.0 lb

## 2024-03-12 DIAGNOSIS — I429 Cardiomyopathy, unspecified: Secondary | ICD-10-CM | POA: Diagnosis not present

## 2024-03-12 DIAGNOSIS — E538 Deficiency of other specified B group vitamins: Secondary | ICD-10-CM | POA: Diagnosis not present

## 2024-03-12 DIAGNOSIS — I442 Atrioventricular block, complete: Secondary | ICD-10-CM | POA: Diagnosis not present

## 2024-03-12 DIAGNOSIS — I10 Essential (primary) hypertension: Secondary | ICD-10-CM | POA: Diagnosis not present

## 2024-03-12 DIAGNOSIS — Z950 Presence of cardiac pacemaker: Secondary | ICD-10-CM

## 2024-03-12 LAB — CUP PACEART INCLINIC DEVICE CHECK
Date Time Interrogation Session: 20250908132247
Implantable Lead Connection Status: 753985
Implantable Lead Connection Status: 753985
Implantable Lead Implant Date: 20110517
Implantable Lead Implant Date: 20110517
Implantable Lead Location: 753859
Implantable Lead Location: 753860
Implantable Lead Model: 4469
Implantable Lead Model: 4470
Implantable Lead Serial Number: 543103
Implantable Lead Serial Number: 672341
Implantable Pulse Generator Implant Date: 20250606
Lead Channel Impedance Value: 576 Ohm
Lead Channel Impedance Value: 673 Ohm
Lead Channel Pacing Threshold Amplitude: 0.6 V
Lead Channel Pacing Threshold Amplitude: 0.7 V
Lead Channel Pacing Threshold Pulse Width: 0.4 ms
Lead Channel Pacing Threshold Pulse Width: 0.4 ms
Lead Channel Sensing Intrinsic Amplitude: 4.2 mV
Lead Channel Setting Pacing Amplitude: 1.2 V
Lead Channel Setting Pacing Amplitude: 2.5 V
Lead Channel Setting Pacing Pulse Width: 0.4 ms
Lead Channel Setting Sensing Sensitivity: 3.5 mV
Pulse Gen Serial Number: 142906
Zone Setting Status: 755011

## 2024-03-12 MED ORDER — CYANOCOBALAMIN 1000 MCG/ML IJ SOLN
1000.0000 ug | Freq: Once | INTRAMUSCULAR | Status: AC
  Administered 2024-03-12: 1000 ug via INTRAMUSCULAR

## 2024-03-12 NOTE — Patient Instructions (Signed)
 Medication Instructions:   Your physician recommends that you continue on your current medications as directed. Please refer to the Current Medication list given to you today.   *If you need a refill on your cardiac medications before your next appointment, please call your pharmacy*   Lab Work: NONE ORDERED  TODAY    If you have labs (blood work) drawn today and your tests are completely normal, you will receive your results only by: MyChart Message (if you have MyChart) OR A paper copy in the mail If you have any lab test that is abnormal or we need to change your treatment, we will call you to review the results.   Testing/Procedures: Your physician has requested that you have an echocardiogram. Echocardiography is a painless test that uses sound waves to create images of your heart. It provides your doctor with information about the size and shape of your heart and how well your heart's chambers and valves are working. This procedure takes approximately one hour. There are no restrictions for this procedure. Please do NOT wear cologne, perfume, aftershave, or lotions (deodorant is allowed). Please arrive 15 minutes prior to your appointment time.  Please note: We ask at that you not bring children with you during ultrasound (echo/ vascular) testing. Due to room size and safety concerns, children are not allowed in the ultrasound rooms during exams. Our front office staff cannot provide observation of children in our lobby area while testing is being conducted. An adult accompanying a patient to their appointment will only be allowed in the ultrasound room at the discretion of the ultrasound technician under special circumstances. We apologize for any inconvenience.    Follow-Up: At [facility], you and your health needs are our priority.  As part of our continuing mission to provide you with exceptional heart care, our providers are all part of one team.  This team includes  your primary Cardiologist (physician) and Advanced Practice Providers or APPs (Physician Assistants and Nurse Practitioners) who all work together to provide you with the care you need, when you need it.   Your next appointment:   6 month(s)   Provider:   Soyla Norton, MD  or [place]   We recommend signing up for the patient portal called MyChart.  Sign up information is provided on this After Visit Summary.  MyChart is used to connect with patients for Virtual Visits (Telemedicine).  Patients are able to view lab/test results, encounter notes, upcoming appointments, etc.  Non-urgent messages can be sent to your provider as well.   To learn more about what you can do with MyChart, go to [email or web address].   Other Instructions

## 2024-03-13 ENCOUNTER — Ambulatory Visit: Payer: Self-pay | Admitting: Cardiology

## 2024-03-13 LAB — CUP PACEART REMOTE DEVICE CHECK
Battery Remaining Longevity: 156 mo
Battery Remaining Percentage: 100 %
Brady Statistic RA Percent Paced: 8 %
Brady Statistic RV Percent Paced: 99 %
Date Time Interrogation Session: 20250908003100
Implantable Lead Connection Status: 753985
Implantable Lead Connection Status: 753985
Implantable Lead Implant Date: 20110517
Implantable Lead Implant Date: 20110517
Implantable Lead Location: 753859
Implantable Lead Location: 753860
Implantable Lead Model: 4469
Implantable Lead Model: 4470
Implantable Lead Serial Number: 543103
Implantable Lead Serial Number: 672341
Implantable Pulse Generator Implant Date: 20250606
Lead Channel Impedance Value: 561 Ohm
Lead Channel Impedance Value: 655 Ohm
Lead Channel Pacing Threshold Amplitude: 0.6 V
Lead Channel Pacing Threshold Pulse Width: 0.4 ms
Lead Channel Setting Pacing Amplitude: 1.2 V
Lead Channel Setting Pacing Amplitude: 2.5 V
Lead Channel Setting Pacing Pulse Width: 0.4 ms
Lead Channel Setting Sensing Sensitivity: 3.5 mV
Pulse Gen Serial Number: 142906
Zone Setting Status: 755011

## 2024-03-14 ENCOUNTER — Ambulatory Visit: Payer: Medicare Other

## 2024-03-22 NOTE — Progress Notes (Signed)
 Remote PPM Transmission

## 2024-03-25 ENCOUNTER — Other Ambulatory Visit: Payer: Self-pay | Admitting: Physician Assistant

## 2024-03-25 ENCOUNTER — Other Ambulatory Visit: Payer: Self-pay | Admitting: Internal Medicine

## 2024-04-03 ENCOUNTER — Other Ambulatory Visit: Payer: Medicare Other

## 2024-04-03 DIAGNOSIS — E538 Deficiency of other specified B group vitamins: Secondary | ICD-10-CM

## 2024-04-03 DIAGNOSIS — Z5181 Encounter for therapeutic drug level monitoring: Secondary | ICD-10-CM

## 2024-04-03 DIAGNOSIS — Z125 Encounter for screening for malignant neoplasm of prostate: Secondary | ICD-10-CM

## 2024-04-03 DIAGNOSIS — I10 Essential (primary) hypertension: Secondary | ICD-10-CM

## 2024-04-03 DIAGNOSIS — R413 Other amnesia: Secondary | ICD-10-CM

## 2024-04-03 DIAGNOSIS — Z0000 Encounter for general adult medical examination without abnormal findings: Secondary | ICD-10-CM

## 2024-04-05 ENCOUNTER — Other Ambulatory Visit

## 2024-04-05 DIAGNOSIS — Z0000 Encounter for general adult medical examination without abnormal findings: Secondary | ICD-10-CM

## 2024-04-05 DIAGNOSIS — I10 Essential (primary) hypertension: Secondary | ICD-10-CM

## 2024-04-05 DIAGNOSIS — R413 Other amnesia: Secondary | ICD-10-CM

## 2024-04-05 DIAGNOSIS — R7302 Impaired glucose tolerance (oral): Secondary | ICD-10-CM

## 2024-04-05 DIAGNOSIS — E538 Deficiency of other specified B group vitamins: Secondary | ICD-10-CM

## 2024-04-05 DIAGNOSIS — Z79899 Other long term (current) drug therapy: Secondary | ICD-10-CM

## 2024-04-05 NOTE — Progress Notes (Signed)
 Annual Wellness Visit   Patient Care Team: Baxley, Ronal PARAS, MD as PCP - General (Internal Medicine) Inocencio Soyla Lunger, MD as PCP - Electrophysiology (Cardiology) Margaret Eduard SAUNDERS, MD as Consulting Physician (Neurology) Janell Lyle RODES, RN as Registered Nurse  Visit Date: 04/06/24   Chief Complaint  Patient presents with   Annual Exam   Medicare Wellness   Subjective:  Patient: Robert Ramsey, Male DOB: [birth year], 83 y.o. MRN: [id number] Vitals:   04/06/24 1044  BP: 120/80   Robert Ramsey is a 83 y.o. Male who presents today for his Annual Wellness Visit. Patient has Complete heart block (HCC); Hypertension; CAD (coronary artery disease); Hyperlipidemia; Pacemaker-Medtronic; Hx of partial nephrectomy; Memory loss; Excessive daytime sleepiness; Weight loss; Nontraumatic complete tear of right rotator cuff; and Impingement syndrome of right shoulder on their problem list.  He says he's doing well.    History of Hyperlipidemia treated with Rosuvastatin  2.5 mg three times weekly. 04/05/2024 Lipid panel normal     History of Left Bundle branch block, Complete heart block. MDT dual champer PPM implanted 11/18/2009.   History of Hypertension treated with metoprolol  succinate 25 mg daily, Losartan  25 mg daily, Followed by [facility].  Blood pressure today is normal at 120/80.   History of B-12 deficiency treated with monthly B12 injections.    History of GE Reflux treated with Pepcid 20 mg twice daily. Says he's not having much reflux.   History of Depression treated with sertraline  25 mg daily.   Followed by [facility] for memory disturbance, obstructive sleep apnea, He is taking Aricept  at bedtime. Uses CPAP nightly.   Sees Dr. Rosalind at [facility]. Taking Myrbetriq and Flomax  for urinary frequency.    Had right shoulder decompression and debridement by Dr. Harden in February 2022.  He went to physical therapy and recovered well.   He has mild memory  loss.  He has not gotten lost while driving.   He has a number of drug intolerances.  Most of these are intolerances rather than true allergies.   He had neuropsychological testing by Dr. Zelson in 2016 and was diagnosed with mild cognitive impairment.   History of appendectomy.  Had cataract surgery in December 2010.  History of left bundle branch block.  He quit smoking in 1995.  History of impaired glucose tolerance and GE reflux.   He had partial right nephrectomy for renal mass by Dr. Ottelin in 2012.  History of pacemaker in place for Mobitz type II AV block.  Had cardiac catheterization 2007 showing mild diffuse coronary artery disease.   Is seeing Dr. Tobie for bilateral macular degeneration.    Followed by [facility] for BPH with lower  urinarytract symptoms.  Labs 04/05/2024 WNL   07/27/2023 Colonoscopy External and internal hemorrhoids, Diverticulosis in the sigmoid colon, Diverticulsis in the descending colon, in the transverse colon and in the ascending colon. One diminutive polyp in the distal transverse colon. Polypoid colonic mucosa, negative for dysplasia. The examination was otherwise normal.   Health Maintenance  Topic Date Due   DTaP/Tdap/Td (3 - Td or Tdap) 02/17/2022   Influenza Vaccine  02/03/2024   COVID-19 Vaccine (7 - Moderna risk 2024-25 season) 03/05/2024   Zoster Vaccines- Shingrix (1 of 2) 04/17/2024 (Originally 05/08/1960)   Medicare Annual Wellness (AWV)  04/06/2025   Pneumococcal Vaccine: 50+ Years  Completed   HPV VACCINES  Aged Out   Meningococcal B Vaccine  Aged Out      Review of  Systems  Constitutional:  Negative for fever and malaise/fatigue.  HENT:  Negative for congestion.   Eyes:  Negative for blurred vision.  Respiratory:  Negative for cough and shortness of breath.   Cardiovascular:  Negative for chest pain, palpitations and leg swelling.  Gastrointestinal:  Negative for vomiting.  Musculoskeletal:  Negative for back pain.   Skin:  Negative for rash.  Neurological:  Negative for loss of consciousness and headaches.   Objective:  Vitals: body mass index is 29.86 kg/m. Today's Vitals   04/06/24 1044  BP: 120/80  Pulse: 60  SpO2: 97%  Weight: 185 lb (83.9 kg)  Height: 5' 6 (1.676 m)  PainSc: 0-No pain   Physical Exam Vitals and nursing note reviewed. Exam conducted with a chaperone present (Araceli Vanndale, CMA).  Constitutional:      General: He is awake. He is not in acute distress.    Appearance: Normal appearance. He is not ill-appearing or toxic-appearing.  HENT:     Head: Normocephalic and atraumatic.     Right Ear: Tympanic membrane, ear canal and external ear normal.     Left Ear: Tympanic membrane, ear canal and external ear normal.     Ears:     Comments: Cerumen in right ear.     Mouth/Throat:     Pharynx: Oropharynx is clear.  Eyes:     Extraocular Movements: Extraocular movements intact.     Pupils: Pupils are equal, round, and reactive to light.  Neck:     Thyroid : No thyroid  mass, thyromegaly or thyroid  tenderness.     Vascular: No carotid bruit.  Cardiovascular:     Rate and Rhythm: Normal rate and regular rhythm. No extrasystoles are present.    Pulses:          Dorsalis pedis pulses are 2+ on the right side and 2+ on the left side.       Posterior tibial pulses are 2+ on the right side and 2+ on the left side.     Heart sounds: Normal heart sounds. No murmur heard.    No friction rub. No gallop.  Pulmonary:     Effort: Pulmonary effort is normal.     Breath sounds: Normal breath sounds. No decreased breath sounds, wheezing, rhonchi or rales.  Chest:     Chest wall: No mass.  Abdominal:     Palpations: Abdomen is soft. There is no hepatomegaly, splenomegaly or mass.     Tenderness: There is no abdominal tenderness.     Hernia: No hernia is present.  Genitourinary:    Prostate: Normal. Not enlarged, not tender and no nodules present.     Rectum: Normal. Guaiac result  negative.  Musculoskeletal:     Cervical back: Normal range of motion.     Right lower leg: 1+ Edema present.     Left lower leg: 1+ Edema present.  Lymphadenopathy:     Cervical: No cervical adenopathy.     Upper Body:     Right upper body: No supraclavicular adenopathy.     Left upper body: No supraclavicular adenopathy.  Skin:    General: Skin is warm and dry.  Neurological:     General: No focal deficit present.     Mental Status: He is alert and oriented to person, place, and time. Mental status is at baseline.     Cranial Nerves: Cranial nerves 2-12 are intact.     Sensory: Sensation is intact.     Motor: Motor function is intact.  Coordination: Coordination is intact.     Gait: Gait is intact.     Deep Tendon Reflexes: Reflexes are normal and symmetric.  Psychiatric:        Attention and Perception: Attention normal.        Mood and Affect: Mood normal.        Speech: Speech normal.        Behavior: Behavior normal. Behavior is cooperative.        Thought Content: Thought content normal.        Cognition and Memory: Cognition and memory normal.        Judgment: Judgment normal.     Current Outpatient Medications  Medication Instructions   aspirin 81 mg, Daily   donepezil  (ARICEPT ) 5 mg, Oral, Daily at bedtime   ezetimibe  (ZETIA ) 10 mg, Oral, Daily   famotidine (PEPCID) 20 mg, 2 times daily   losartan  (COZAAR ) 25 mg, Oral, Daily   metoprolol  succinate (TOPROL -XL) 25 mg, Oral, Daily   Multiple Vitamin (MULTIVITAMIN) tablet 1 tablet, Daily after supper   Multiple Vitamins-Minerals (PRESERVISION AREDS 2 PO) 1 tablet, 2 times daily   Myrbetriq 50 mg, Daily   rosuvastatin  (CRESTOR ) 5 MG tablet TAKE ONE-HALF TABLET BY MOUTH 3  TIMES WEEKLY   sertraline  (ZOLOFT ) 25 mg, Oral, Daily   Past Medical History:  Diagnosis Date   Abdominal pain    Abnormal stress test 09/15/2010   SEPTAL DEFECT SECONDARY TO PREVIOUS INFARCT VS LBBB; MILD SEPTAL ISCHEMIA   Acid reflux     Arthritis    CAD (coronary artery disease)    cath in 2007 reveals mild diffuse CAD   Colon polyps    Complete heart block (HCC) 11/2009   has PTVP in place   Diverticulosis    Glucose intolerance (impaired glucose tolerance)    Hemorrhoids    Hiatal hernia    History of colonoscopy    HTN (hypertension)    Hypercholesterolemia    Macular degeneration    left eye.   Memory impairment    better with lower dose statin   Pacemaker    Medtronic   PVC's (premature ventricular contractions)    Sinusitis    Sleep apnea    CPAP nightly   Ventral hernia    Medical/Surgical History Narrative:  Allergic/Intolerant to:  Allergies  Allergen Reactions   Cinnamon Other (See Comments)    Cinnamon toothpaste caused him to break out inside his mouth and lesions were removed   Flexeril  [Cyclobenzaprine  Hcl] Other (See Comments)    Possible heart side effects   Levofloxacin Other (See Comments)    Elevated heart rate   Nabumetone Other (See Comments)    Heart issues/hospital admission   Naproxen Other (See Comments)    Effects blood pressure and heart   Nsaids Other (See Comments)    Effects blood pressure and heart   Vantin Other (See Comments)    Pseudo colitis   Vioxx [Rofecoxib] Other (See Comments)    Effects blood pressure and heart   Acyclovir And Related     Unknown reaction   Imodium [Loperamide Hcl]     Irritates Stomach     Past Surgical History:  Procedure Laterality Date   APPENDECTOMY     CARDIAC CATHETERIZATION  07/29/2005   EF 50-55%; Has mild diffuse CAD   cataract surgery  06/2009, 10/2014   Dr. Rosan   COLONOSCOPY     CPAP     EYE SURGERY     INSERT /  REPLACE / REMOVE PACEMAKER  11/18/2009   w/ Dr. Tisa   LBBB     NOSE SURGERY     PACEMAKER INSERTION  11/18/09   MDT implanted by Dr Tisa   [facility] GENERATOR CHANGEOUT N/A 12/09/2023   Procedure: PPM GENERATOR CHANGEOUT;  Surgeon: Inocencio Soyla Lunger, MD;  Location: [facility] INVASIVE CV LAB;  Service:  Cardiovascular;  Laterality: N/A;   REFRACTIVE SURGERY Bilateral    right nephrectomy  01/2006   partial for mass   SHOULDER ARTHROSCOPY Right 08/22/2020   Procedure: RIGHT SHOULDER ARTHROSCOPY, DEBRIDEMENT, AND DECOMPRESSION;  Surgeon: Harden Jerona GAILS, MD;  Location: [facility] OR;  Service: Orthopedics;  Laterality: Right;   TONSILLECTOMY     Family History  Problem Relation Age of Onset   Leukemia Mother    Dementia Mother    Heart failure Father    Sleep apnea Neg Hx    Family History Narrative: Father deceased due to heart failure. Mother deceased due to leukemia.  Social History   Social History Narrative   Retired from Environmental health practitioner.   Lives at [facility] 10-04-2023.  Independent living.    Caffeine use- morning coffee and 2 cup/ 1 cup of tea at lunch   He is a widower. Resides alone. Family is supportive and checks in on him regularly. He continues to drive. His wife passed away in May 07, 2019.  Most Recent Health Risks Assessment:   Medicare Risk at Home - 04/06/24 1051     Any stairs in or around the home? No    If so, are there any without handrails? No    Home free of loose throw rugs in walkways, pet beds, electrical cords, etc? Yes    Adequate lighting in your home to reduce risk of falls? No    Life alert? No    Use of a cane, walker or w/c? No    Grab bars in the bathroom? No    Shower chair or bench in shower? Yes    Elevated toilet seat or a handicapped toilet? No         Most Recent Social Determinants of Health (Including Hx of Tobacco, Alcohol, and Drug Use) SDOH Screenings   Food Insecurity: No Food Insecurity (04/04/2023)  Housing: Low Risk  (04/04/2023)  Transportation Needs: No Transportation Needs (04/04/2023)  Utilities: Not At Risk (04/04/2023)  Alcohol Screen: Low Risk  (04/06/2024)  Depression (PHQ2-9): Low Risk  (04/06/2024)  Financial Resource Strain: Low Risk  (04/04/2023)  Physical Activity: Inactive (04/06/2024)  Social Connections: Moderately  Integrated (04/06/2024)  Stress: No Stress Concern Present (04/06/2024)  Tobacco Use: Medium Risk (04/06/2024)  Health Literacy: Adequate Health Literacy (04/06/2024)   Social History   Tobacco Use   Smoking status: Former    Current packs/day: 0.00    Types: Cigarettes    Quit date: 07/06/1983    Years since quitting: 40.7   Smokeless tobacco: Never   Tobacco comments:    stopped smoking in 05/06/84  Vaping Use   Vaping status: Never Used  Substance Use Topics   Alcohol use: No   Drug use: No   Most Recent Functional Status Assessment:    04/06/2024   10:49 AM  In your present state of health, do you have any difficulty performing the following activities:  Hearing? 0  Vision? 0  Difficulty concentrating or making decisions? 1  Walking or climbing stairs? 0  Dressing or bathing? 0  Doing errands, shopping? 1  Preparing Food and eating ? N  Using the Toilet? N  In the past six months, have you accidently leaked urine? N  Do you have problems with loss of bowel control? N  Managing your Finances? Y  Housekeeping or managing your Housekeeping? Y   Most Recent Fall Risk Assessment:    04/06/2024   10:45 AM  Fall Risk   Falls in the past year? 0  Number falls in past yr: 0  Injury with Fall? 0  Risk for fall due to : No Fall Risks  Follow up Falls prevention discussed;Education provided;Falls evaluation completed   Most Recent Anxiety/Depression Screenings:    04/06/2024   10:45 AM 04/04/2023   10:51 AM  PHQ 2/9 Scores  PHQ - 2 Score 0 0    Most Recent Cognitive Screening:    04/06/2024   10:52 AM  6CIT Screen  What Year? 0 points  What month? 3 points  What time? 0 points  Count back from 20 0 points  Months in reverse 2 points  Repeat phrase 6 points  Total Score 11 points    Results:  Studies Obtained And Personally Reviewed By Me:  07/27/2023 Colonoscopy External and internal hemorrhoids, Diverticulosis in the sigmoid colon, Diverticulsis in the  descending colon, in the transverse colon and in the ascending colon. One diminutive polyp in the distal transverse colon. Th examination was otherwise normal.   Labs:  CBC w/ Differential Lab Results  Component Value Date   WBC 5.9 04/05/2024   RBC 4.22 04/05/2024   HGB 13.5 04/05/2024   HCT 41.6 04/05/2024   PLT 240 04/05/2024   MCV 98.6 04/05/2024   MCH 32.0 04/05/2024   MCHC 32.5 04/05/2024   RDW 13.3 04/05/2024   MPV 10.1 04/05/2024   LYMPHSABS 1,037 03/31/2023   MONOABS 456 11/06/2015   BASOSABS 41 04/05/2024    Comprehensive Metabolic Panel Lab Results  Component Value Date   NA 142 04/05/2024   K 4.9 04/05/2024   CL 107 04/05/2024   CO2 27 04/05/2024   GLUCOSE 92 04/05/2024   BUN 23 04/05/2024   CREATININE 1.04 04/05/2024   CALCIUM  9.9 04/05/2024   PROT 6.6 04/05/2024   ALBUMIN 4.2 10/01/2019   AST 16 04/05/2024   ALT 16 04/05/2024   ALKPHOS 57 10/01/2019   BILITOT 0.5 04/05/2024   GFR 71.49 12/23/2014   EGFR 71 04/05/2024   GFRNONAA >60 08/20/2020   Lipid Panel  Lab Results  Component Value Date   CHOL 169 04/05/2024   HDL 50 04/05/2024   LDLCALC 96 04/05/2024   TRIG 135 04/05/2024   A1c Lab Results  Component Value Date   HGBA1C 5.6 04/05/2024    TSH Lab Results  Component Value Date   TSH 1.53 12/11/2018   PSA 04/06/2024 0.68 Assessment & Plan:   Hyperlipidemia: treated with Rosuvastatin  2.5 mg three times weekly. 04/05/2024 Lipid panel normal     Left Bundle branch block, Complete heart block: MDT dual champer PPM implanted 11/18/2009.   Hypertension: treated with Metoprolol  succinate 25 mg daily, Losartan  25 mg daily, Followed by [facility] cardiology.  Blood pressure today is normal at 120/80.   B-12 deficiency: treated with monthly B12 injections.    GE Reflux: treated with Pepcid 20 mg twice daily. Says he's not having much reflux.   Depression: treated with sertraline  25 mg daily.   Followed by [facility] for memory  disturbance, obstructive sleep apnea, He is taking Aricept  at bedtime. Uses CPAP nightly.   Sees Dr. Rosalind at [facility]. Taking Myrbetriq and Flomax  for urinary frequency.    Had right shoulder decompression and debridement by Dr. Harden in February 2022.  He went to physical therapy and recovered well.   He has mild memory loss.  He has not gotten lost while driving.   He had neuropsychological testing by Dr. Zelson in 2016 and was diagnosed with mild cognitive impairment.   He had partial right nephrectomy for renal mass by Dr. Ottelin in 2012.  History of pacemaker in place for Mobitz type II AV block.  Had cardiac catheterization 2007 showing mild diffuse coronary artery disease.   Is seeing Dr. Tobie for bilateral macular degeneration.    BPH with lower urinary tract symptoms seen at [facility].  07/27/2023 Colonoscopy External and internal hemorrhoids, Diverticulosis in the sigmoid colon, Diverticulsis in the descending colon, in the transverse colon and in the ascending colon. One diminutive polyp in the distal transverse colon. Polypoid colonic mucosa, negative for dysplasia. The examination was otherwise normal.   Plan: He will continue monthly B12 injections. He prefers to come to the office for those.Continue current medications.    Annual Wellness Visit done today including the all of the following: Reviewed patient's Family Medical History Reviewed patient's SDOH and reviewed tobacco, alcohol, and drug use.  Reviewed and updated list of patient's medical providers Assessment of cognitive impairment was done Assessed patient's functional ability Established a written schedule for health screening services Health Risk Assessent Completed and Reviewed  Discussed health benefits of physical activity, and encouraged him to engage in regular exercise appropriate for his age and condition.    I,Makayla C Reid,acting as a scribe for Ronal JINNY Hailstone, MD.,have  documented all relevant documentation on the behalf of Ronal JINNY Hailstone, MD,as directed by  Ronal JINNY Hailstone, MD while in the presence of Ronal JINNY Hailstone, MD.   I, Ronal JINNY Hailstone, MD, have reviewed all documentation for and agree with the above Annual Wellness Visit documentation.  Ronal JINNY Hailstone, MD Internal Medicine 04/06/2024

## 2024-04-06 ENCOUNTER — Encounter: Payer: Self-pay | Admitting: Internal Medicine

## 2024-04-06 ENCOUNTER — Ambulatory Visit: Payer: Medicare Other | Admitting: Internal Medicine

## 2024-04-06 VITALS — BP 120/80 | HR 60 | Ht 66.0 in | Wt 185.0 lb

## 2024-04-06 DIAGNOSIS — E538 Deficiency of other specified B group vitamins: Secondary | ICD-10-CM

## 2024-04-06 DIAGNOSIS — K219 Gastro-esophageal reflux disease without esophagitis: Secondary | ICD-10-CM

## 2024-04-06 DIAGNOSIS — R413 Other amnesia: Secondary | ICD-10-CM

## 2024-04-06 DIAGNOSIS — I10 Essential (primary) hypertension: Secondary | ICD-10-CM | POA: Diagnosis not present

## 2024-04-06 DIAGNOSIS — F09 Unspecified mental disorder due to known physiological condition: Secondary | ICD-10-CM | POA: Diagnosis not present

## 2024-04-06 DIAGNOSIS — Z950 Presence of cardiac pacemaker: Secondary | ICD-10-CM | POA: Diagnosis not present

## 2024-04-06 DIAGNOSIS — N401 Enlarged prostate with lower urinary tract symptoms: Secondary | ICD-10-CM

## 2024-04-06 DIAGNOSIS — Z0000 Encounter for general adult medical examination without abnormal findings: Secondary | ICD-10-CM

## 2024-04-06 DIAGNOSIS — Z1211 Encounter for screening for malignant neoplasm of colon: Secondary | ICD-10-CM | POA: Diagnosis not present

## 2024-04-06 DIAGNOSIS — N138 Other obstructive and reflux uropathy: Secondary | ICD-10-CM

## 2024-04-06 LAB — CBC WITH DIFFERENTIAL/PLATELET
Absolute Lymphocytes: 1369 {cells}/uL (ref 850–3900)
Absolute Monocytes: 484 {cells}/uL (ref 200–950)
Basophils Absolute: 41 {cells}/uL (ref 0–200)
Basophils Relative: 0.7 %
Eosinophils Absolute: 277 {cells}/uL (ref 15–500)
Eosinophils Relative: 4.7 %
HCT: 41.6 % (ref 38.5–50.0)
Hemoglobin: 13.5 g/dL (ref 13.2–17.1)
MCH: 32 pg (ref 27.0–33.0)
MCHC: 32.5 g/dL (ref 32.0–36.0)
MCV: 98.6 fL (ref 80.0–100.0)
MPV: 10.1 fL (ref 7.5–12.5)
Monocytes Relative: 8.2 %
Neutro Abs: 3729 {cells}/uL (ref 1500–7800)
Neutrophils Relative %: 63.2 %
Platelets: 240 Thousand/uL (ref 140–400)
RBC: 4.22 Million/uL (ref 4.20–5.80)
RDW: 13.3 % (ref 11.0–15.0)
Total Lymphocyte: 23.2 %
WBC: 5.9 Thousand/uL (ref 3.8–10.8)

## 2024-04-06 LAB — HEMOCCULT GUIAC POC 1CARD (OFFICE): Fecal Occult Blood, POC: NEGATIVE

## 2024-04-06 LAB — COMPREHENSIVE METABOLIC PANEL WITH GFR
AG Ratio: 1.9 (calc) (ref 1.0–2.5)
ALT: 16 U/L (ref 9–46)
AST: 16 U/L (ref 10–35)
Albumin: 4.3 g/dL (ref 3.6–5.1)
Alkaline phosphatase (APISO): 56 U/L (ref 35–144)
BUN: 23 mg/dL (ref 7–25)
CO2: 27 mmol/L (ref 20–32)
Calcium: 9.9 mg/dL (ref 8.6–10.3)
Chloride: 107 mmol/L (ref 98–110)
Creat: 1.04 mg/dL (ref 0.70–1.22)
Globulin: 2.3 g/dL (ref 1.9–3.7)
Glucose, Bld: 92 mg/dL (ref 65–99)
Potassium: 4.9 mmol/L (ref 3.5–5.3)
Sodium: 142 mmol/L (ref 135–146)
Total Bilirubin: 0.5 mg/dL (ref 0.2–1.2)
Total Protein: 6.6 g/dL (ref 6.1–8.1)
eGFR: 71 mL/min/1.73m2 (ref >=60)

## 2024-04-06 LAB — LIPID PANEL
Cholesterol: 169 mg/dL (ref <200)
HDL: 50 mg/dL (ref >=40)
LDL Cholesterol (Calc): 96 mg/dL
Non-HDL Cholesterol (Calc): 119 mg/dL (ref <130)
Total CHOL/HDL Ratio: 3.4 (calc) (ref <5.0)
Triglycerides: 135 mg/dL (ref <150)

## 2024-04-06 LAB — HEMOGLOBIN A1C
Hgb A1c MFr Bld: 5.6 % (ref <5.7)
Mean Plasma Glucose: 114 mg/dL
eAG (mmol/L): 6.3 mmol/L

## 2024-04-06 LAB — PSA: PSA: 0.68 ng/mL (ref <=4.00)

## 2024-04-06 MED ORDER — CYANOCOBALAMIN 1000 MCG/ML IJ SOLN
1000.0000 ug | Freq: Once | INTRAMUSCULAR | Status: AC
  Administered 2024-04-06: 1000 ug via INTRAMUSCULAR

## 2024-04-06 NOTE — Patient Instructions (Signed)
Next appointment: Follow up in one year for your annual wellness visit.  Preventive Care 2 Years and Older, Male Preventive care refers to lifestyle choices and visits with your health care provider that can promote health and wellness. What does preventive care include? A yearly physical exam. This is also called an annual well check. Dental exams once or twice a year. Routine eye exams. Ask your health care provider how often you should have your eyes checked. Personal lifestyle choices, including: Daily care of your teeth and gums. Regular physical activity. Eating a healthy diet. Avoiding tobacco and drug use. Limiting alcohol use. Practicing safe sex. Taking low doses of aspirin every day. Taking vitamin and mineral supplements as recommended by your health care provider. What happens during an annual well check? The services and screenings done by your health care provider during your annual well check will depend on your age, overall health, lifestyle risk factors, and family history of disease. Counseling  Your health care provider may ask you questions about your: Alcohol use. Tobacco use. Drug use. Emotional well-being. Home and relationship well-being. Sexual activity. Eating habits. History of falls. Memory and ability to understand (cognition). Work and work Astronomer. Screening  You may have the following tests or measurements: Height, weight, and BMI. Blood pressure. Lipid and cholesterol levels. These may be checked every 5 years, or more frequently if you are over 48 years old. Skin check. Lung cancer screening. You may have this screening every year starting at age 16 if you have a 30-pack-year history of smoking and currently smoke or have quit within the past 15 years. Fecal occult blood test (FOBT) of the stool. You may have this test every year starting at age 13. Flexible sigmoidoscopy or colonoscopy. You may have a sigmoidoscopy every 5 years or a  colonoscopy every 10 years starting at age 54. Prostate cancer screening. Recommendations will vary depending on your family history and other risks. Hepatitis C blood test. Hepatitis B blood test. Sexually transmitted disease (STD) testing. Diabetes screening. This is done by checking your blood sugar (glucose) after you have not eaten for a while (fasting). You may have this done every 1-3 years. Abdominal aortic aneurysm (AAA) screening. You may need this if you are a current or former smoker. Osteoporosis. You may be screened starting at age 64 if you are at high risk. Talk with your health care provider about your test results, treatment options, and if necessary, the need for more tests. Vaccines  Your health care provider may recommend certain vaccines, such as: Influenza vaccine. This is recommended every year. Tetanus, diphtheria, and acellular pertussis (Tdap, Td) vaccine. You may need a Td booster every 10 years. Zoster vaccine. You may need this after age 10. Pneumococcal 13-valent conjugate (PCV13) vaccine. One dose is recommended after age 63. Pneumococcal polysaccharide (PPSV23) vaccine. One dose is recommended after age 65. Talk to your health care provider about which screenings and vaccines you need and how often you need them. This information is not intended to replace advice given to you by your health care provider. Make sure you discuss any questions you have with your health care provider. Document Released: 07/18/2015 Document Revised: 03/10/2016 Document Reviewed: 04/22/2015 Elsevier Interactive Patient Education  2017 [employer].  Fall Prevention in the Home Falls can cause injuries. They can happen to people of all ages. There are many things you can do to make your home safe and to help prevent falls. What can I do on  the outside of my home? Regularly fix the edges of walkways and driveways and fix any cracks. Remove anything that might make you trip as you  walk through a door, such as a raised step or threshold. Trim any bushes or trees on the path to your home. Use bright outdoor lighting. Clear any walking paths of anything that might make someone trip, such as rocks or tools. Regularly check to see if handrails are loose or broken. Make sure that both sides of any steps have handrails. Any raised decks and porches should have guardrails on the edges. Have any leaves, snow, or ice cleared regularly. Use sand or salt on walking paths during winter. Clean up any spills in your garage right away. This includes oil or grease spills. What can I do in the bathroom? Use night lights. Install grab bars by the toilet and in the tub and shower. Do not use towel bars as grab bars. Use non-skid mats or decals in the tub or shower. If you need to sit down in the shower, use a plastic, non-slip stool. Keep the floor dry. Clean up any water that spills on the floor as soon as it happens. Remove soap buildup in the tub or shower regularly. Attach bath mats securely with double-sided non-slip rug tape. Do not have throw rugs and other things on the floor that can make you trip. What can I do in the bedroom? Use night lights. Make sure that you have a light by your bed that is easy to reach. Do not use any sheets or blankets that are too big for your bed. They should not hang down onto the floor. Have a firm chair that has side arms. You can use this for support while you get dressed. Do not have throw rugs and other things on the floor that can make you trip. What can I do in the kitchen? Clean up any spills right away. Avoid walking on wet floors. Keep items that you use a lot in easy-to-reach places. If you need to reach something above you, use a strong step stool that has a grab bar. Keep electrical cords out of the way. Do not use floor polish or wax that makes floors slippery. If you must use wax, use non-skid floor wax. Do not have throw rugs  and other things on the floor that can make you trip. What can I do with my stairs? Do not leave any items on the stairs. Make sure that there are handrails on both sides of the stairs and use them. Fix handrails that are broken or loose. Make sure that handrails are as long as the stairways. Check any carpeting to make sure that it is firmly attached to the stairs. Fix any carpet that is loose or worn. Avoid having throw rugs at the top or bottom of the stairs. If you do have throw rugs, attach them to the floor with carpet tape. Make sure that you have a light switch at the top of the stairs and the bottom of the stairs. If you do not have them, ask someone to add them for you. What else can I do to help prevent falls? Wear shoes that: Do not have high heels. Have rubber bottoms. Are comfortable and fit you well. Are closed at the toe. Do not wear sandals. If you use a stepladder: Make sure that it is fully opened. Do not climb a closed stepladder. Make sure that both sides of the stepladder are locked  into place. Ask someone to hold it for you, if possible. Clearly mark and make sure that you can see: Any grab bars or handrails. First and last steps. Where the edge of each step is. Use tools that help you move around (mobility aids) if they are needed. These include: Canes. Walkers. Scooters. Crutches. Turn on the lights when you go into a dark area. Replace any light bulbs as soon as they burn out. Set up your furniture so you have a clear path. Avoid moving your furniture around. If any of your floors are uneven, fix them. If there are any pets around you, be aware of where they are. Review your medicines with your doctor. Some medicines can make you feel dizzy. This can increase your chance of falling. Ask your doctor what other things that you can do to help prevent falls. This information is not intended to replace advice given to you by your health care provider. Make sure  you discuss any questions you have with your health care provider. Document Released: 04/17/2009 Document Revised: 11/27/2015 Document Reviewed: 07/26/2014 Elsevier Interactive Patient Education  2017 [employer].

## 2024-04-06 NOTE — Progress Notes (Signed)
 Subjective:   Robert Ramsey is a 83 y.o. male who presents for Medicare Annual/Subsequent preventive examination.  Visit Complete: In person  Patient Medicare AWV questionnaire was completed by the patient on 04/06/2024; I have confirmed that all information answered by patient is correct and no changes since this date.  Cardiac Risk Factors include: advanced age (>98men, >60 women);dyslipidemia;hypertension;male gender     Objective:    Today's Vitals   04/06/24 1044  BP: 120/80  Pulse: 60  SpO2: 97%  Weight: 185 lb (83.9 kg)  Height: 5' 6 (1.676 m)  PainSc: 0-No pain   Body mass index is 29.86 kg/m.     04/06/2024   10:52 AM 12/09/2023   12:54 PM 04/04/2023   10:54 AM 04/01/2022   10:56 AM 08/20/2020   11:26 AM 05/13/2020   10:15 AM 04/21/2015    1:00 PM  Advanced Directives  Does Patient Have a Medical Advance Directive? Yes Yes Yes Yes Yes Yes Yes   Type of Estate agent of [place];Living will Healthcare Power of [place];Living will Living will;Healthcare Power of [employer] Power of [place];Living will Healthcare Power of [place];Living will Healthcare Power of [place];Living will   Does patient want to make changes to medical advance directive?    No - Patient declined     Copy of Healthcare Power of Attorney in Chart? Yes - validated most recent copy scanned in chart (See row information)  No - copy requested No - copy requested Yes - validated most recent copy scanned in chart (See row information)  No - copy requested      Data saved with a previous flowsheet row definition    Current Medications (verified) Outpatient Encounter Medications as of 04/06/2024  Medication Sig   aspirin 81 MG tablet Take 81 mg by mouth daily.   donepezil  (ARICEPT ) 5 MG tablet Take 1 tablet (5 mg total) by mouth at bedtime.   ezetimibe  (ZETIA ) 10 MG tablet TAKE 1 TABLET BY MOUTH DAILY   famotidine (PEPCID) 20 MG tablet Take 20 mg by mouth 2 (two)  times daily.   losartan  (COZAAR ) 25 MG tablet TAKE 1 TABLET  BY MOUTH DAILY.   metoprolol  succinate (TOPROL -XL) 25 MG 24 hr tablet TAKE 1 TABLET BY MOUTH DAILY   Multiple Vitamin (MULTIVITAMIN) tablet Take 1 tablet by mouth daily after supper.   Multiple Vitamins-Minerals (PRESERVISION AREDS 2 PO) Take 1 tablet by mouth 2 (two) times daily.   MYRBETRIQ 50 MG TB24 tablet Take 50 mg by mouth daily.   rosuvastatin  (CRESTOR ) 5 MG tablet TAKE ONE-HALF TABLET BY MOUTH 3  TIMES WEEKLY   sertraline  (ZOLOFT ) 25 MG tablet TAKE 1 TABLET BY MOUTH DAILY   Facility-Administered Encounter Medications as of 04/06/2024  Medication   cyanocobalamin  (VITAMIN B12) injection 1,000 mcg   [COMPLETED] cyanocobalamin  (VITAMIN B12) injection 1,000 mcg    Allergies (verified) Cinnamon, Flexeril  [cyclobenzaprine  hcl], Levofloxacin, Nabumetone, Naproxen, Nsaids, Vantin, Vioxx [rofecoxib], Acyclovir and related, and Imodium [loperamide hcl]   History: Past Medical History:  Diagnosis Date   Abdominal pain    Abnormal stress test 09/15/2010   SEPTAL DEFECT SECONDARY TO PREVIOUS INFARCT VS LBBB; MILD SEPTAL ISCHEMIA   Acid reflux    Arthritis    CAD (coronary artery disease)    cath in 2007 reveals mild diffuse CAD   Colon polyps    Complete heart block (HCC) 11/2009   has PTVP in place   Diverticulosis    Glucose intolerance (impaired glucose  tolerance)    Hemorrhoids    Hiatal hernia    History of colonoscopy    HTN (hypertension)    Hypercholesterolemia    Macular degeneration    left eye.   Memory impairment    better with lower dose statin   Pacemaker    Medtronic   PVC's (premature ventricular contractions)    Sinusitis    Sleep apnea    CPAP nightly   Ventral hernia    Past Surgical History:  Procedure Laterality Date   APPENDECTOMY     CARDIAC CATHETERIZATION  07/29/2005   EF 50-55%; Has mild diffuse CAD   cataract surgery  06/2009, 10/2014   Dr. Rosan   COLONOSCOPY     CPAP      EYE SURGERY     INSERT / REPLACE / REMOVE PACEMAKER  11/18/2009   w/ Dr. Tisa   LBBB     NOSE SURGERY     PACEMAKER INSERTION  11/18/09   MDT implanted by Dr Tisa   [facility] GENERATOR CHANGEOUT N/A 12/09/2023   Procedure: PPM GENERATOR CHANGEOUT;  Surgeon: Inocencio Soyla Lunger, MD;  Location: [facility] INVASIVE CV LAB;  Service: Cardiovascular;  Laterality: N/A;   REFRACTIVE SURGERY Bilateral    right nephrectomy  01/2006   partial for mass   SHOULDER ARTHROSCOPY Right 08/22/2020   Procedure: RIGHT SHOULDER ARTHROSCOPY, DEBRIDEMENT, AND DECOMPRESSION;  Surgeon: Harden Jerona GAILS, MD;  Location: [facility] OR;  Service: Orthopedics;  Laterality: Right;   TONSILLECTOMY     Family History  Problem Relation Age of Onset   Leukemia Mother    Dementia Mother    Heart failure Father    Sleep apnea Neg Hx    Social History   Socioeconomic History   Marital status: Married    Spouse name: Cathlean   Number of children: 4   Years of education: 12   Highest education level: Not on file  Occupational History   Occupation: Retired    Comment: [employer]  Tobacco Use   Smoking status: Former    Current packs/day: 0.00    Types: Cigarettes    Quit date: 07/06/1983    Years since quitting: 40.7   Smokeless tobacco: Never   Tobacco comments:    stopped smoking in 1985  Vaping Use   Vaping status: Never Used  Substance and Sexual Activity   Alcohol use: No   Drug use: No   Sexual activity: Not Currently  Other Topics Concern   Not on file  Social History Narrative   Retired from Environmental health practitioner.   Lives at [facility] 10-04-2023.  Independent living.    Caffeine use- morning coffee and 2 cup/ 1 cup of tea at lunch    Social Drivers of Health   Financial Resource Strain: Low Risk  (04/04/2023)   Overall Financial Resource Strain (CARDIA)    Difficulty of Paying Living Expenses: Not very hard  Food Insecurity: No Food Insecurity (04/04/2023)   Hunger Vital Sign    Worried About Running Out of Food  in the Last Year: Never true    Ran Out of Food in the Last Year: Never true  Transportation Needs: No Transportation Needs (04/04/2023)   PRAPARE - Administrator, Civil Service (Medical): No    Lack of Transportation (Non-Medical): No  Physical Activity: Inactive (04/06/2024)   Exercise Vital Sign    Days of Exercise per Week: 0 days    Minutes of Exercise per Session: 0 min  Stress: No Stress Concern Present (04/06/2024)   [employer] of Occupational Health - Occupational Stress Questionnaire    Feeling of Stress: Not at all  Social Connections: Moderately Integrated (04/06/2024)   Social Connection and Isolation Panel    Frequency of Communication with Friends and Family: More than three times a week    Frequency of Social Gatherings with Friends and Family: More than three times a week    Attends Religious Services: More than 4 times per year    Active Member of [employer] or Organizations: Yes    Attends Banker Meetings: More than 4 times per year    Marital Status: Widowed    Tobacco Counseling Counseling given: No Tobacco comments: stopped smoking in 1985   Clinical Intake:  Pre-visit preparation completed: Yes  Pain : No/denies pain Pain Score: 0-No pain     BMI - recorded: 29.86 Nutritional Status: BMI 25 -29 Overweight Nutritional Risks: None Diabetes: No  How often do you need to have someone help you when you read instructions, pamphlets, or other written materials from your doctor or pharmacy?: 3 - Sometimes  Interpreter Needed?: No  Information entered by :: Kathlynn Porto, CMA   Activities of Daily Living    04/06/2024   10:49 AM  In your present state of health, do you have any difficulty performing the following activities:  Hearing? 0  Vision? 0  Difficulty concentrating or making decisions? 1  Walking or climbing stairs? 0  Dressing or bathing? 0  Doing errands, shopping? 1  Preparing Food and eating ? N  Using  the Toilet? N  In the past six months, have you accidently leaked urine? N  Do you have problems with loss of bowel control? N  Managing your Finances? Y  Housekeeping or managing your Housekeeping? Y    Patient Care Team: Perri Ronal PARAS, MD as PCP - General (Internal Medicine) Inocencio Soyla Lunger, MD as PCP - Electrophysiology (Cardiology) Margaret Eduard SAUNDERS, MD as Consulting Physician (Neurology) Acord, Lyle RODES, RN as Registered Nurse  Indicate any recent Medical Services you may have received from other than [facility] providers in the past year (date may be approximate).     Assessment:   This is a routine wellness examination for Brahm.  Hearing/Vision screen No results found.   Goals Addressed   None    Depression Screen    04/06/2024   10:45 AM 04/04/2023   10:51 AM 04/01/2022   10:56 AM 03/30/2021   11:06 AM 03/28/2020   10:03 AM 03/26/2019   10:05 AM 03/24/2018   11:02 AM  PHQ 2/9 Scores  PHQ - 2 Score 0 0 0 0 0 0 0    Fall Risk    04/06/2024   10:45 AM 04/04/2023   10:51 AM 04/01/2022   10:56 AM 03/30/2021   11:06 AM 03/28/2020   10:03 AM  Fall Risk   Falls in the past year? 0 0 0 0 0  Number falls in past yr: 0 0 0 0 0  Injury with Fall? 0 0 0 0 0  Risk for fall due to : No Fall Risks  No Fall Risks No Fall Risks   Follow up Falls prevention discussed;Education provided;Falls evaluation completed Falls evaluation completed Falls evaluation completed  Falls evaluation completed  Falls evaluation completed      Data saved with a previous flowsheet row definition    MEDICARE RISK AT HOME: Medicare Risk at Home Any stairs in or around  the home?: No If so, are there any without handrails?: No Home free of loose throw rugs in walkways, pet beds, electrical cords, etc?: Yes Adequate lighting in your home to reduce risk of falls?: No Life alert?: No Use of a cane, walker or w/c?: No Grab bars in the bathroom?: No Shower chair or bench in shower?: Yes Elevated  toilet seat or a handicapped toilet?: No  TIMED UP AND GO:  Was the test performed?  No    Cognitive Function:    10/26/2023   11:26 AM 10/20/2022   10:42 AM 04/05/2022   10:37 AM 10/01/2021    8:40 AM 10/01/2020    9:05 AM  MMSE - Mini Mental State Exam  Orientation to time 2 3 3 3 4   Orientation to Place 4 5 4 4 5   Registration 3 3 3 3 3   Attention/ Calculation 3 3 2 2 4   Recall 0 0 0 0 1  Language- name 2 objects 2 2 2 2 2   Language- repeat 0 0 0 1 1  Language- follow 3 step command 3 3 3 3 3   Language- read & follow direction 1 1 1 1 1   Write a sentence 1 0 1 1 1   Copy design 1 1 0 1 1  Total score 20 21 19 21 26         04/06/2024   10:52 AM 04/04/2023   10:54 AM 04/01/2022   10:58 AM 03/30/2021   11:06 AM  6CIT Screen  What Year? 0 points 4 points 0 points 0 points  What month? 3 points 0 points 0 points 0 points  What time? 0 points 0 points 0 points 0 points  Count back from 20 0 points 0 points 0 points 0 points  Months in reverse 2 points 4 points 4 points 0 points  Repeat phrase 6 points  10 points 0 points  Total Score 11 points  14 points 0 points    Immunizations Immunization History  Administered Date(s) Administered   Influenza Split 04/09/2011, 04/04/2012   Influenza,inj,Quad PF,6+ Mos 04/25/2013, 04/24/2015, 03/25/2016, 03/25/2017, 03/24/2018, 03/26/2019, 03/28/2020, 03/30/2021, 04/01/2022   Influenza-Unspecified 04/18/2014, 05/05/2023   Moderna Sars-Covid-2 Vaccination 08/14/2019, 09/14/2019   Pneumococcal Conjugate-13 10/25/2014   Pneumococcal Polysaccharide-23 08/08/2006   Td 03/22/1997   Tdap 02/18/2012    TDAP status: Due, Education has been provided regarding the importance of this vaccine. Advised may receive this vaccine at local pharmacy or Health Dept. Aware to provide a copy of the vaccination record if obtained from local pharmacy or Health Dept. Verbalized acceptance and understanding.  Flu Vaccine status: Due, Education has been  provided regarding the importance of this vaccine. Advised may receive this vaccine at local pharmacy or Health Dept. Aware to provide a copy of the vaccination record if obtained from local pharmacy or Health Dept. Verbalized acceptance and understanding.  Pneumococcal vaccine status: Up to date  Covid-19 vaccine status: Information provided on how to obtain vaccines.   Qualifies for Shingles Vaccine? Yes   Zostavax completed No   Shingrix Completed?: No.    Education has been provided regarding the importance of this vaccine. Patient has been advised to call insurance company to determine out of pocket expense if they have not yet received this vaccine. Advised may also receive vaccine at local pharmacy or Health Dept. Verbalized acceptance and understanding.  Screening Tests Health Maintenance  Topic Date Due   DTaP/Tdap/Td (3 - Td or Tdap) 02/17/2022   Influenza Vaccine  02/03/2024   COVID-19 Vaccine (7 - Moderna risk 2024-25 season) 03/05/2024   Zoster Vaccines- Shingrix (1 of 2) 04/17/2024 (Originally 05/08/1960)   Medicare Annual Wellness (AWV)  04/06/2025   Pneumococcal Vaccine: 50+ Years  Completed   HPV VACCINES  Aged Out   Meningococcal B Vaccine  Aged Out    Health Maintenance  Health Maintenance Due  Topic Date Due   DTaP/Tdap/Td (3 - Td or Tdap) 02/17/2022   Influenza Vaccine  02/03/2024   COVID-19 Vaccine (7 - Moderna risk 2024-25 season) 03/05/2024    Colorectal cancer screening: No longer required.   Lung Cancer Screening: (Low Dose CT Chest recommended if Age 46-80 years, 20 pack-year currently smoking OR have quit w/in 15years.) does not qualify.   Lung Cancer Screening Referral:   Additional Screening:  Hepatitis C Screening: does not qualify; Completed   Vision Screening: Recommended annual ophthalmology exams for early detection of glaucoma and other disorders of the eye. Is the patient up to date with their annual eye exam?  Yes  Who is the provider  or what is the name of the office in which the patient attends annual eye exams? [facility] If pt is not established with a provider, would they like to be referred to a provider to establish care? No .   Dental Screening: Recommended annual dental exams for proper oral hygiene  Community Resource Referral / Chronic Care Management: CRR required this visit?  No   CCM required this visit?  No     Plan:     I have personally reviewed and noted the following in the patient's chart:   Medical and social history Use of alcohol, tobacco or illicit drugs  Current medications and supplements including opioid prescriptions. Patient is not currently taking opioid prescriptions. Functional ability and status Nutritional status Physical activity Advanced directives List of other physicians Hospitalizations, surgeries, and ER visits in previous 12 months Vitals Screenings to include cognitive, depression, and falls Referrals and appointments  In addition, I have reviewed and discussed with patient certain preventive protocols, quality metrics, and best practice recommendations. A written personalized care plan for preventive services as well as general preventive health recommendations were provided to patient.     Yancy Knoble Zelda, CMA   04/06/2024   After Visit Summary: (In Person-Printed) AVS printed and given to the patient

## 2024-04-11 ENCOUNTER — Ambulatory Visit: Payer: Medicare Other

## 2024-04-15 ENCOUNTER — Encounter: Payer: Self-pay | Admitting: Internal Medicine

## 2024-04-19 ENCOUNTER — Ambulatory Visit (HOSPITAL_COMMUNITY)
Admission: RE | Admit: 2024-04-19 | Discharge: 2024-04-19 | Disposition: A | Discharge status: Home / Self Care | Source: Ambulatory Visit | Attending: Cardiology | Admitting: Cardiology

## 2024-04-19 DIAGNOSIS — I361 Nonrheumatic tricuspid (valve) insufficiency: Secondary | ICD-10-CM | POA: Diagnosis not present

## 2024-04-19 DIAGNOSIS — I351 Nonrheumatic aortic (valve) insufficiency: Secondary | ICD-10-CM

## 2024-04-19 DIAGNOSIS — I429 Cardiomyopathy, unspecified: Secondary | ICD-10-CM

## 2024-04-19 LAB — ECHOCARDIOGRAM COMPLETE
AV Mean grad: 6.8 mmHg
AV Peak grad: 12.4 mmHg
Ao pk vel: 1.76 m/s
Area-P 1/2: 2.66 cm2
P 1/2 time: 542 ms
S' Lateral: 2.4 cm

## 2024-04-23 ENCOUNTER — Other Ambulatory Visit: Payer: Self-pay | Admitting: Physician Assistant

## 2024-05-16 ENCOUNTER — Ambulatory Visit: Payer: Medicare Other

## 2024-05-29 ENCOUNTER — Other Ambulatory Visit: Payer: Self-pay | Admitting: Internal Medicine

## 2024-05-29 MED ORDER — DONEPEZIL HCL 5 MG PO TABS: 5.0000 mg | ORAL_TABLET | Freq: Every day | ORAL | 2 refills | Status: DC

## 2024-05-29 NOTE — Telephone Encounter (Signed)
 Copied from CRM [id number]. Topic: Clinical - Medication Refill >> May 29, 2024 10:55 AM Tysheama G wrote: Medication: donepezil  (ARICEPT ) 5 MG tablet  Has the patient contacted their pharmacy? No (Agent: If no, request that the patient contact the pharmacy for the refill. If patient does not wish to contact the pharmacy document the reason why and proceed with request.) (Agent: If yes, when and what did the pharmacy advise?)  This is the patient's preferred pharmacy:  [id number] - [place], [place] - [address] AT [address] [place] [address] Phone: [phone] Fax: [phone]    Is this the correct pharmacy for this prescription? Yes If no, delete pharmacy and type the correct one.   Has the prescription been filled recently? No  Is the patient out of the medication? Yes  Has the patient been seen for an appointment in the last year OR does the patient have an upcoming appointment? Yes  Can we respond through MyChart? Yes  Agent: Please be advised that Rx refills may take up to 3 business days. We ask that you follow-up with your pharmacy.

## 2024-06-04 ENCOUNTER — Telehealth: Payer: Self-pay | Admitting: Adult Health

## 2024-06-04 ENCOUNTER — Ambulatory Visit: Admitting: Adult Health

## 2024-06-04 NOTE — Telephone Encounter (Signed)
 Appointment details confirmed With daughter for 12/3 at 11:00 she was made aware of 10:30 arrival time

## 2024-06-06 ENCOUNTER — Encounter: Payer: Self-pay | Admitting: Adult Health

## 2024-06-06 ENCOUNTER — Ambulatory Visit: Admitting: Adult Health

## 2024-06-06 VITALS — BP 136/61 | HR 65 | Ht 66.0 in | Wt 188.0 lb

## 2024-06-06 DIAGNOSIS — G4733 Obstructive sleep apnea (adult) (pediatric): Secondary | ICD-10-CM

## 2024-06-06 DIAGNOSIS — R413 Other amnesia: Secondary | ICD-10-CM

## 2024-06-06 NOTE — Progress Notes (Signed)
 PATIENT: Robert Ramsey DOB: 1941/01/12  REASON FOR VISIT: follow up HISTORY FROM: patient  Chief Complaint  Patient presents with   Follow-up    Pt in 4 with daughter Pt here for  cpap and memory f/u Daughter and patient states memory the same      HISTORY OF PRESENT ILLNESS: Today 06/06/24:  Robert Ramsey is a 83 y.o. male with a history of OSA on CPAP and memory disturbance. Returns today for follow-up.  He did not bring his CPAP with him today.  He does state that he has been using it nightly.  Denies any issues with his CPAP machine.  He continues to live at [facility].  Able to complete all ADLs independently.  Manages own appointments finances and medications.  No change in his mood or behavior.  Remains on Aricept  at bedtime.  His daughter is with him today.  She has not noticed any changes in his memory.  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  She returns today for an evaluation.   10/26/23: Robert Ramsey is a 83 y.o. male with a history of OSA on CPAP and Memory disturbance. Returns today for follow-up. He is now at [facility].  Reports that he does enjoy there.  He is able to complete all ADLs independently.  He manages his own finances.  Remains on donepezil  5 mg at bedtime.  Reports that he is sleeping well.  Since he moved [facility] [facility] he has not been using his CPAP.  Reports that he is sleeping well.  No vivid dreams.  Not acting out dreams.  No hallucinations.  Overall he feels relatively stable.  His daughter does report that he has had several episodes driving that he got lost or turned around.  She does have concerns about him driving independently.  10/20/22: Robert Ramsey is a 83 y.o. male with a history of obstructive sleep apnea and memory disturbance.. Returns today for follow-up.   OSA on CPAP: Reports that CPAP is working well.    Memory: He continues to live at home alone.  He has his daughters  that check on him frequently.  He is able to complete all ADLs independently.  He manages his own finances appointments and medications.  He remains on Aricept  5 mg at bedtime.  Cannot tolerate Namenda  in the past       04/05/22:Robert Ramsey is an 83 year old male with a history of memory disturbance and obstructive sleep apnea on CPAP.  He returns today for follow-up.  In regards to his CPAP he reports it is working well.  Denies any new issues Memory: Feels that memory is stable. Living at home alone. Daughters check on him frequently. One of his daughter is having health issues which is causing him stress. Able to complete ADLs independently.able to manage finances, appointments, and medications. Couldn't tolerate namenda  felt that it made memory worse. He was taking Aricept  5 mg at bedtime but for some reason he is not taking now. Now sure why.     Robert Ramsey is an 83 year old male with a history of OSA on CPAP and memory disturbance. He returns today for followup. Here with his Daughter  Memory: Feels that memory is about the same. Lives home alone with his cat. Able to complete all ADLs independently. Manages his own medication- uses a checklist. Keeps up with his own appointments and finances. He does not feel  like there has been any big changes with his mood or behavior. Daughter feels that he gets more frustrated when he can't remember things. Discussing with his family about assistant living or retirement home.    10/01/20: Robert Ramsey is an 83 year old male with a history of obstructive sleep apnea on CPAP and memory disturbance.  He returns today for follow-up.  His CPAP download shows excellent compliance.  Denies any issues with the CPAP.  Feels that his memory has remained relatively stable.  He lives at home alone.  His daughters check on him frequently and help him with his meals.  He is able to complete all ADLs independently.  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  Returns  today for an evaluation.    04/02/20:Robert Ramsey is an 83 year old male with a history of memory disturbance and obstructive sleep apnea.  He returns today for follow-up on his memory.  He feels that overall things are stable.  He continues to live at home alone.  He does have a cat there.  He is able to complete all ADLs independently.  He completes all household chores independently.  His daughters provide his meals.  He denies any trouble sleeping.  Continues to use CPAP consistently.  Overall he feels that he is doing well.  HISTORY 10/01/19:   Robert Ramsey is an 83 year old male with a history of obstructive sleep apnea on CPAP.  His download indicates that he uses machine nightly for compliance of 100%.  He uses machine greater than 4 hours 29 days for compliance of 97%.  On average he uses his machine 8 hours and 15 minutes.  His residual AHI is 0.4 on 9 cm of water with EPR 3.  Leak in the 95th percentile is 13 L/min he reports that the CPAP is working well for him.     He feels that his memory has remained stable.  He currently lives at home alone.  He is able to complete all ADLs independently.  He operates a librarian, academic without difficulty.  He is able to manage his own finances.  His daughter reports that he is repetitive other than that he has been doing well.   He returns today for an evaluation.  REVIEW OF SYSTEMS: Out of a complete 14 system review of symptoms, the patient complains only of the following symptoms, and all other reviewed systems are negative.  ESS 6 FSS 68  ALLERGIES: Allergies  Allergen Reactions   Cinnamon Other (See Comments)    Cinnamon toothpaste caused him to break out inside his mouth and lesions were removed   Flexeril  [Cyclobenzaprine  Hcl] Other (See Comments)    Possible heart side effects   Levofloxacin Other (See Comments)    Elevated heart rate   Nabumetone Other (See Comments)    Heart issues/hospital admission   Naproxen Other (See Comments)     Effects blood pressure and heart   Nsaids Other (See Comments)    Effects blood pressure and heart   Vantin Other (See Comments)    Pseudo colitis   Vioxx [Rofecoxib] Other (See Comments)    Effects blood pressure and heart   Acyclovir And Related     Unknown reaction   Imodium [Loperamide Hcl]     Irritates Stomach     HOME MEDICATIONS: Outpatient Medications Prior to Visit  Medication Sig Dispense Refill   aspirin 81 MG tablet Take 81 mg by mouth daily.     donepezil  (ARICEPT ) 5 MG tablet Take 1  tablet (5 mg total) by mouth at bedtime. 90 tablet 2   ezetimibe  (ZETIA ) 10 MG tablet TAKE 1 TABLET BY MOUTH DAILY 90 tablet 3   famotidine (PEPCID) 20 MG tablet Take 20 mg by mouth 2 (two) times daily.     losartan  (COZAAR ) 25 MG tablet TAKE 1 TABLET  BY MOUTH DAILY. 90 tablet 3   metoprolol  succinate (TOPROL -XL) 25 MG 24 hr tablet TAKE 1 TABLET BY MOUTH DAILY 90 tablet 3   Multiple Vitamin (MULTIVITAMIN) tablet Take 1 tablet by mouth daily after supper.     Multiple Vitamins-Minerals (PRESERVISION AREDS 2 PO) Take 1 tablet by mouth 2 (two) times daily.     MYRBETRIQ 50 MG TB24 tablet Take 50 mg by mouth daily.     rosuvastatin  (CRESTOR ) 5 MG tablet TAKE ONE-HALF TABLET BY MOUTH 3  TIMES WEEKLY 20 tablet 3   sertraline  (ZOLOFT ) 25 MG tablet TAKE 1 TABLET BY MOUTH DAILY 90 tablet 3   Facility-Administered Medications Prior to Visit  Medication Dose Route Frequency Provider Last Rate Last Admin   cyanocobalamin  (VITAMIN B12) injection 1,000 mcg  1,000 mcg Intramuscular Once Perri Ronal PARAS, MD        PAST MEDICAL HISTORY: Past Medical History:  Diagnosis Date   Abdominal pain    Abnormal stress test 09/15/2010   SEPTAL DEFECT SECONDARY TO PREVIOUS INFARCT VS LBBB; MILD SEPTAL ISCHEMIA   Acid reflux    Arthritis    CAD (coronary artery disease)    cath in 2007 reveals mild diffuse CAD   Colon polyps    Complete heart block (HCC) 11/2009   has PTVP in place   Diverticulosis     Glucose intolerance (impaired glucose tolerance)    Hemorrhoids    Hiatal hernia    History of colonoscopy    HTN (hypertension)    Hypercholesterolemia    Macular degeneration    left eye.   Memory impairment    better with lower dose statin   Pacemaker    Medtronic   PVC's (premature ventricular contractions)    Sinusitis    Sleep apnea    CPAP nightly   Ventral hernia     PAST SURGICAL HISTORY: Past Surgical History:  Procedure Laterality Date   APPENDECTOMY     CARDIAC CATHETERIZATION  07/29/2005   EF 50-55%; Has mild diffuse CAD   cataract surgery  06/2009, 10/2014   Dr. Rosan   COLONOSCOPY     CPAP     EYE SURGERY     INSERT / REPLACE / REMOVE PACEMAKER  11/18/2009   w/ Dr. Tisa   LBBB     NOSE SURGERY     PACEMAKER INSERTION  11/18/09   MDT implanted by Dr Tisa   [facility] GENERATOR CHANGEOUT N/A 12/09/2023   Procedure: PPM GENERATOR CHANGEOUT;  Surgeon: Inocencio Soyla Lunger, MD;  Location: [facility] INVASIVE CV LAB;  Service: Cardiovascular;  Laterality: N/A;   REFRACTIVE SURGERY Bilateral    right nephrectomy  01/2006   partial for mass   SHOULDER ARTHROSCOPY Right 08/22/2020   Procedure: RIGHT SHOULDER ARTHROSCOPY, DEBRIDEMENT, AND DECOMPRESSION;  Surgeon: Harden Jerona GAILS, MD;  Location: [facility] OR;  Service: Orthopedics;  Laterality: Right;   TONSILLECTOMY      FAMILY HISTORY: Family History  Problem Relation Age of Onset   Leukemia Mother    Dementia Mother    Heart failure Father    Sleep apnea Neg Hx     SOCIAL HISTORY: Social History  Socioeconomic History   Marital status: Married    Spouse name: Cathlean   Number of children: 4   Years of education: 12   Highest education level: Not on file  Occupational History   Occupation: Retired    Comment: [employer]  Tobacco Use   Smoking status: Former    Current packs/day: 0.00    Types: Cigarettes    Quit date: 07/06/1983    Years since quitting: 40.9   Smokeless tobacco: Never   Tobacco comments:     stopped smoking in 1985  Vaping Use   Vaping status: Never Used  Substance and Sexual Activity   Alcohol use: No   Drug use: No   Sexual activity: Not Currently  Other Topics Concern   Not on file  Social History Narrative   Retired from environmental health practitioner.   Lives at [facility] 10[facility]04[facility]23.  Independent living.    Caffeine use- morning coffee and 2 cup/ 1 cup of tea at lunch    Social Drivers of Health   Financial Resource Strain: Low Risk  (04/06/2024)   Overall Financial Resource Strain (CARDIA)    Difficulty of Paying Living Expenses: Not very hard  Food Insecurity: No Food Insecurity (04/06/2024)   Hunger Vital Sign    Worried About Running Out of Food in the Last Year: Never true    Ran Out of Food in the Last Year: Never true  Transportation Needs: No Transportation Needs (04/06/2024)   PRAPARE - Administrator, Civil Service (Medical): No    Lack of Transportation (Non-Medical): No  Physical Activity: Inactive (04/06/2024)   Exercise Vital Sign    Days of Exercise per Week: 0 days    Minutes of Exercise per Session: 0 min  Stress: No Stress Concern Present (04/06/2024)   [employer] of Occupational Health - Occupational Stress Questionnaire    Feeling of Stress: Not at all  Social Connections: Moderately Integrated (04/06/2024)   Social Connection and Isolation Panel    Frequency of Communication with Friends and Family: More than three times a week    Frequency of Social Gatherings with Friends and Family: More than three times a week    Attends Religious Services: More than 4 times per year    Active Member of [employer] or Organizations: Yes    Attends Banker Meetings: More than 4 times per year    Marital Status: Widowed  Intimate Partner Violence: Not At Risk (04/06/2024)   Humiliation, Afraid, Rape, and Kick questionnaire    Fear of Current or Ex-Partner: No    Emotionally Abused: No    Physically Abused: No    Sexually Abused: No       PHYSICAL EXAM  Vitals:   06/06/24 1110  BP: 136/61  Pulse: 65  Weight: 188 lb (85.3 kg)  Height: 5' 6 (1.676 m)      Body mass index is 30.34 kg/m.      06/06/2024   11:13 AM 10/26/2023   11:26 AM 10/20/2022   10:42 AM  MMSE - Mini Mental State Exam  Orientation to time 1 2 3   Orientation to Place 5 4 5   Registration 3 3 3   Attention/ Calculation 2 3 3   Recall 0 0 0  Language- name 2 objects 2 2 2   Language- repeat 0 0 0  Language- follow 3 step command 3 3 3   Language- read & follow direction 1 1 1   Write a sentence 1 1 0  Copy design 0 1 1  Total score 18 20 21      Generalized: Well developed, in no acute distress   Neurological examination  Mentation: Alert oriented to time, place, history taking. Follows all commands speech and language fluent Cranial nerve II-XII: Pupils were equal round reactive to light. Extraocular movements were full, visual field were full on confrontational test.  Head turning and shoulder shrug  were normal and symmetric. Motor: The motor testing reveals 5 over 5 strength of all 4 extremities. Good symmetric motor tone is noted throughout.  Sensory: Sensory testing is intact to soft touch on all 4 extremities. No evidence of extinction is noted.  Coordination: Cerebellar testing reveals good finger-nose-finger and heel-to-shin bilaterally.  Gait and station: Gait is normal.  Reflexes: Deep tendon reflexes are symmetric and normal bilaterally.   DIAGNOSTIC DATA (LABS, IMAGING, TESTING) - I reviewed patient records, labs, notes, testing and imaging myself where available.  Lab Results  Component Value Date   WBC 5.9 04/05/2024   HGB 13.5 04/05/2024   HCT 41.6 04/05/2024   MCV 98.6 04/05/2024   PLT 240 04/05/2024      Component Value Date/Time   NA 142 04/05/2024 0952   NA 143 11/22/2023 1037   K 4.9 04/05/2024 0952   CL 107 04/05/2024 0952   CO2 27 04/05/2024 0952   GLUCOSE 92 04/05/2024 0952   BUN 23 04/05/2024 0952    BUN 20 11/22/2023 1037   CREATININE 1.04 04/05/2024 0952   CALCIUM  9.9 04/05/2024 0952   PROT 6.6 04/05/2024 0952   PROT 6.2 10/01/2019 1113   ALBUMIN 4.2 10/01/2019 1113   AST 16 04/05/2024 0952   ALT 16 04/05/2024 0952   ALKPHOS 57 10/01/2019 1113   BILITOT 0.5 04/05/2024 0952   BILITOT 0.3 10/01/2019 1113   GFRNONAA >60 08/20/2020 1143   GFRNONAA 67 03/27/2020 0910   GFRAA 78 03/27/2020 0910   Lab Results  Component Value Date   CHOL 169 04/05/2024   HDL 50 04/05/2024   LDLCALC 96 04/05/2024   TRIG 135 04/05/2024   CHOLHDL 3.4 04/05/2024   Lab Results  Component Value Date   HGBA1C 5.6 04/05/2024   Lab Results  Component Value Date   VITAMINB12 1,038 06/23/2023   Lab Results  Component Value Date   TSH 1.53 12/11/2018      ASSESSMENT AND PLAN 83 y.o. year old male  has a past medical history of Abdominal pain, Abnormal stress test (09/15/2010), Acid reflux, Arthritis, CAD (coronary artery disease), Colon polyps, Complete heart block (HCC) (11/2009), Diverticulosis, Glucose intolerance (impaired glucose tolerance), Hemorrhoids, Hiatal hernia, History of colonoscopy, HTN (hypertension), Hypercholesterolemia, Macular degeneration, Memory impairment, Pacemaker, PVC's (premature ventricular contractions), Sinusitis, Sleep apnea, and Ventral hernia. here with:  1.  Memory disturbance  Memory score MMSE 18/30 slightly decreased. Continue Aricept  5 mg at bedtime refilled by PCP We did discuss potentially adding on Namenda .  I provided him some information about the medication on his AVS.   2.  Obstructive sleep apnea on CPAP  Advised patient continue using CPAP nightly greater than 4 hours each night Encouraged him to bring his machine to the next visit we will do a download in   Follow-up in 6-8 months or sooner if needed   Duwaine Russell, MSN, NP-C 06/06/2024, 11:20 AM [facility] [address], [address] [place], [place] [address] [phone]  The patient's condition requires frequent monitoring and adjustments in the treatment plan, reflecting the ongoing complexity of  care.  This provider is the continuing focal point for all needed services for this condition.

## 2024-06-06 NOTE — Patient Instructions (Addendum)
 Your Plan:  Continue using CPAP nightly  Consider adding on Namenda  for memory.  I have attached some information for you to read over  Thank you for coming to see us  at [facility]. I hope we have been able to provide you high quality care today.  You may receive a patient satisfaction survey over the next few weeks. We would appreciate your feedback and comments so that we may continue to improve ourselves and the health of our patients.

## 2024-06-11 ENCOUNTER — Ambulatory Visit

## 2024-06-12 LAB — CUP PACEART REMOTE DEVICE CHECK
Battery Remaining Longevity: 156 mo
Battery Remaining Percentage: 100 %
Brady Statistic RA Percent Paced: 15 %
Brady Statistic RV Percent Paced: 99 %
Date Time Interrogation Session: 20251208003100
Implantable Lead Connection Status: 753985
Implantable Lead Connection Status: 753985
Implantable Lead Implant Date: 20110517
Implantable Lead Implant Date: 20110517
Implantable Lead Location: 753859
Implantable Lead Location: 753860
Implantable Lead Model: 4469
Implantable Lead Model: 4470
Implantable Lead Serial Number: 543103
Implantable Lead Serial Number: 672341
Implantable Pulse Generator Implant Date: 20250606
Lead Channel Impedance Value: 560 Ohm
Lead Channel Impedance Value: 666 Ohm
Lead Channel Pacing Threshold Amplitude: 0.6 V
Lead Channel Pacing Threshold Pulse Width: 0.4 ms
Lead Channel Setting Pacing Amplitude: 1.1 V
Lead Channel Setting Pacing Amplitude: 2.5 V
Lead Channel Setting Pacing Pulse Width: 0.4 ms
Lead Channel Setting Sensing Sensitivity: 3.5 mV
Pulse Gen Serial Number: 142906
Zone Setting Status: 755011

## 2024-06-13 ENCOUNTER — Ambulatory Visit: Payer: Self-pay | Admitting: Cardiology

## 2024-06-13 ENCOUNTER — Ambulatory Visit: Payer: Medicare Other

## 2024-06-13 VITALS — BP 110/70 | HR 72 | Ht 67.0 in | Wt 189.0 lb

## 2024-06-13 DIAGNOSIS — E538 Deficiency of other specified B group vitamins: Secondary | ICD-10-CM

## 2024-06-13 MED ORDER — CYANOCOBALAMIN 1000 MCG/ML IJ SOLN
1000.0000 ug | Freq: Once | INTRAMUSCULAR | Status: AC
  Administered 2024-06-13: 1000 ug via INTRAMUSCULAR

## 2024-06-13 MED ORDER — DONEPEZIL HCL 5 MG PO TABS: 5.0000 mg | ORAL_TABLET | Freq: Every day | ORAL | 2 refills | Status: AC

## 2024-06-13 NOTE — Progress Notes (Signed)
 Patient presents to the clinic for a monthly B12 injection for history of B12 deficiency.   1 cc IM Vitamin B12 given by CMA, left upper quadrant. Patient tolerated well.   RTC in one month for another injection.

## 2024-06-19 NOTE — Progress Notes (Signed)
 Remote PPM Transmission

## 2024-07-18 ENCOUNTER — Ambulatory Visit: Payer: Medicare Other

## 2024-07-18 VITALS — BP 130/80 | HR 66 | Ht 68.0 in | Wt 188.0 lb

## 2024-07-18 DIAGNOSIS — E538 Deficiency of other specified B group vitamins: Secondary | ICD-10-CM | POA: Diagnosis not present

## 2024-07-18 MED ORDER — CYANOCOBALAMIN 1000 MCG/ML IJ SOLN
1000.0000 ug | Freq: Once | INTRAMUSCULAR | Status: AC
  Administered 2024-07-18: 1000 ug via INTRAMUSCULAR

## 2024-07-18 NOTE — Progress Notes (Signed)
 Patient presents to the clinic for a monthly B12 injection for history of B12 deficiency.   1 cc IM Vitamin B12 given by CMA, left upper quadrant. Patient tolerated well.   RTC in one month for another injection.

## 2024-08-15 ENCOUNTER — Ambulatory Visit: Payer: Medicare Other

## 2024-09-10 ENCOUNTER — Encounter

## 2024-09-12 ENCOUNTER — Ambulatory Visit: Payer: Medicare Other

## 2024-12-10 ENCOUNTER — Encounter

## 2025-01-10 ENCOUNTER — Ambulatory Visit: Admitting: Adult Health

## 2025-03-12 ENCOUNTER — Encounter

## 2025-06-11 ENCOUNTER — Encounter

## 2025-09-10 ENCOUNTER — Encounter

## 2025-12-10 ENCOUNTER — Encounter
